# Patient Record
Sex: Male | Born: 1975 | Race: White | Hispanic: Yes | Marital: Single | Smoking: Light tobacco smoker
Health system: Southern US, Community
[De-identification: ages and names within clinical notes are randomized; demographics above are authoritative.]

## PROBLEM LIST (undated history)

## (undated) DIAGNOSIS — M999 Biomechanical lesion, unspecified: Secondary | ICD-10-CM

## (undated) DIAGNOSIS — F1910 Other psychoactive substance abuse, uncomplicated: Secondary | ICD-10-CM

## (undated) DIAGNOSIS — L820 Inflamed seborrheic keratosis: Secondary | ICD-10-CM

## (undated) DIAGNOSIS — K649 Unspecified hemorrhoids: Secondary | ICD-10-CM

## (undated) DIAGNOSIS — F329 Major depressive disorder, single episode, unspecified: Secondary | ICD-10-CM

## (undated) DIAGNOSIS — G2589 Other specified extrapyramidal and movement disorders: Secondary | ICD-10-CM

## (undated) DIAGNOSIS — J302 Other seasonal allergic rhinitis: Secondary | ICD-10-CM

## (undated) DIAGNOSIS — A691 Other Vincent's infections: Secondary | ICD-10-CM

## (undated) DIAGNOSIS — M25512 Pain in left shoulder: Secondary | ICD-10-CM

## (undated) DIAGNOSIS — F1911 Other psychoactive substance abuse, in remission: Secondary | ICD-10-CM

## (undated) DIAGNOSIS — Z720 Tobacco use: Secondary | ICD-10-CM

## (undated) DIAGNOSIS — Z202 Contact with and (suspected) exposure to infections with a predominantly sexual mode of transmission: Secondary | ICD-10-CM

## (undated) DIAGNOSIS — N529 Male erectile dysfunction, unspecified: Secondary | ICD-10-CM

## (undated) DIAGNOSIS — B359 Dermatophytosis, unspecified: Secondary | ICD-10-CM

## (undated) DIAGNOSIS — R6882 Decreased libido: Secondary | ICD-10-CM

## (undated) DIAGNOSIS — K140 Glossitis: Secondary | ICD-10-CM

## (undated) DIAGNOSIS — Z21 Asymptomatic human immunodeficiency virus [HIV] infection status: Secondary | ICD-10-CM

## (undated) DIAGNOSIS — B20 Human immunodeficiency virus [HIV] disease: Secondary | ICD-10-CM

## (undated) DIAGNOSIS — F411 Generalized anxiety disorder: Secondary | ICD-10-CM

## (undated) DIAGNOSIS — K6289 Other specified diseases of anus and rectum: Secondary | ICD-10-CM

## (undated) DIAGNOSIS — J069 Acute upper respiratory infection, unspecified: Secondary | ICD-10-CM

## (undated) DIAGNOSIS — H60399 Other infective otitis externa, unspecified ear: Secondary | ICD-10-CM

## (undated) DIAGNOSIS — IMO0002 Reserved for concepts with insufficient information to code with codable children: Secondary | ICD-10-CM

## (undated) DIAGNOSIS — L738 Other specified follicular disorders: Secondary | ICD-10-CM

## (undated) DIAGNOSIS — R000 Tachycardia, unspecified: Secondary | ICD-10-CM

## (undated) DIAGNOSIS — B370 Candidal stomatitis: Secondary | ICD-10-CM

## (undated) DIAGNOSIS — S4350XA Sprain of unspecified acromioclavicular joint, initial encounter: Secondary | ICD-10-CM

## (undated) DIAGNOSIS — L290 Pruritus ani: Secondary | ICD-10-CM

## (undated) HISTORY — DX: Candidal stomatitis: B37.0

## (undated) HISTORY — DX: Other psychoactive substance abuse, in remission: F19.11

## (undated) HISTORY — DX: Unspecified hemorrhoids: K64.9

## (undated) HISTORY — DX: Pruritus ani: L29.0

## (undated) HISTORY — DX: Acute upper respiratory infection, unspecified: J06.9

## (undated) HISTORY — DX: Pain in left shoulder: M25.512

## (undated) HISTORY — DX: Inflamed seborrheic keratosis: L82.0

## (undated) HISTORY — DX: Tachycardia, unspecified: R00.0

## (undated) HISTORY — DX: Dermatophytosis, unspecified: B35.9

## (undated) HISTORY — DX: Other infective otitis externa, unspecified ear: H60.399

## (undated) HISTORY — DX: Other seasonal allergic rhinitis: J30.2

## (undated) HISTORY — DX: Reserved for concepts with insufficient information to code with codable children: IMO0002

## (undated) HISTORY — DX: Other psychoactive substance abuse, uncomplicated: F19.10

## (undated) HISTORY — DX: Other specified diseases of anus and rectum: K62.89

## (undated) HISTORY — DX: Sprain of unspecified acromioclavicular joint, initial encounter: S43.50XA

## (undated) HISTORY — DX: Asymptomatic human immunodeficiency virus (hiv) infection status: Z21

## (undated) HISTORY — DX: Contact with and (suspected) exposure to infections with a predominantly sexual mode of transmission: Z20.2

## (undated) HISTORY — DX: Generalized anxiety disorder: F41.1

## (undated) HISTORY — DX: Other specified extrapyramidal and movement disorders: G25.89

## (undated) HISTORY — PX: TESTICLE TORSION REDUCTION: SHX795

## (undated) HISTORY — DX: Decreased libido: R68.82

## (undated) HISTORY — DX: Tobacco use: Z72.0

## (undated) HISTORY — DX: Glossitis: K14.0

## (undated) HISTORY — DX: Major depressive disorder, single episode, unspecified: F32.9

## (undated) HISTORY — DX: Male erectile dysfunction, unspecified: N52.9

## (undated) HISTORY — DX: Human immunodeficiency virus (HIV) disease: B20

## (undated) HISTORY — DX: Other Vincent's infections: A69.1

## (undated) HISTORY — DX: Biomechanical lesion, unspecified: M99.9

## (undated) HISTORY — DX: Other specified follicular disorders: L73.8

---

## 2009-02-28 ENCOUNTER — Encounter: Payer: Self-pay | Admitting: Internal Medicine

## 2009-02-28 LAB — CONVERTED CEMR LAB
HIV 1 RNA Quant: 29600 copies/mL
Hemoglobin: 15.1 g/dL
Hep B S Ab: REACTIVE
RPR Ser Ql: NONREACTIVE
WBC: 5.2 10*3/uL

## 2010-01-18 ENCOUNTER — Ambulatory Visit (HOSPITAL_COMMUNITY): Admission: EM | Admit: 2010-01-18 | Discharge: 2010-01-18 | Payer: Self-pay | Admitting: Emergency Medicine

## 2010-02-23 ENCOUNTER — Encounter: Payer: Self-pay | Admitting: Internal Medicine

## 2010-02-23 DIAGNOSIS — B20 Human immunodeficiency virus [HIV] disease: Secondary | ICD-10-CM | POA: Insufficient documentation

## 2010-02-24 ENCOUNTER — Ambulatory Visit: Payer: Self-pay | Admitting: Internal Medicine

## 2010-02-24 LAB — CONVERTED CEMR LAB
AST: 23 units/L (ref 0–37)
Alkaline Phosphatase: 68 units/L (ref 39–117)
CO2: 26 meq/L (ref 19–32)
Calcium: 9.1 mg/dL (ref 8.4–10.5)
Creatinine, Ser: 0.69 mg/dL (ref 0.40–1.50)
HCT: 46 % (ref 39.0–52.0)
HCV Ab: NEGATIVE
HDL: 47 mg/dL (ref >39)
HIV-1 antibody: POSITIVE — AB
HIV-2 Ab: NEGATIVE
Hemoglobin: 15.8 g/dL (ref 13.0–17.0)
Hepatitis B Surface Ag: NEGATIVE
Leukocytes, UA: NEGATIVE
MCV: 89.5 fL (ref 78.0–100.0)
Monocytes Relative: 10 % (ref 3–12)
Neutro Abs: 1.7 10*3/uL (ref 1.7–7.7)
Neutrophils Relative %: 37 % — ABNORMAL LOW (ref 43–77)
Potassium: 4 meq/L (ref 3.5–5.3)
Protein, ur: NEGATIVE mg/dL
RBC: 5.14 M/uL (ref 4.22–5.81)
RDW: 13.8 % (ref 11.5–15.5)
Total Bilirubin: 0.5 mg/dL (ref 0.3–1.2)
Total CHOL/HDL Ratio: 3.9
Urobilinogen, UA: 0.2 (ref 0.0–1.0)
pH: 8 (ref 5.0–8.0)

## 2010-02-28 ENCOUNTER — Encounter: Payer: Self-pay | Admitting: Internal Medicine

## 2010-03-09 ENCOUNTER — Ambulatory Visit: Payer: Self-pay | Admitting: Internal Medicine

## 2010-05-31 ENCOUNTER — Ambulatory Visit: Payer: Self-pay | Admitting: Internal Medicine

## 2010-05-31 LAB — CONVERTED CEMR LAB
ALT: 23 units/L (ref 0–53)
Albumin: 4.3 g/dL (ref 3.5–5.2)
Alkaline Phosphatase: 70 units/L (ref 39–117)
Basophils Relative: 0 % (ref 0–1)
Calcium: 9.4 mg/dL (ref 8.4–10.5)
Chloride: 98 meq/L (ref 96–112)
HCT: 42.7 % (ref 39.0–52.0)
Lymphs Abs: 2.9 10*3/uL (ref 0.7–4.0)
MCHC: 34.2 g/dL (ref 30.0–36.0)
MCV: 91.8 fL (ref 78.0–100.0)
Monocytes Absolute: 0.4 10*3/uL (ref 0.1–1.0)
Monocytes Relative: 9 % (ref 3–12)
Neutrophils Relative %: 29 % — ABNORMAL LOW (ref 43–77)
Platelets: 261 10*3/uL (ref 150–400)
RDW: 13.6 % (ref 11.5–15.5)
Total Protein: 7.9 g/dL (ref 6.0–8.3)
WBC: 4.9 10*3/uL (ref 4.0–10.5)

## 2010-06-14 ENCOUNTER — Ambulatory Visit: Payer: Self-pay | Admitting: Internal Medicine

## 2010-06-14 DIAGNOSIS — H60399 Other infective otitis externa, unspecified ear: Secondary | ICD-10-CM

## 2010-06-14 HISTORY — DX: Other infective otitis externa, unspecified ear: H60.399

## 2010-07-06 ENCOUNTER — Encounter (INDEPENDENT_AMBULATORY_CARE_PROVIDER_SITE_OTHER): Payer: Self-pay | Admitting: *Deleted

## 2010-10-02 ENCOUNTER — Telehealth: Payer: Self-pay | Admitting: Internal Medicine

## 2010-10-03 ENCOUNTER — Ambulatory Visit: Payer: Self-pay | Admitting: Adult Health

## 2010-10-03 ENCOUNTER — Encounter: Payer: Self-pay | Admitting: Internal Medicine

## 2010-10-03 ENCOUNTER — Encounter: Payer: Self-pay | Admitting: Adult Health

## 2010-10-03 DIAGNOSIS — K140 Glossitis: Secondary | ICD-10-CM

## 2010-10-03 DIAGNOSIS — A691 Other Vincent's infections: Secondary | ICD-10-CM

## 2010-10-03 DIAGNOSIS — K6289 Other specified diseases of anus and rectum: Secondary | ICD-10-CM | POA: Insufficient documentation

## 2010-10-03 HISTORY — DX: Other Vincent's infections: A69.1

## 2010-10-03 HISTORY — DX: Other specified diseases of anus and rectum: K62.89

## 2010-10-03 HISTORY — DX: Glossitis: K14.0

## 2010-10-03 LAB — CONVERTED CEMR LAB
ALT: 18 units/L (ref 0–53)
AST: 21 units/L (ref 0–37)
Albumin: 3.9 g/dL (ref 3.5–5.2)
Alkaline Phosphatase: 68 units/L (ref 39–117)
BUN: 13 mg/dL (ref 6–23)
Basophils Relative: 0 % (ref 0–1)
Chloride: 100 meq/L (ref 96–112)
Creatinine, Ser: 0.71 mg/dL (ref 0.40–1.50)
Glucose, Bld: 58 mg/dL — ABNORMAL LOW (ref 70–99)
HIV-1 RNA Quant, Log: 5.1 — ABNORMAL HIGH (ref <1.30)
Hemoglobin: 14.8 g/dL (ref 13.0–17.0)
Lymphs Abs: 1.4 10*3/uL (ref 0.7–4.0)
Monocytes Relative: 11 % (ref 3–12)
Neutro Abs: 1.7 10*3/uL (ref 1.7–7.7)
Neutrophils Relative %: 47 % (ref 43–77)
Platelets: 288 10*3/uL (ref 150–400)
RDW: 13.6 % (ref 11.5–15.5)
Total Protein: 8 g/dL (ref 6.0–8.3)

## 2010-10-11 ENCOUNTER — Ambulatory Visit
Admission: RE | Admit: 2010-10-11 | Discharge: 2010-10-11 | Payer: Self-pay | Source: Home / Self Care | Attending: Adult Health | Admitting: Adult Health

## 2010-10-11 DIAGNOSIS — L820 Inflamed seborrheic keratosis: Secondary | ICD-10-CM

## 2010-10-11 DIAGNOSIS — B370 Candidal stomatitis: Secondary | ICD-10-CM

## 2010-10-11 HISTORY — DX: Candidal stomatitis: B37.0

## 2010-10-11 HISTORY — DX: Inflamed seborrheic keratosis: L82.0

## 2010-10-19 ENCOUNTER — Encounter: Payer: Self-pay | Admitting: Internal Medicine

## 2010-10-24 ENCOUNTER — Ambulatory Visit
Admission: RE | Admit: 2010-10-24 | Discharge: 2010-10-24 | Payer: Self-pay | Source: Home / Self Care | Attending: Adult Health | Admitting: Adult Health

## 2010-10-24 DIAGNOSIS — L738 Other specified follicular disorders: Secondary | ICD-10-CM

## 2010-10-24 DIAGNOSIS — L290 Pruritus ani: Secondary | ICD-10-CM

## 2010-10-24 DIAGNOSIS — L678 Other hair color and hair shaft abnormalities: Secondary | ICD-10-CM

## 2010-10-24 DIAGNOSIS — B359 Dermatophytosis, unspecified: Secondary | ICD-10-CM

## 2010-10-24 HISTORY — DX: Other hair color and hair shaft abnormalities: L67.8

## 2010-10-24 HISTORY — DX: Dermatophytosis, unspecified: B35.9

## 2010-10-24 HISTORY — DX: Pruritus ani: L29.0

## 2010-10-24 HISTORY — DX: Other specified follicular disorders: L73.8

## 2010-10-30 ENCOUNTER — Telehealth (INDEPENDENT_AMBULATORY_CARE_PROVIDER_SITE_OTHER): Payer: Self-pay | Admitting: *Deleted

## 2010-10-31 ENCOUNTER — Ambulatory Visit
Admission: RE | Admit: 2010-10-31 | Discharge: 2010-10-31 | Payer: Self-pay | Source: Home / Self Care | Attending: Adult Health | Admitting: Adult Health

## 2010-10-31 DIAGNOSIS — F329 Major depressive disorder, single episode, unspecified: Secondary | ICD-10-CM

## 2010-10-31 DIAGNOSIS — J069 Acute upper respiratory infection, unspecified: Secondary | ICD-10-CM

## 2010-10-31 HISTORY — DX: Major depressive disorder, single episode, unspecified: F32.9

## 2010-10-31 HISTORY — DX: Acute upper respiratory infection, unspecified: J06.9

## 2010-11-06 ENCOUNTER — Telehealth: Payer: Self-pay | Admitting: Adult Health

## 2010-11-13 ENCOUNTER — Encounter: Payer: Self-pay | Admitting: Adult Health

## 2010-11-13 ENCOUNTER — Ambulatory Visit
Admission: RE | Admit: 2010-11-13 | Discharge: 2010-11-13 | Payer: Self-pay | Source: Home / Self Care | Attending: Adult Health | Admitting: Adult Health

## 2010-11-13 LAB — CONVERTED CEMR LAB
ALT: 24 units/L (ref 0–53)
AST: 28 units/L (ref 0–37)
Alkaline Phosphatase: 66 units/L (ref 39–117)
BUN: 11 mg/dL (ref 6–23)
Basophils Absolute: 0 10*3/uL (ref 0.0–0.1)
Basophils Relative: 0 % (ref 0–1)
CO2: 34 meq/L — ABNORMAL HIGH (ref 19–32)
Calcium: 9.7 mg/dL (ref 8.4–10.5)
Chloride: 96 meq/L (ref 96–112)
Creatinine, Ser: 0.76 mg/dL (ref 0.40–1.50)
Glucose, Bld: 107 mg/dL — ABNORMAL HIGH (ref 70–99)
HIV 1 RNA Quant: 56 copies/mL — ABNORMAL HIGH (ref <20)
Lymphocytes Relative: 45 % (ref 12–46)
Lymphs Abs: 2.2 10*3/uL (ref 0.7–4.0)
MCV: 90.2 fL (ref 78.0–100.0)
Monocytes Absolute: 0.3 10*3/uL (ref 0.1–1.0)
Neutro Abs: 1.9 10*3/uL (ref 1.7–7.7)
Sodium: 136 meq/L (ref 135–145)
Total Bilirubin: 0.4 mg/dL (ref 0.3–1.2)

## 2010-11-14 LAB — T-HELPER CELL (CD4) - (RCID CLINIC ONLY)
CD4 % Helper T Cell: 13 % — ABNORMAL LOW (ref 33–55)
CD4 T Cell Abs: 300 uL — ABNORMAL LOW (ref 400–2700)

## 2010-11-14 NOTE — Consult Note (Signed)
Summary: New Pt. Referral:  [facility] ID  New Pt. Referral:  [facility] ID   Imported By: Florinda Marker 03/15/2010 10:53:46  _____________________________________________________________________  External Attachment:    Type:   Image     Comment:   External Document

## 2010-11-14 NOTE — Miscellaneous (Signed)
Summary: HIPAA Restrictions  HIPAA Restrictions   Imported By: Florinda Marker 02/24/2010 15:30:31  _____________________________________________________________________  External Attachment:    Type:   Image     Comment:   External Document

## 2010-11-14 NOTE — Assessment & Plan Note (Signed)
Summary: f/u on labs/jc   CC:  follow-up visit and lab results.  History of Present Illness: Pt here to get the results of his blood work.  He c/o some drainage from his right ear.  No pain. He started a new regimen of vitamins for his HIV and does not want to start antiretrovirals at this time.  He would like to see if the vitamins are effective first.  Preventive Screening-Counseling & Management  Alcohol-Tobacco     Alcohol drinks/day: socially     Alcohol type: wine     Smoking Status: current     Packs/Day: 0.5     Year Started: 1999  Caffeine-Diet-Exercise     Caffeine use/day: coffee and tea      Does Patient Exercise: yes     Type of exercise: yoga, walking, jogging     Exercise (avg: min/session): >60     Times/week: 3  Safety-Violence-Falls     Seat Belt Use: yes      Sexual History:  n/a.        Drug Use:  former and heroin.        Blood Transfusions:  no.    Comments: pt. given condoms   Updated Prior Medication List: CORTISPORIN 3.5-10000-1 SOLN (NEOMYCIN-POLYMYXIN-HC) one drop 4 times a day  Current Allergies (reviewed today): No known allergies  Review of Systems  The patient denies fever, decreased hearing, and headaches.    Vital Signs:  Patient profile:   35 year old male Height:      67.5 inches (171.45 cm) Weight:      123.0 pounds (55.91 kg) BMI:     19.05 Temp:     97.5 degrees F (36.39 degrees C) oral Pulse rate:   73 / minute BP sitting:   123 / 78  (right arm)  Vitals Entered By: Wendall Mola CMA Duncan Dull) (June 14, 2010 10:07 AM) CC: follow-up visit, lab results Is Patient Diabetic? No Pain Assessment Patient in pain? no      Nutritional Status BMI of 19 -24 = normal Nutritional Status Detail appetite "good"  Does patient need assistance? Functional Status Self care Ambulation Normal Comments pt. started an herbal regimen of about 10 pills one month ago   Physical Exam  General:  alert, well-developed,  well-nourished, and well-hydrated.   Head:  normocephalic and atraumatic.   Ears:  R canal inflamed and R Canal drainage.   Lungs:  normal breath sounds.             Prevention For Positives: 06/14/2010   Safe sex practices discussed with patient. Condoms offered.                             Impression & Recommendations:  Problem # 1:  HIV INFECTION (ICD-042) Pt will return in 3 months for repeat labs.  He does not want to start antiretroviral therapy at this time.  Pneumovax refused. Diagnostics Reviewed:  HIV: REACTIVE (02/24/2010)   HIV-Western blot: Positive (02/24/2010)   CD4: 310 (06/01/2010)   WBC: 4.9 (05/31/2010)   Hgb: 14.6 (05/31/2010)   HCT: 42.7 (05/31/2010)   Platelets: 261 (05/31/2010) HIV genotype: See Comment (02/24/2010)   HIV-1 RNA: 175000 (05/31/2010)   HBSAg: NEG (02/24/2010)  Orders: Est. Patient Level III (99213)Future Orders: T-CD4SP ([facility]) (CD4SP) ... 09/12/2010 T-HIV Viral Load [id number]) ... 09/12/2010 T-Comprehensive Metabolic Panel [phone] [phone]) ... 09/12/2010 T-CBC w/Diff ([id number]) ... 09/12/2010  Problem # 2:  OTITIS EXTERNA (ICD-380.10)  His updated medication list for this problem includes:    Cortisporin 3.5-10000-1 Soln (Neomycin-polymyxin-hc) ..... One drop 4 times a day  Medications Added to Medication List This Visit: 1)  Cortisporin 3.5-10000-1 Soln (Neomycin-polymyxin-hc) .... One drop 4 times a day  Patient Instructions: 1)  Please schedule a follow-up appointment in 3 months, 2 weeks after labs.  Prescriptions: CORTISPORIN 3.5-10000-1 SOLN (NEOMYCIN-POLYMYXIN-HC) one drop 4 times a day  #44ml x 0   Entered and Authorized by:   Yisroel Ramming MD   Signed by:   Yisroel Ramming MD on 06/14/2010   Method used:   Print then Give to Patient   RxID:   [id number]
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
## 2010-11-14 NOTE — Miscellaneous (Signed)
Summary: Lab order/update  Clinical Lists Changes  Problems: Added new problem of HIV INFECTION (ICD-042) Orders: Added new Test order of T-Comprehensive Metabolic Panel [id number]) - Signed Added new Test order of T-CBC w/Diff [phone]) - Signed Added new Test order of T-CD4SP [facility]) (CD4SP) - Signed Added new Test order of T-HIV Viral Load [phone]) - Signed Added new Test order of T-Chlamydia  Probe, urine [id number]) - Signed Added new Test order of T-GC Probe, urine [phone]) - Signed Added new Test order of T-Hepatitis B Surface Antigen [phone]) - Signed Added new Test order of T-Hepatitis B Surface Antibody [id number]) - Signed Added new Test order of T-Hepatitis B Core Antibody ([id number]) - Signed Added new Test order of T-Hepatitis C Antibody ([id number]) - Signed Added new Test order of T-HIV Antibody  (Reflex) ([id number]) - Signed Added new Test order of T-HIV Ab Confirmatory Test/Western Blot ([id number]) - Signed Added new Test order of T-Lipid Profile ([id number]) - Signed Added new Test order of T-RPR (Syphilis) ([id number]) - Signed Added new Test order of T-Urinalysis ([id number]) - Signed Added new Test order of T-Hepatitis A Antibody ([id number]) - Signed Added new Test order of T-HIV1 Quant rflx Ultra or Genotype ([id number]) - Signed Observations: Added new observation of ANTI-HBS: reactive  (02/28/2009 14:13) Added new observation of GLUCOSE SER: 76 mg/dL (07/31/5101 58:52) Added new observation of ANTI-HBS: non reactive   (02/28/2009 14:10) Added new observation of HBSAG: non reactive   (02/28/2009 14:10) Added new observation of RPR: non reactive   (02/28/2009 14:10) Added new observation of WBC COUNT: 5.2 10*3/microliter (02/28/2009 14:10) Added new observation of HGB: 15.1 g/dL (77/82/4235 36:14) Added new observation of HIV1RNA QA: 29600 copies/mL (02/28/2009 14:10)         -  Date:  02/28/2009    Viral Load:  29600    Hemoglobin: 15.1    WBC: 5.2    RPR: non reactive     Hep B Surface Antigens: non reactive     Hep B Surface Antibodies: reactive     Hep B Surface Antibodies: non reactive     Glucose: 76

## 2010-11-14 NOTE — Miscellaneous (Signed)
Summary: RW update  Clinical Lists Changes  Observations: Added new observation of RWPARTICIP: Yes (07/06/2010 10:38)

## 2010-11-16 NOTE — Assessment & Plan Note (Signed)
Summary: Office Visit - Infectious Disease   History of Present Illness: Presents with c/o irritated "bump" in perianal region for past 2 weeks.  Denies drainage or pain at present.  Also c/o "itching ears" bilaterally, no drainage, bleeding or pain; denies tinnitus or decreased hearing acuity. Was diagnosed with ulcerative gingivitis and thinks he has thrush in hismouth.  Scheduled to see periodontist this week.  Has been off ARV therapy for "some time" having used "alternative therapies" to treat his HIV.    Current Allergies: No known allergies  Review of Systems General:  Denies chills, fatigue, fever, loss of appetite, malaise, sleep disorder, sweats, weakness, and weight loss. Eyes:  Denies blurring, discharge, double vision, eye irritation, eye pain, halos, itching, light sensitivity, red eye, vision loss-1 eye, and vision loss-both eyes. ENT:  Complains of ear discharge and earache; denies decreased hearing, difficulty swallowing, hoarseness, nasal congestion, nosebleeds, postnasal drainage, ringing in ears, sinus pressure, and sore throat; oral pain with "bumps" on tongue, some odynophagia.  c/o severe dental and gum pain. CV:  Denies bluish discoloration of lips or nails, chest pain or discomfort, difficulty breathing at night, difficulty breathing while lying down, fainting, fatigue, leg cramps with exertion, lightheadness, near fainting, palpitations, shortness of breath with exertion, swelling of feet, swelling of hands, and weight gain. Resp:  Denies chest discomfort, chest pain with inspiration, cough, coughing up blood, excessive snoring, hypersomnolence, morning headaches, pleuritic, shortness of breath, sputum productive, and wheezing. GI:  See HPI; Denies abdominal pain, bloody stools, change in bowel habits, constipation, dark tarry stools, diarrhea, excessive appetite, gas, hemorrhoids, indigestion, loss of appetite, nausea, vomiting, vomiting blood, and yellowish skin color. GU:   Denies decreased libido, discharge, dysuria, erectile dysfunction, genital sores, hematuria, incontinence, nocturia, urinary frequency, and urinary hesitancy. MS:  Denies joint pain, joint redness, joint swelling, loss of strength, low back pain, mid back pain, muscle aches, muscle , cramps, muscle weakness, stiffness, and thoracic pain. Derm:  Denies changes in color of skin, changes in nail beds, dryness, excessive perspiration, flushing, hair loss, insect bite(s), itching, lesion(s), poor wound healing, and rash. Neuro:  Denies brief paralysis, difficulty with concentration, disturbances in coordination, falling down, headaches, inability to speak, memory loss, numbness, poor balance, seizures, sensation of room spinning, tingling, tremors, visual disturbances, and weakness. Psych:  Denies alternate hallucination ( auditory/visual), anxiety, depression, easily angered, easily tearful, irritability, mental problems, panic attacks, sense of great danger, suicidal thoughts/plans, thoughts of violence, unusual visions or sounds, and thoughts /plans of harming others.  Physical Exam  General:  alert, well-developed, well-hydrated, appropriate dress, normal appearance, cooperative to examination, good hygiene, and underweight appearing.   Head:  Normocephalic and atraumatic without obvious abnormalities. No apparent alopecia or balding. Eyes:  No corneal or conjunctival inflammation noted. EOMI. Perrla. Funduscopic exam benign, without hemorrhages, exudates or papilledema. Vision grossly normal. Ears:  Canals both ears reddened but no exudates or drainage noted.  TM's clear Nose:  External nasal examination shows no deformity or inflammation. Nasal mucosa are pink and moist without lesions or exudates. Mouth:  Marked gingival ulceration and hypertrophy with dental roots showing.  halitosis, xerostomia, gingival inflammation, gingival recession, and glossitis.   Neck:  No deformities, masses, or  tenderness noted. Lungs:  Normal respiratory effort, chest expands symmetrically. Lungs are clear to auscultation, no crackles or wheezes. Heart:  Normal rate and regular rhythm. S1 and S2 normal without gallop, murmur, click, rub or other extra sounds. Abdomen:  Bowel sounds positive,abdomen soft and non-tender without  masses, organomegaly or hernias noted. Rectal:  Very small follicular lesion noted in poerianal area.  No hemorrhoids or verrucacious lesions noted. Genitalia:  Testes bilaterally descended without nodularity, tenderness or masses. No scrotal masses or lesions. No penis lesions or urethral discharge. Prostate:  Deferred Msk:  No deformity or scoliosis noted of thoracic or lumbar spine.   Pulses:  R and L carotid,radial,femoral,dorsalis pedis and posterior tibial pulses are full and equal bilaterally Extremities:  No clubbing, cyanosis, edema, or deformity noted with normal full range of motion of all joints.   Neurologic:  No cranial nerve deficits noted. Station and gait are normal. Plantar reflexes are down-going bilaterally. DTRs are symmetrical throughout. Sensory, motor and coordinative functions appear intact. Skin:  Some seborrheic hyperkeratosis to face noted. Psych:  Cognition and judgment appear intact. Alert and cooperative with normal attention span and concentration. No apparent delusions, illusions, hallucinations   Impression & Recommendations:  Problem # 1:  OTHER SPECIFIED DISORDER OF RECTUM AND ANUS (ICD-569.49) Isolated follicular lesion, discovered when nasked, he has been shaving rectal area with straight razor blade.  Instructed not to shave rectal area for now, cleanse area daily with antibacterial soap, and apply neosporin oint to area.  Will monitor on follow-up.  Problem # 2:  OTITIS EXTERNA (ICD-380.10) Will resume cortisporin otic, and add antipyrine-benzocaine to regimen and monitor response on f/u His updated medication list for this problem  includes:    Cortisporin 3.5-10000-1 Soln (Neomycin-polymyxin-hc) ..... One drop 4 times a day    Antipyrine-benzocaine 5.4-1.4 % Soln (Benzocaine-antipyrine) .Marland Kitchen... Fill ear canal with solution and place cotton in ears.  may repeat every 2 hours as needed.  Problem # 3:  GLOSSITIS (ICD-529.0) Could be both bacterial and fungal.  Will try Peridex mouthwash two times a day to see if this clears up condition.  To follow-up with periodontist this week.  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  At present, we will use Peridex as outlined in #3 above for this treatment and add doxycycline 100mg  by mouth q12h for treatment.  Problem # 5:  HIV INFECTION (ICD-042) Clearly his HIV is advancing in spite of his diligent use of alternative therapies.  He needs re-staging today with a follow-up in 2 weeks.  Will obtain CD4, VL, CMP, & CBC today. His updated medication list for this problem includes:    Doxycycline Hyclate 100 Mg Caps (Doxycycline hyclate) .Marland Kitchen... 1 cap by mouth every 12 hours
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
## 2010-11-16 NOTE — Assessment & Plan Note (Signed)
Summary: Anal problem /dde    Current Allergies: No known allergies   Other Orders: No Charge Patient Arrived (NCPA0) (NCPA0)

## 2010-11-16 NOTE — Miscellaneous (Signed)
Summary: Rw Update  Clinical Lists Changes  Observations: Added new observation of PAYOR: Private (10/19/2010 12:10) Added new observation of RACE: White (10/19/2010 12:10)

## 2010-11-16 NOTE — Progress Notes (Signed)
Summary: Anal problem  Phone Note Call from Patient Call back at Home Phone [phone]   Caller: Patient Call For: Yisroel Ramming MD Reason for Call: Acute Illness Action Taken: Phone Call Completed, Appt Scheduled Summary of Call: Problem with anus.  Requesting to be seen.  Appt. given for 10/03/10. Jennet Maduro RN  October 02, 2010 12:22 PM

## 2010-11-16 NOTE — Assessment & Plan Note (Signed)
Summary: 2wks [mkj]   History of Present Illness: Adherent with ARV's without missed doses.  Tolerating well.  CC of intense anal pruritis esp. @ HS, denies lesions or rash, also c/o irritation and redness to scrotum.  Denies lesions to scrotum.  c/o of stye in O.S. over the past 24 hrs.  No drainage or visual changes but irritating in nature.    Current Allergies: No known allergies  Vital Signs:  Patient profile:   35 year old male Height:      67.5 inches Weight:      123 pounds BMI:     19.05 BSA:     1.65 Temp:     98.3 degrees F oral Pulse rate:   93 / minute BP sitting:   123 / 78  (right arm) Is Patient Diabetic? No Pain Assessment Patient in pain? no      Nutritional Status BMI of < 19 = underweight Nutritional Status Detail normal  Have you ever been in a relationship where you felt threatened, hurt or afraid?No  Domestic Violence Intervention none  Does patient need assistance? Functional Status Self care Ambulation Normal   Physical Exam  General:  Well-developed,well-nourished,in no acute distress; alert,appropriate and cooperative throughout examination Head:  Normocephalic and atraumatic without obvious abnormalities. No apparent alopecia or balding. Eyes:  (+) stye noted at (L) inner canthus with local erythema noted but without drainage. Ears:  no drainage or erythema noted to TM's or external canals AU.  some sclerosis noted however to both canals Nose:  External nasal examination shows no deformity or inflammation. Nasal mucosa are pink and moist without lesions or exudates. Mouth:  `Glossitis markedly improved, on placques or exudates noted Neck:  No deformities, masses, or tenderness noted. Lungs:  Normal respiratory effort, chest expands symmetrically. Lungs are clear to auscultation, no crackles or wheezes.  Productive cough noted but no adventitious sounds auscultated. Heart:  Normal rate and regular rhythm. S1 and S2 normal without gallop,  murmur, click, rub or other extra sounds. Rectal:  no external abnormalities, no hemorrhoids, normal sphincter tone, no masses, no tenderness, no fissures, no fistulae, and no perianal rash.   Genitalia:  no hydrocele, no varicocele, no scrotal masses, no testicular masses or atrophy, no cutaneous lesions, and no urethral discharge.  Scrotal area slightly thickened and reddened.  No fissures or lesions noted Msk:  No deformity or scoliosis noted of thoracic or lumbar spine.   Skin:  (+) follicular rash to inner thighs. Psych:  Cognition and judgment appear intact. Alert and cooperative with normal attention span and concentration. No apparent delusions, illusions, hallucinations   Impression & Recommendations:  Problem # 1:  HIV INFECTION (ICD-042) Tolerating treatment well.  Will get staging labs in 2 weeks and f/u in 4 weeks with me. His updated medication list for this problem includes:    Doxycycline Hyclate 100 Mg Caps (Doxycycline hyclate) .Marland Kitchen... 1 cap by mouth every 12 hours    Fluconazole 100 Mg Tabs (Fluconazole) .Marland Kitchen... 1 tablet by mouth once daily for 10 days.    Smz-tmp Ds 800-160 Mg Tabs (Sulfamethoxazole-trimethoprim) .Marland Kitchen... 1 tablet by mouth once daily as directed  Orders: Est. Patient Level IV (99214)Future Orders: T-CBC w/Diff ([id number]) ... 11/07/2010 T-CD4SP ([facility]) (CD4SP) ... 11/07/2010 T-Comprehensive Metabolic Panel [id number]) ... 11/07/2010 T-HIV Viral Load [phone]) ... 11/07/2010  Problem # 2:  FOLLICULITIS (ICD-704.8) Mupirocin 2% ointment to affected areas two times a day for next week. Instructed to use sensitive ski n body wash  and avoid hot showers. Will re-eval on RTC. Orders: Est. Patient Level IV (95188)  Problem # 3:  CANDIDIASIS, ORAL (ICD-112.0) Assessment: Improved Will continue to monitor  Problem # 4:  PRURITUS ANI (ICD-698.0) Instructed to continue use of ketoconazole and hydrocortisone cream to area, especially at night time.  Will  monitor response. Orders: Est. Patient Level IV ([id number])  Problem # 5:  DERMATOPHYTOSIS OF UNSPECIFIED SITE (ICD-110.9) Scrotal area:  Most likely similar to #4, but has signs of inflammation.  We will treat accordingly with same regimen as planned in #4 and monitor response.  Meticulous hygeine with sensitive skin body wash and use of loos-fitting cotton clothing is recommended. Orders: Est. Patient Level IV (63016)  Problem # 6:  VINCENTS ANGINA (ICD-101) Assessment: Improved Continue f/u with periodontist.

## 2010-11-16 NOTE — Assessment & Plan Note (Signed)
Summary: issue?   Primary Provider:  Talmadge Chad NP  CC:  pt. c/o worsening depression, nasal and chest congestion, and dental clearance.  History of Present Illness: Presents with c/o increasing depressive mood not being helped with "normal" measures including yoga, meditation, and stress-reducing activities.  Describes no pleasure in normal activities for which he normally does.  Occassionally tearful, no suicidal thoughts or plans, does relate "low energy."  Remains adherent to ARV's.   Also recovering from sinus congestion and drainage, but states symptoms are improving.  Depression History:      The patient is having a depressed mood most of the day and has a diminished interest in his usual daily activities.        The patient denies that he feels like life is not worth living, denies that he wishes that he were dead, and denies that he has thought about ending his life.          Preventive Screening-Counseling & Management  Alcohol-Tobacco     Alcohol drinks/day: socially     Alcohol type: wine     Smoking Status: current     Packs/Day: 0.5     Year Started: 1999  Caffeine-Diet-Exercise     Caffeine use/day: coffee and tea      Does Patient Exercise: yes     Type of exercise: yoga, walking, jogging     Exercise (avg: min/session): >60     Times/week: 3  Safety-Violence-Falls     Seat Belt Use: yes      Sexual History:  n/a.        Drug Use:  former and heroin.        Blood Transfusions:  no.     Current Allergies (reviewed today): No known allergies  Review of Systems General:  Complains of malaise and sleep disorder; denies chills, fatigue, fever, and loss of appetite. Eyes:  Denies blurring, discharge, double vision, eye irritation, eye pain, halos, itching, light sensitivity, and red eye. ENT:  Complains of nasal congestion, postnasal drainage, and sinus pressure; denies decreased hearing, difficulty swallowing, ear discharge, earache, hoarseness,  nosebleeds, ringing in ears, and sore throat. CV:  Denies chest pain or discomfort, difficulty breathing at night, difficulty breathing while lying down, fainting, fatigue, leg cramps with exertion, and lightheadness. Resp:  Complains of cough and sputum productive; denies chest discomfort, chest pain with inspiration, coughing up blood, pleuritic, shortness of breath, and wheezing; Sputum described as clear and improving. GI:  Denies abdominal pain, nausea, and vomiting. Neuro:  Denies difficulty with concentration, disturbances in coordination, falling down, headaches, memory loss, numbness, and poor balance. Psych:  Complains of depression and easily tearful; denies alternate hallucination ( auditory/visual), anxiety, easily angered, irritability, mental problems, panic attacks, sense of great danger, suicidal thoughts/plans, thoughts of violence, unusual visions or sounds, and thoughts /plans of harming others.  Vital Signs:  Patient profile:   35 year old male Height:      67.5 inches (171.45 cm) Weight:      127.0 pounds (57.73 kg) BMI:     19.67 Temp:     97.6 degrees F (36.44 degrees C) oral Pulse rate:   69 / minute BP sitting:   125 / 83  (right arm)  Vitals Entered By: Wendall Mola CMA Duncan Dull) (October 31, 2010 9:08 AM) CC: pt. c/o worsening depression, nasal and chest congestion, dental clearance Is Patient Diabetic? No Pain Assessment Patient in pain? no      Nutritional Status BMI of  19 -24 = normal Nutritional Status Detail appetite "normal"  Have you ever been in a relationship where you felt threatened, hurt or afraid?No   Does patient need assistance? Functional Status Self care Ambulation Normal Comments no missed doses of HAART meds per pt.    Impression & Recommendations:  Problem # 1:  DEPRESSION, CHRONIC (ICD-311) Discussed in detail >20 minutes measures to address depression including mental health counselling.  After extensive discussion, it was  determined he is not a harm to himself. We will start citalopram 20mg  by mouth once daily to start and check response on his next scheduled visit.  If symptoms worsen, he has agreed to contact clinic.  Mental health counseling referral information provided for him. His updated medication list for this problem includes:    Citalopram Hydrobromide 20 Mg Tabs (Citalopram hydrobromide) .Marland Kitchen... 1 tab by mouth once daily  Orders: Est. Patient Level IV (57846)  Problem # 2:  URI (ICD-465.9) By self-report, his symptoms are improving.  We will monitor this and he is to contact the clinic if symptoms recure and/or worsen.  Ecnouraged to increase rest periods, ibuprofen for fever or pain, and increase fluid intake.  Medications Added to Medication List This Visit: 1)  Citalopram Hydrobromide 20 Mg Tabs (Citalopram hydrobromide) .Marland Kitchen.. 1 tab by mouth once daily Prescriptions: CITALOPRAM HYDROBROMIDE 20 MG TABS (CITALOPRAM HYDROBROMIDE) 1 tab by mouth once daily  #30 x 0   Entered and Authorized by:   Talmadge Chad NP   Signed by:   Talmadge Chad NP on 10/31/2010   Method used:   Print then Give to Patient   RxID:   [id number]

## 2010-11-16 NOTE — Progress Notes (Signed)
Summary: Wanting to discuss medication for depression  Phone Note Call from Patient   Caller: Patient Reason for Call: Acute Illness Summary of Call: In to see Financial Counselor today.  Expressed an interest in obtaining medication for depression.  To make appt. w/ B. [name] to discuss this issue.  To keep regularly scheduled appts for f/u.  Jennet Maduro RN  October 30, 2010 2:52 PM

## 2010-11-16 NOTE — Progress Notes (Signed)
Summary: pt. experienced "racing heart rate"  Phone Note Call from Patient Call back at Home Phone [phone]   Caller: Patient Call For: Talmadge Chad NP Reason for Call: Acute Illness Summary of Call: Message from pt.  Started taking citalopram.  Over the weekend he experience a "racing heart rate" which subsided on its own.  He wanted B. [name] to know that this occured because he read in the rx literature that this could be a side effect of the drug.  Wanted B. [name] to let him know what he should do.  Please advise pt. Jennet Maduro RN  November 06, 2010 4:25 PM   Follow-up for Phone Call        Retruned patient call.  As per nurse's phone, c/o new onset of palpitations after starting citalapram therapy.  Currently taking 20 mg by mouth once daily.  Instructed him to start taking med in a.m., using only 1/2 tablet (10 mg) to start and notify us if symptoms re-develop, otherwise we will monitor response on 10 mg once daily and increment accordingly.  Verbally acknowledged and agreed with plan.  Already scheduled to f/u next week with [place] in clinic.  Will evaluate status at that time. Follow-up by: Talmadge Chad NP,  January 23, 5:00 p.m.    New/Updated Medications: CITALOPRAM HYDROBROMIDE 20 MG TABS (CITALOPRAM HYDROBROMIDE) 1/2  tab by mouth once daily

## 2010-11-16 NOTE — Assessment & Plan Note (Signed)
Summary: discuss oral prob., seen by periodontist, and lab results    CC:  pt. c/o mouth sores and peridontis said it was fungul.  History of Present Illness: Returned to clinic on suggestion of periodontist who states he found more "thrush" in his mouth and requested differing management.  Also requesting results from labs drawn last week.  Perianal "sore" improved and "itching" in ears has improved.  Preventive Screening-Counseling & Management  Alcohol-Tobacco     Alcohol drinks/day: socially     Alcohol type: wine     Smoking Status: current     Packs/Day: 0.5     Year Started: 1999  Caffeine-Diet-Exercise     Caffeine use/day: coffee and tea      Does Patient Exercise: yes     Type of exercise: yoga, walking, jogging     Exercise (avg: min/session): >60     Times/week: 3  Safety-Violence-Falls     Seat Belt Use: yes      Sexual History:  n/a.        Drug Use:  former and heroin.        Blood Transfusions:  no.    Comments: pt. declined condoms   Current Allergies (reviewed today): No known allergies  Review of Systems  The patient denies anorexia, fever, weight loss, weight gain, vision loss, decreased hearing, hoarseness, chest pain, syncope, dyspnea on exertion, peripheral edema, prolonged cough, headaches, hemoptysis, abdominal pain, melena, hematochezia, severe indigestion/heartburn, hematuria, incontinence, genital sores, muscle weakness, suspicious skin lesions, transient blindness, difficulty walking, depression, unusual weight change, abnormal bleeding, enlarged lymph nodes, angioedema, breast masses, and testicular masses.    Vital Signs:  Patient profile:   35 year old male Height:      67.5 inches (171.45 cm) Weight:      128.8 pounds (58.55 kg) BMI:     19.95 Temp:     98.2 degrees F (36.78 degrees C) oral Pulse rate:   80 / minute BP sitting:   116 / 69  (right arm)  Vitals Entered By: Wendall Mola CMA Duncan Dull) (October 11, 2010 3:30  PM) CC: pt. c/o mouth sores, peridontis said it was fungul Is Patient Diabetic? No Pain Assessment Patient in pain? no      Nutritional Status BMI of 19 -24 = normal Nutritional Status Detail appetite "normal"  Have you ever been in a relationship where you felt threatened, hurt or afraid?No   Does patient need assistance? Functional Status Self care Ambulation Normal   Physical Exam  General:  alert, well-developed, well-nourished, and well-hydrated.   Head:  normocephalic and atraumatic.   Eyes:  No corneal or conjunctival inflammation noted. EOMI. Perrla.  Vision grossly normal. Ears:  Erythema clear in canals AU Mouth:  Glossitis on tongue improved, but still white placque coating noted, no additional placques on buccal or palate mucosa noted. Neck:  No deformities, masses, or tenderness noted. Lungs:  normal breath sounds.   Heart:  normal rate, regular rhythm, and no murmur.   Abdomen:  Bowel sounds positive,abdomen soft and non-tender without masses, organomegaly or hernias noted. Rectal:  Perianal follicular lesion cleared Pulses:  R and L carotid,radial,femoral,dorsalis pedis and posterior tibial pulses are full and equal bilaterally Neurologic:  No cranial nerve deficits noted. Station and gait are normal. Plantar reflexes are down-going bilaterally. DTRs are symmetrical throughout. Sensory, motor and coordinative functions appear intact. Skin:  seborrheic keratosis to face.   Cervical Nodes:  R anterior LN enlarged, R posterior LN enlarged, L  anterior LN enlarged, and L posterior LN enlarged.   Axillary Nodes:  Shoddy LAD bilaterally Psych:  Cognition and judgment appear intact. Alert and cooperative with normal attention span and concentration. No apparent delusions, illusions, hallucinations   Impression & Recommendations:  Problem # 1:  CANDIDIASIS, ORAL (ICD-112.0) Fluconazole 100 mg by mouth once daily x 10 days.  Follow-up response on RTC.  Problem # 2:   OTHER SPECIFIED DISORDER OF RECTUM AND ANUS (ICD-569.49) Assessment: Improved  Problem # 3:  OTITIS EXTERNA (ICD-380.10) Assessment: Improved  Problem # 4:  INFLAMED SEBORRHEIC KERATOSIS (ICD-702.11) Assessment: New Ketoconazole 2% cream to face two times a day plus Hydrocortisone 1% cream to face two times a day both to applied simoultaneously in sparing fashion.  Problem # 5:  HIV INFECTION (ICD-042) CD4 140 @ 11% with HIV RNA @ 126,000 copies/ml via PCR.  Spent > 20 minutes discussing possible reasons why his method did not work, and why currently HIV treatment is warranted.  Discussed possible ARV regimens, drug SE's, ADR's, and potential toxicities.  After a thourough review and discussion, we decided to start Isentress and Tuvada.  Also, discussed reasons for starting PCP prophylaxis.  Verbally acknowledged information, asked appropriate questions, and acknowledged understanding of answers.  He agreed with plan, Rx's written and co-pay assist cards were provided for him.  We plan to follow-up with him in 2 weeks to determine his treatment tolerance and response to treatments for other health issues. His updated medication list for this problem includes:    Doxycycline Hyclate 100 Mg Caps (Doxycycline hyclate) .Marland Kitchen... 1 cap by mouth every 12 hours    Fluconazole 100 Mg Tabs (Fluconazole) .Marland Kitchen... 1 tablet by mouth once daily for 10 days.    Smz-tmp Ds 800-160 Mg Tabs (Sulfamethoxazole-trimethoprim) .Marland Kitchen... 1 tablet by mouth once daily as directed  Orders: Est. Patient Level IV (25956)  Medications Added to Medication List This Visit: 1)  Isentress 400 Mg Tabs (Raltegravir potassium) .... Take one (1) tablet every twelve (12) hours 2)  Truvada 200-300 Mg Tabs (Emtricitabine-tenofovir) .... Take one (1) tablet once a day 3)  Fluconazole 100 Mg Tabs (Fluconazole) .Marland Kitchen.. 1 tablet by mouth once daily for 10 days. 4)  Ketoconazole 2 % Crea (Ketoconazole) .... Apply thin layer of cream to affected areas  two times a day as directed. 5)  Hydrocortisone 1 % Crea (Hydrocortisone) .... Apply thin layer to affected areas two times a day as directed. 6)  Smz-tmp Ds 800-160 Mg Tabs (Sulfamethoxazole-trimethoprim) .Marland Kitchen.. 1 tablet by mouth once daily as directed 7)  Mupirocin 2 % Oint (Mupirocin) .... Apply to affected area two times a day for 1 week  Patient Instructions: 1)  Stay out of direct sunlight while taking SMZ-TMP 2)  Apply both creams to affected areas sparingly and simoultaneously two times a day  3)  Take fluconazole until completely gone 4)  Take 1 Truvada tablet ONCE DAILY 5)  Take 1 Isentress 400mg  tablet EVERY TWELVE HOURS 6)  Please schedule a follow-up appointment in 2 weeks. 7)  Contact clinic if there are any questions or issues. Prescriptions: MUPIROCIN 2 % OINT (MUPIROCIN) Apply to affected area two times a day for 1 week  #30gm x 0   Entered and Authorized by:   Talmadge Chad NP   Signed by:   Talmadge Chad NP on 10/24/2010   Method used:   Print then Give to Patient   RxID:   [id number] SMZ-TMP DS 800-160 MG TABS (SULFAMETHOXAZOLE-TRIMETHOPRIM) 1 tablet  by mouth once daily as directed  #30 x 6   Entered and Authorized by:   Talmadge Chad NP   Signed by:   Talmadge Chad NP on 10/11/2010   Method used:   Print then Give to Patient   RxID:   [id number] HYDROCORTISONE 1 % CREA (HYDROCORTISONE) Apply thin layer to affected areas two times a day as directed.  #30 gm x 2   Entered and Authorized by:   Talmadge Chad NP   Signed by:   Talmadge Chad NP on 10/11/2010   Method used:   Print then Give to Patient   RxID:   [id number] KETOCONAZOLE 2 % CREA (KETOCONAZOLE) apply thin layer of cream to affected areas two times a day as directed.  #60 gm x 2   Entered and Authorized by:   Talmadge Chad NP   Signed by:   Talmadge Chad NP on 10/11/2010   Method used:   Print then Give to Patient   RxID:    [id number] FLUCONAZOLE 100 MG TABS (FLUCONAZOLE) 1 tablet by mouth once daily for 10 days.  #10 x 0   Entered and Authorized by:   Talmadge Chad NP   Signed by:   Talmadge Chad NP on 10/11/2010   Method used:   Print then Give to Patient   RxID:   [id number] TRUVADA 200-300 MG TABS (EMTRICITABINE-TENOFOVIR) Take one (1) tablet once a day  #30 x 5   Entered and Authorized by:   Talmadge Chad NP   Signed by:   Talmadge Chad NP on 10/11/2010   Method used:   Print then Give to Patient   RxID:   [id number] ISENTRESS 400 MG TABS (RALTEGRAVIR POTASSIUM) Take one (1) tablet every twelve (12) hours  #60 x 5   Entered and Authorized by:   Talmadge Chad NP   Signed by:   Talmadge Chad NP on 10/11/2010   Method used:   Print then Give to Patient   RxID:   [id number]

## 2010-11-20 ENCOUNTER — Ambulatory Visit (INDEPENDENT_AMBULATORY_CARE_PROVIDER_SITE_OTHER): Payer: BC Managed Care – PPO | Admitting: Adult Health

## 2010-11-20 DIAGNOSIS — F3289 Other specified depressive episodes: Secondary | ICD-10-CM

## 2010-11-20 DIAGNOSIS — Z23 Encounter for immunization: Secondary | ICD-10-CM

## 2010-11-20 DIAGNOSIS — F329 Major depressive disorder, single episode, unspecified: Secondary | ICD-10-CM

## 2010-11-20 DIAGNOSIS — B20 Human immunodeficiency virus [HIV] disease: Secondary | ICD-10-CM

## 2010-11-22 ENCOUNTER — Encounter (INDEPENDENT_AMBULATORY_CARE_PROVIDER_SITE_OTHER): Payer: Self-pay | Admitting: *Deleted

## 2010-11-23 ENCOUNTER — Encounter (INDEPENDENT_AMBULATORY_CARE_PROVIDER_SITE_OTHER): Payer: Self-pay | Admitting: *Deleted

## 2010-11-27 ENCOUNTER — Encounter (INDEPENDENT_AMBULATORY_CARE_PROVIDER_SITE_OTHER): Payer: Self-pay | Admitting: *Deleted

## 2010-11-29 ENCOUNTER — Encounter (INDEPENDENT_AMBULATORY_CARE_PROVIDER_SITE_OTHER): Payer: Self-pay | Admitting: *Deleted

## 2010-11-29 ENCOUNTER — Encounter: Payer: Self-pay | Admitting: Adult Health

## 2010-11-30 ENCOUNTER — Encounter (INDEPENDENT_AMBULATORY_CARE_PROVIDER_SITE_OTHER): Payer: Self-pay | Admitting: *Deleted

## 2010-11-30 NOTE — Miscellaneous (Signed)
  Clinical Lists Changes  Observations: Added new observation of LATINO/HISP: No (11/23/2010 15:22)

## 2010-11-30 NOTE — Miscellaneous (Signed)
  Clinical Lists Changes  Observations: Added new observation of INFECTDIS MD: Ninetta Lights (11/22/2010 10:15)

## 2010-11-30 NOTE — Miscellaneous (Signed)
  Clinical Lists Changes  Observations: Added new observation of INFECTDIS MD: Traci Sermon NP (11/22/2010 10:24)

## 2010-12-06 NOTE — Miscellaneous (Signed)
  Clinical Lists Changes  Observations: Added new observation of INCOMESOURCE: UNEMPLOYED (11/27/2010 16:04) Added new observation of HOUSEINCOME: 0  (11/27/2010 16:04) Added new observation of #CHILD<18 IN: No  (11/27/2010 16:04) Added new observation of FAMILYSIZE: 1  (11/27/2010 16:04) Added new observation of HOUSING: Stable/permanent  (11/27/2010 16:04) Added new observation of GENDER: Male  (11/27/2010 16:04) Added new observation of MARITAL STAT: Single  (11/27/2010 16:04) Added new observation of PAT[place]COUNTY: [place]  (11/27/2010 16:04)

## 2010-12-06 NOTE — Consult Note (Signed)
Summary: Dr. Lucky Cowboy 10/11/10  Dr. Lucky Cowboy 10/11/10   Imported By: Florinda Marker 11/29/2010 10:28:03  _____________________________________________________________________  External Attachment:    Type:   Image     Comment:   External Document

## 2010-12-06 NOTE — Miscellaneous (Signed)
  Clinical Lists Changes 

## 2010-12-12 NOTE — Miscellaneous (Signed)
  Clinical Lists Changes 

## 2010-12-21 ENCOUNTER — Encounter: Payer: Self-pay | Admitting: Licensed Clinical Social Worker

## 2010-12-25 ENCOUNTER — Encounter: Payer: Self-pay | Admitting: Adult Health

## 2010-12-25 LAB — T-HELPER CELL (CD4) - (RCID CLINIC ONLY): CD4 T Cell Abs: 140 uL — ABNORMAL LOW (ref 400–2700)

## 2010-12-26 ENCOUNTER — Encounter: Payer: Self-pay | Admitting: Adult Health

## 2010-12-29 ENCOUNTER — Telehealth: Payer: Self-pay | Admitting: *Deleted

## 2010-12-29 LAB — T-HELPER CELL (CD4) - (RCID CLINIC ONLY)
CD4 % Helper T Cell: 11 % — ABNORMAL LOW (ref 33–55)
CD4 T Cell Abs: 310 uL — ABNORMAL LOW (ref 400–2700)

## 2011-01-02 ENCOUNTER — Other Ambulatory Visit: Payer: Self-pay | Admitting: Adult Health

## 2011-01-02 ENCOUNTER — Other Ambulatory Visit: Payer: BC Managed Care – PPO

## 2011-01-02 LAB — CBC WITH DIFFERENTIAL/PLATELET
Eosinophils Absolute: 0.1 10*3/uL (ref 0.0–0.7)
Eosinophils Relative: 4 % (ref 0–5)
Hemoglobin: 14.2 g/dL (ref 13.0–17.0)
Lymphocytes Relative: 37 % (ref 12–46)
MCH: 32.1 pg (ref 26.0–34.0)
Monocytes Absolute: 0.4 10*3/uL (ref 0.1–1.0)
Neutro Abs: 2.1 10*3/uL (ref 1.7–7.7)
Neutrophils Relative %: 51 % (ref 43–77)

## 2011-01-02 LAB — T-HELPER CELL (CD4) - (RCID CLINIC ONLY)
CD4 % Helper T Cell: 12 % — ABNORMAL LOW (ref 33–55)
CD4 T Cell Abs: 300 uL — ABNORMAL LOW (ref 400–2700)

## 2011-01-02 NOTE — Progress Notes (Signed)
Summary: Razorburn-like rash on neck & face, improving  Phone Note Call from Patient Call back at Home Phone [phone]   Caller: Patient Reason for Call: Acute Illness, Talk to Nurse Summary of Call: Red "razor burn-like" rash appeared 12/28/10 on neck and face.  Around hairline appears chapped and flaky.  Pt. has taken Benadryl and applied cream that he was given earlier this year by the Center provider.  Rash appears better this AM.  Pt. reports using a different soap while showering 12/27/10.  Pt. has labwork appt. 01/02/11.  RN advised pt. 1) to continue with benadryl and cream since they appear to be working; 2) if rash worsens to stop HIV rxes; and  3) if rash worsens to call for provider appt. on Monday.  Pt. verbalized understanding. Jennet Maduro RN  December 29, 2010 10:04 AM

## 2011-01-03 LAB — URINALYSIS, ROUTINE W REFLEX MICROSCOPIC
Bilirubin Urine: NEGATIVE
Glucose, UA: NEGATIVE mg/dL
Ketones, ur: NEGATIVE mg/dL
Nitrite: NEGATIVE
Protein, ur: NEGATIVE mg/dL
Specific Gravity, Urine: 1.013 (ref 1.005–1.030)

## 2011-01-03 LAB — COMPLETE METABOLIC PANEL WITH GFR
ALT: 26 U/L (ref 0–53)
AST: 27 U/L (ref 0–37)
Alkaline Phosphatase: 62 U/L (ref 39–117)
BUN: 14 mg/dL (ref 6–23)
Calcium: 9.5 mg/dL (ref 8.4–10.5)
Chloride: 99 mEq/L (ref 96–112)
Creat: 0.82 mg/dL (ref 0.40–1.50)
Glucose, Bld: 136 mg/dL — ABNORMAL HIGH (ref 70–99)
Potassium: 4.2 mEq/L (ref 3.5–5.3)
Sodium: 135 mEq/L (ref 135–145)
Total Bilirubin: 0.3 mg/dL (ref 0.3–1.2)

## 2011-01-03 LAB — CBC
HCT: 42.2 % (ref 39.0–52.0)
Hemoglobin: 14.3 g/dL (ref 13.0–17.0)
MCHC: 34 g/dL (ref 30.0–36.0)
MCV: 93.3 fL (ref 78.0–100.0)
Platelets: 226 10*3/uL (ref 150–400)
RDW: 12.9 % (ref 11.5–15.5)

## 2011-01-03 LAB — T-HELPER CELL (CD4) - (RCID CLINIC ONLY): CD4 T Cell Abs: 370 uL — ABNORMAL LOW (ref 400–2700)

## 2011-01-03 LAB — BASIC METABOLIC PANEL
BUN: 10 mg/dL (ref 6–23)
GFR calc Af Amer: >60 mL/min (ref >60)
Potassium: 3.7 mEq/L (ref 3.5–5.1)
Sodium: 137 mEq/L (ref 135–145)

## 2011-01-05 ENCOUNTER — Telehealth: Payer: Self-pay | Admitting: *Deleted

## 2011-01-05 NOTE — Telephone Encounter (Signed)
States his allergies have been very bad, especially eye symptoms. States his dad, a doctor told him to get & use cromolyn. He wants [facility] to [facility] [address]. Has not been seen since January & has appt in about a wk. Told him I will send this request to NP. Wants to be called if sent or if unable to send w/o a visit. [phone].Frank Webster

## 2011-01-08 ENCOUNTER — Telehealth: Payer: Self-pay | Admitting: *Deleted

## 2011-01-08 NOTE — Telephone Encounter (Signed)
He has next appt on 4/3. Does not want to wait for rx that long. His eye allergy symptoms come & go. Bad today. Told him I will send message to provider. [facility] [address]

## 2011-01-09 NOTE — Telephone Encounter (Signed)
States he is still using the flonase which works well but does not help his eyes which itch & are red. Has not tried any otc drops.   Told him the provider mentions use of steroids as a possibility. Will call him back when provider responds . His new # is [phone]

## 2011-01-09 NOTE — Telephone Encounter (Signed)
Please contact Frank Webster and ask what meds he HAS tried for his allergies.  According to our records, he is taking fluticasone nasal spray.  If he is not using this, we should first try a steroid dose pak followed by resumption of fluticasone before proceeding to cromolyn.

## 2011-01-10 NOTE — Telephone Encounter (Signed)
He can have prednisone 5mg  qd x 5 days.

## 2011-01-11 ENCOUNTER — Telehealth: Payer: Self-pay | Admitting: *Deleted

## 2011-01-11 DIAGNOSIS — Z9109 Other allergy status, other than to drugs and biological substances: Secondary | ICD-10-CM

## 2011-01-11 MED ORDER — PREDNISONE 5 MG PO TABS: 5.0000 mg | ORAL_TABLET | Freq: Every day | ORAL | Status: AC

## 2011-01-11 NOTE — Telephone Encounter (Signed)
I called the med to [facility] on [address] for pt that he has a new rx to pick up. Call here for any questions

## 2011-01-11 NOTE — Telephone Encounter (Signed)
Order per NP reply in telephone note.  Pt. W[facility] to [facility] [address]. Jennet Maduro, RN

## 2011-01-15 ENCOUNTER — Telehealth: Payer: Self-pay | Admitting: Licensed Clinical Social Worker

## 2011-01-15 NOTE — Telephone Encounter (Signed)
Patient was expecting to get allergy eye drops and picked up prednisone from the pharmacy. He says his eyes are still itching and uncomfortable and would like allergy relief eye drops if possible.

## 2011-01-16 ENCOUNTER — Ambulatory Visit (INDEPENDENT_AMBULATORY_CARE_PROVIDER_SITE_OTHER): Payer: BC Managed Care – PPO | Admitting: Adult Health

## 2011-01-16 ENCOUNTER — Encounter: Payer: Self-pay | Admitting: Adult Health

## 2011-01-16 DIAGNOSIS — B359 Dermatophytosis, unspecified: Secondary | ICD-10-CM

## 2011-01-16 DIAGNOSIS — B20 Human immunodeficiency virus [HIV] disease: Secondary | ICD-10-CM

## 2011-01-27 ENCOUNTER — Emergency Department (HOSPITAL_COMMUNITY)
Admission: EM | Admit: 2011-01-27 | Discharge: 2011-01-27 | Disposition: A | Discharge status: Home / Self Care | Payer: BC Managed Care – PPO | Attending: Emergency Medicine | Admitting: Emergency Medicine

## 2011-01-27 ENCOUNTER — Emergency Department (HOSPITAL_COMMUNITY): Payer: BC Managed Care – PPO

## 2011-01-27 DIAGNOSIS — Y9355 Activity, bike riding: Secondary | ICD-10-CM | POA: Insufficient documentation

## 2011-01-27 DIAGNOSIS — M539 Dorsopathy, unspecified: Secondary | ICD-10-CM | POA: Insufficient documentation

## 2011-01-27 DIAGNOSIS — IMO0002 Reserved for concepts with insufficient information to code with codable children: Secondary | ICD-10-CM | POA: Insufficient documentation

## 2011-01-27 DIAGNOSIS — M542 Cervicalgia: Secondary | ICD-10-CM | POA: Insufficient documentation

## 2011-01-27 DIAGNOSIS — Z79899 Other long term (current) drug therapy: Secondary | ICD-10-CM | POA: Insufficient documentation

## 2011-01-27 DIAGNOSIS — Z21 Asymptomatic human immunodeficiency virus [HIV] infection status: Secondary | ICD-10-CM | POA: Insufficient documentation

## 2011-01-27 DIAGNOSIS — F17200 Nicotine dependence, unspecified, uncomplicated: Secondary | ICD-10-CM | POA: Insufficient documentation

## 2011-01-27 DIAGNOSIS — Y929 Unspecified place or not applicable: Secondary | ICD-10-CM | POA: Insufficient documentation

## 2011-01-27 DIAGNOSIS — M25559 Pain in unspecified hip: Secondary | ICD-10-CM | POA: Insufficient documentation

## 2011-01-27 LAB — CBC
MCH: 31.9 pg (ref 26.0–34.0)
Platelets: 151 10*3/uL (ref 150–400)
RBC: 4.26 MIL/uL (ref 4.22–5.81)

## 2011-01-27 LAB — COMPREHENSIVE METABOLIC PANEL
AST: 46 U/L — ABNORMAL HIGH (ref 0–37)
Albumin: 3.2 g/dL — ABNORMAL LOW (ref 3.5–5.2)
Calcium: 8.4 mg/dL (ref 8.4–10.5)
Chloride: 100 mEq/L (ref 96–112)
Creatinine, Ser: 0.69 mg/dL (ref 0.4–1.5)
GFR calc Af Amer: >60 mL/min (ref >60)
Total Bilirubin: 0.4 mg/dL (ref 0.3–1.2)

## 2011-01-27 LAB — DIFFERENTIAL
Basophils Absolute: 0.2 10*3/uL — ABNORMAL HIGH (ref 0.0–0.1)
Basophils Relative: 4 % — ABNORMAL HIGH (ref 0–1)
Lymphocytes Relative: 62 % — ABNORMAL HIGH (ref 12–46)
Monocytes Absolute: 0.5 10*3/uL (ref 0.1–1.0)
Neutro Abs: 1 10*3/uL — ABNORMAL LOW (ref 1.7–7.7)
Neutrophils Relative %: 23 % — ABNORMAL LOW (ref 43–77)

## 2011-01-27 LAB — URINALYSIS, ROUTINE W REFLEX MICROSCOPIC
Glucose, UA: NEGATIVE mg/dL
Hgb urine dipstick: NEGATIVE
Ketones, ur: 15 mg/dL — AB
Specific Gravity, Urine: 1.023 (ref 1.005–1.030)

## 2011-01-30 ENCOUNTER — Ambulatory Visit (INDEPENDENT_AMBULATORY_CARE_PROVIDER_SITE_OTHER): Payer: BC Managed Care – PPO | Admitting: Adult Health

## 2011-01-30 DIAGNOSIS — M2550 Pain in unspecified joint: Secondary | ICD-10-CM

## 2011-01-30 DIAGNOSIS — M791 Myalgia, unspecified site: Secondary | ICD-10-CM

## 2011-01-30 DIAGNOSIS — IMO0001 Reserved for inherently not codable concepts without codable children: Secondary | ICD-10-CM

## 2011-01-30 MED ORDER — HYDROCODONE-IBUPROFEN 5-200 MG PO TABS: 1.0000 | ORAL_TABLET | Freq: Four times a day (QID) | ORAL | Status: AC | PRN
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
## 2011-02-01 ENCOUNTER — Telehealth: Payer: Self-pay | Admitting: *Deleted

## 2011-02-01 DIAGNOSIS — B349 Viral infection, unspecified: Secondary | ICD-10-CM

## 2011-02-01 MED ORDER — OSELTAMIVIR PHOSPHATE 75 MG PO CAPS: 75.0000 mg | ORAL_CAPSULE | Freq: Two times a day (BID) | ORAL | Status: AC

## 2011-02-01 NOTE — Telephone Encounter (Signed)
[facility] [address] & notified the pt. He denied signs of dehydration. Drinking ok. Urine is yellow to pale yellow. Told him if vomiting or diarrhea or reduced intake, use ED. He verbalized understanding

## 2011-02-01 NOTE — Telephone Encounter (Signed)
States he still feels awful. Bed is soaks every am. Lots of pain. Wants to know what to do next. Told him I will check with the provider who saw him & will call him back with the answer

## 2011-03-23 ENCOUNTER — Telehealth: Payer: Self-pay | Admitting: *Deleted

## 2011-03-23 MED ORDER — EMTRICITABINE-TENOFOVIR DF 200-300 MG PO TABS: 1.0000 | ORAL_TABLET | Freq: Every day | ORAL | Status: DC

## 2011-03-23 NOTE — Telephone Encounter (Signed)
He asked for refills. Not sure how to get this. I checked his red file & called [facility] [phone]. I spoke with V[facility].  Pt was sent a card that simply presents to pharmacy. He was apptoved for 1 yr. The rx was only good for 6 months. Told him I will send it by E RX to [facility] [place]. He is to present the card & it will be paid. The ID on the card is [id number]. BIN is [id number] Group # is [id number]

## 2011-03-28 ENCOUNTER — Other Ambulatory Visit: Payer: Self-pay | Admitting: *Deleted

## 2011-03-28 DIAGNOSIS — B20 Human immunodeficiency virus [HIV] disease: Secondary | ICD-10-CM

## 2011-03-28 MED ORDER — RALTEGRAVIR POTASSIUM 400 MG PO TABS: 400.0000 mg | ORAL_TABLET | Freq: Two times a day (BID) | ORAL | Status: DC

## 2011-03-28 NOTE — Telephone Encounter (Signed)
rx mailed to [facility]  [address], [place] [address]. They had sent a fax asking for a new rx so they can keep sending him meds (isentress)

## 2011-04-11 ENCOUNTER — Other Ambulatory Visit: Payer: Self-pay | Admitting: *Deleted

## 2011-04-11 DIAGNOSIS — B20 Human immunodeficiency virus [HIV] disease: Secondary | ICD-10-CM

## 2011-04-11 MED ORDER — RALTEGRAVIR POTASSIUM 400 MG PO TABS: 400.0000 mg | ORAL_TABLET | Freq: Two times a day (BID) | ORAL | Status: DC

## 2011-04-17 ENCOUNTER — Telehealth: Payer: Self-pay | Admitting: *Deleted

## 2011-04-17 ENCOUNTER — Telehealth: Payer: Self-pay | Admitting: Adult Health

## 2011-04-17 NOTE — Telephone Encounter (Signed)
Script printed and signed.  Placed with application and given to K. Russell to process w/ PAP.  Jennet Maduro,. RN

## 2011-04-17 NOTE — Telephone Encounter (Signed)
Last week a refill Rx was faxed to the [facility] program, but they also needed the original mailed.  It has been mailed today.Frank Webster

## 2011-04-24 ENCOUNTER — Other Ambulatory Visit (INDEPENDENT_AMBULATORY_CARE_PROVIDER_SITE_OTHER): Payer: BC Managed Care – PPO

## 2011-04-24 DIAGNOSIS — B20 Human immunodeficiency virus [HIV] disease: Secondary | ICD-10-CM

## 2011-04-25 LAB — CBC WITH DIFFERENTIAL/PLATELET
Basophils Relative: 0 % (ref 0–1)
Eosinophils Absolute: 0.2 10*3/uL (ref 0.0–0.7)
Eosinophils Relative: 5 % (ref 0–5)
HCT: 44.7 % (ref 39.0–52.0)
Hemoglobin: 15.4 g/dL (ref 13.0–17.0)
MCH: 32 pg (ref 26.0–34.0)
MCHC: 34.5 g/dL (ref 30.0–36.0)
Monocytes Absolute: 0.3 10*3/uL (ref 0.1–1.0)
Monocytes Relative: 9 % (ref 3–12)

## 2011-04-25 LAB — COMPLETE METABOLIC PANEL WITH GFR
Albumin: 4.2 g/dL (ref 3.5–5.2)
Alkaline Phosphatase: 66 U/L (ref 39–117)
BUN: 11 mg/dL (ref 6–23)
GFR, Est Non African American: >60 mL/min (ref >60)
Glucose, Bld: 93 mg/dL (ref 70–99)
Total Bilirubin: 0.4 mg/dL (ref 0.3–1.2)

## 2011-04-25 LAB — T-HELPER CELL (CD4) - (RCID CLINIC ONLY)
CD4 % Helper T Cell: 29 % — ABNORMAL LOW (ref 33–55)
CD4 T Cell Abs: 440 uL (ref 400–2700)

## 2011-04-25 LAB — HIV-1 RNA QUANT-NO REFLEX-BLD: HIV 1 RNA Quant: <20 copies/mL (ref <20)

## 2011-05-14 ENCOUNTER — Ambulatory Visit (INDEPENDENT_AMBULATORY_CARE_PROVIDER_SITE_OTHER): Payer: BC Managed Care – PPO | Admitting: Adult Health

## 2011-05-14 ENCOUNTER — Encounter: Payer: Self-pay | Admitting: Adult Health

## 2011-05-14 VITALS — BP 134/84 | HR 71 | Temp 97.8°F | Ht 68.0 in | Wt 129.0 lb

## 2011-05-14 DIAGNOSIS — Z23 Encounter for immunization: Secondary | ICD-10-CM

## 2011-05-14 DIAGNOSIS — B20 Human immunodeficiency virus [HIV] disease: Secondary | ICD-10-CM

## 2011-05-14 DIAGNOSIS — Z0000 Encounter for general adult medical examination without abnormal findings: Secondary | ICD-10-CM

## 2011-05-14 NOTE — Patient Instructions (Signed)
CD4 is 440 at 29%. Viral load is less than 20 copies per mL (undetectable.)

## 2011-05-30 ENCOUNTER — Other Ambulatory Visit: Payer: Self-pay | Admitting: *Deleted

## 2011-05-30 DIAGNOSIS — Z889 Allergy status to unspecified drugs, medicaments and biological substances status: Secondary | ICD-10-CM

## 2011-05-30 MED ORDER — FLUTICASONE FUROATE 27.5 MCG/SPRAY NA SUSP: 2.0000 | Freq: Every day | NASAL | Status: DC

## 2011-06-01 ENCOUNTER — Other Ambulatory Visit: Payer: Self-pay | Admitting: Licensed Clinical Social Worker

## 2011-06-01 DIAGNOSIS — Z889 Allergy status to unspecified drugs, medicaments and biological substances status: Secondary | ICD-10-CM

## 2011-06-01 MED ORDER — FLUTICASONE FUROATE 27.5 MCG/SPRAY NA SUSP: 2.0000 | Freq: Every day | NASAL | Status: DC

## 2011-06-19 ENCOUNTER — Other Ambulatory Visit: Payer: Self-pay | Admitting: *Deleted

## 2011-06-19 DIAGNOSIS — F32A Depression, unspecified: Secondary | ICD-10-CM

## 2011-06-19 DIAGNOSIS — F329 Major depressive disorder, single episode, unspecified: Secondary | ICD-10-CM

## 2011-06-19 MED ORDER — CITALOPRAM HYDROBROMIDE 20 MG PO TABS: 10.0000 mg | ORAL_TABLET | Freq: Every day | ORAL | Status: DC

## 2011-08-23 ENCOUNTER — Other Ambulatory Visit (INDEPENDENT_AMBULATORY_CARE_PROVIDER_SITE_OTHER): Payer: BC Managed Care – PPO

## 2011-08-23 DIAGNOSIS — B20 Human immunodeficiency virus [HIV] disease: Secondary | ICD-10-CM

## 2011-08-23 LAB — CBC WITH DIFFERENTIAL/PLATELET
Basophils Relative: 0 % (ref 0–1)
Eosinophils Absolute: 0.2 10*3/uL (ref 0.0–0.7)
Eosinophils Relative: 5 % (ref 0–5)
Hemoglobin: 16 g/dL (ref 13.0–17.0)
MCH: 33.5 pg (ref 26.0–34.0)
MCHC: 35.6 g/dL (ref 30.0–36.0)
MCV: 94.1 fL (ref 78.0–100.0)
Monocytes Relative: 12 % (ref 3–12)
Neutrophils Relative %: 27 % — ABNORMAL LOW (ref 43–77)

## 2011-08-23 LAB — COMPLETE METABOLIC PANEL WITH GFR
Alkaline Phosphatase: 80 U/L (ref 39–117)
BUN: 9 mg/dL (ref 6–23)
Creat: 0.74 mg/dL (ref 0.50–1.35)
GFR, Est African American: >89 mL/min (ref >89)
GFR, Est Non African American: >89 mL/min (ref >89)
Glucose, Bld: 93 mg/dL (ref 70–99)
Sodium: 139 mEq/L (ref 135–145)
Total Bilirubin: 0.5 mg/dL (ref 0.3–1.2)
Total Protein: 8.4 g/dL — ABNORMAL HIGH (ref 6.0–8.3)

## 2011-08-24 LAB — T-HELPER CELL (CD4) - (RCID CLINIC ONLY)
CD4 % Helper T Cell: 27 % — ABNORMAL LOW (ref 33–55)
CD4 T Cell Abs: 570 uL (ref 400–2700)

## 2011-08-27 LAB — HIV-1 RNA QUANT-NO REFLEX-BLD: HIV 1 RNA Quant: NOT DETECTED copies/mL (ref <20)

## 2011-09-10 ENCOUNTER — Telehealth: Payer: Self-pay | Admitting: Internal Medicine

## 2011-09-10 NOTE — Telephone Encounter (Signed)
Patient called.  He is coming to to sign his applications.  Will be here 09-13-11.

## 2011-09-13 ENCOUNTER — Ambulatory Visit (INDEPENDENT_AMBULATORY_CARE_PROVIDER_SITE_OTHER): Payer: BC Managed Care – PPO | Admitting: Internal Medicine

## 2011-09-13 ENCOUNTER — Other Ambulatory Visit: Payer: Self-pay | Admitting: Licensed Clinical Social Worker

## 2011-09-13 ENCOUNTER — Encounter: Payer: Self-pay | Admitting: Internal Medicine

## 2011-09-13 DIAGNOSIS — L309 Dermatitis, unspecified: Secondary | ICD-10-CM

## 2011-09-13 DIAGNOSIS — L820 Inflamed seborrheic keratosis: Secondary | ICD-10-CM

## 2011-09-13 DIAGNOSIS — B20 Human immunodeficiency virus [HIV] disease: Secondary | ICD-10-CM

## 2011-09-13 DIAGNOSIS — Z79899 Other long term (current) drug therapy: Secondary | ICD-10-CM

## 2011-09-13 DIAGNOSIS — Z113 Encounter for screening for infections with a predominantly sexual mode of transmission: Secondary | ICD-10-CM

## 2011-09-13 DIAGNOSIS — L259 Unspecified contact dermatitis, unspecified cause: Secondary | ICD-10-CM

## 2011-09-13 DIAGNOSIS — Z0000 Encounter for general adult medical examination without abnormal findings: Secondary | ICD-10-CM

## 2011-09-13 DIAGNOSIS — Z23 Encounter for immunization: Secondary | ICD-10-CM

## 2011-09-13 DIAGNOSIS — H60399 Other infective otitis externa, unspecified ear: Secondary | ICD-10-CM

## 2011-09-13 MED ORDER — EMTRICITABINE-TENOFOVIR DF 200-300 MG PO TABS: 1.0000 | ORAL_TABLET | Freq: Every day | ORAL | Status: DC

## 2011-09-13 MED ORDER — RALTEGRAVIR POTASSIUM 400 MG PO TABS: 400.0000 mg | ORAL_TABLET | Freq: Two times a day (BID) | ORAL | Status: DC

## 2011-09-13 NOTE — Patient Instructions (Signed)
Fast for at least 8 hours prior to your next lab tests

## 2011-09-13 NOTE — Progress Notes (Signed)
  Subjective:    Patient ID: Frank Webster, male    DOB: [birth year], 35 y.o.   MRN: [id number]  HPI this patient comes in for routine follow up of his HIV.he was recently diagnosed and started on a regimen of Truvada and Isentress and has hadgood suppression of his virus. He does endorse occasional missed doses of the second dose of Isentress but otherwise has good adherence. He's had no recent problems. The only issue he said is problems with dermatitis on his face and redness over his nose and cheeks she has been a chronic issue. He otherwise denies any pruritus associated with it and has had some success in eliminating allergic foods and lotions but continues to have the problem. He states this has been going on for several months. He is no family history of any rheumatic diseases or lupus.    Review of Systems  Constitutional: Negative for fever, chills, appetite change, fatigue and unexpected weight change.  HENT: Negative for sore throat and trouble swallowing.   Respiratory: Negative for cough, shortness of breath and wheezing.   Gastrointestinal: Negative for nausea, abdominal pain, diarrhea and constipation.  Genitourinary: Negative for dysuria, frequency, hematuria, discharge and genital sores.  Musculoskeletal: Negative for myalgias and arthralgias.  Skin: Positive for rash.  Neurological: Negative for headaches.  Hematological: Negative for adenopathy.  Psychiatric/Behavioral: Negative for dysphoric mood.       Objective:   Physical Exam  Constitutional: He is oriented to person, place, and time. He appears well-developed and well-nourished. No distress.  HENT:  Mouth/Throat: Oropharynx is clear and moist. No oropharyngeal exudate.  Cardiovascular: Normal rate, regular rhythm and normal heart sounds.  Exam reveals no gallop and no friction rub.   No murmur heard. Pulmonary/Chest: Effort normal and breath sounds normal. No respiratory distress. He has no wheezes.    Lymphadenopathy:    He has no cervical adenopathy.  Neurological: He is alert and oriented to person, place, and time.  Skin: Skin is warm and dry. Rash noted. No erythema.  Psychiatric: He has a normal mood and affect. His behavior is normal.          Assessment & Plan:

## 2011-09-13 NOTE — Assessment & Plan Note (Signed)
He did have feeling of drainage in his right ear however his exam was benign. I did suggest Debrox for minimal ear wax that he had in his ear

## 2011-09-13 NOTE — Assessment & Plan Note (Signed)
He does have good effect with his medication for HIV. I have encouraged continued compliance. He will have screening lipid panel and other screening labs next visit. I did remind him of condom use was also showing activity. He will return in 4 months time for routine followup and was told to follow up sooner if he develops any problems.

## 2011-09-13 NOTE — Assessment & Plan Note (Signed)
He does have continued papillary erythematous rash on his face that comes and goes. Does not appear consistent with a drug rash and is proceed starting his medications. He will be referred to dermatology.

## 2011-09-17 ENCOUNTER — Telehealth: Payer: Self-pay | Admitting: Internal Medicine

## 2011-09-17 NOTE — Telephone Encounter (Signed)
Faxed application to [facility] for Isentress and faxed and mailed application for Truvada to [facility] today.

## 2011-09-24 ENCOUNTER — Telehealth: Payer: Self-pay | Admitting: Internal Medicine

## 2011-09-24 NOTE — Telephone Encounter (Signed)
Called [employer] to check status of the application.  He has been approved until  09-17-12.  Called patient and left him a message telling him he has been approved.

## 2011-10-01 ENCOUNTER — Telehealth: Payer: Self-pay | Admitting: *Deleted

## 2011-10-01 NOTE — Telephone Encounter (Signed)
He left a message that he needs proof of MMR & Td for school. None in our records. Told him I will try the state registry. Unable to access. I left a message for family practice to plz call me about getting a new password for [facility] & to please look him up in [place] & fax any records here.

## 2011-10-02 NOTE — Telephone Encounter (Signed)
I LM that [facility] did not have any records of his immunizations. Asked that he call me back.

## 2011-10-29 ENCOUNTER — Telehealth: Payer: Self-pay | Admitting: Internal Medicine

## 2011-10-29 NOTE — Telephone Encounter (Signed)
Received fax concerning renewal application for  [facility].  Renewal was mailed today 10-29-11

## 2011-11-06 ENCOUNTER — Other Ambulatory Visit: Payer: Self-pay | Admitting: *Deleted

## 2011-11-06 DIAGNOSIS — B20 Human immunodeficiency virus [HIV] disease: Secondary | ICD-10-CM

## 2011-11-06 MED ORDER — EMTRICITABINE-TENOFOVIR DF 200-300 MG PO TABS: 1.0000 | ORAL_TABLET | Freq: Every day | ORAL | Status: DC

## 2011-11-06 NOTE — Telephone Encounter (Signed)
He stated the med was not there. [facility]. I called him & told him that. He will go get it

## 2011-11-08 NOTE — Progress Notes (Signed)
Patient ID: Frank Webster, male   DOB: 02/19/1976, 36 y.o.   MRN: [id number] Subjective:    Patient ID: Frank Webster is a 36 y.o. male.  Chief Complaint: HIV Follow-up Visit [name] is here for follow-up of HIV infection. He is feeling unchanged since his last visit.  He claims continued adherence to therapy with good tolerance and no complications. There are additional complaints. States rash in peri-anal and inguinal area not improved, but he did admit that he did not apply topicals simultaneously as was instructed.  Data Review: Diagnostic studies reviewed.  Review of Systems - General ROS: negative for - fatigue, fever, night sweats or sleep disturbance Psychological ROS: negative for - anxiety, behavioral disorder, concentration difficulties, depression or mood swings Respiratory ROS: no cough, shortness of breath, or wheezing Cardiovascular ROS: no chest pain or dyspnea on exertion Gastrointestinal ROS: no abdominal pain, change in bowel habits, or black or bloody stools Neurological ROS: no TIA or stroke symptoms Dermatological ROS: positive for lumps, pruritus and rash negative for skin lesion changes  Objective:  Head: Normocephalic, without obvious abnormality, atraumatic Neck: no adenopathy, no carotid bruit, no JVD, supple, symmetrical, trachea midline and thyroid not enlarged, symmetric, no tenderness/mass/nodules Resp: clear to auscultation bilaterally Cardio: regular rate and rhythm, S1, S2 normal, no murmur, click, rub or gallop GI: soft, non-tender; bowel sounds normal; no masses,  no organomegaly Genitalia: penis exam: rash mild erythmatous inguinal rash Neurologic: Grossly normal Skin:  Small, papular rash noted Psych:   Laboratory: [id number] CD4 was 370 @ 24% with VL <20 copies/ml        Assessment:/Plan:    Medication adherence discussed with patient. Follow up visit in 3 months with labs 2 weeks prior to appointment.   Frank Webster A.  Frank Aland, MS, [facility] [phone]  11/08/2011, 11:53 AM

## 2011-11-13 ENCOUNTER — Telehealth: Payer: Self-pay | Admitting: Internal Medicine

## 2011-11-13 NOTE — Telephone Encounter (Signed)
Called the [facility] concerning his Isentress.  Said eveything looks good.  Patient should be notified by the end of the week.

## 2011-11-14 ENCOUNTER — Telehealth: Payer: Self-pay | Admitting: Internal Medicine

## 2011-11-14 NOTE — Telephone Encounter (Signed)
Called patient and told him I received a letter from the [facility].  They have denied his application stating that his insurance covers Isentress.  I told him to return my call.

## 2011-11-14 NOTE — Telephone Encounter (Signed)
Frank Webster left me a VM stating someone from the [facility] had also called him.  Told him he qualifies for extended help.  He will receive his Isentress.

## 2011-11-15 NOTE — Assessment & Plan Note (Signed)
Rash to inguinal and peri-anal areas appear unchanged, but it is uncertain whether he was taking both creams as ordered. Encouraged to continue present management, avoid shaving in area for now as this increases skin irritation for him, and contact us if problem worsens.

## 2011-11-15 NOTE — Assessment & Plan Note (Signed)
Clinically stable on current regimen. Continue present management.

## 2011-11-21 ENCOUNTER — Telehealth: Payer: Self-pay | Admitting: Internal Medicine

## 2011-11-21 NOTE — Telephone Encounter (Signed)
Received fax from [facility] with an Attestation form that needs to be filled out and returned.  Will call patient and tell him.  Left VM

## 2011-11-27 NOTE — Progress Notes (Signed)
Subjective:    Patient ID: Frank Webster is a 36 y.o. male.  Chief Complaint: HIV Follow-up Visit Frank Webster is here for follow-up of HIV infection. He is feeling better since his last visit.  He claims continued adherence to therapy with good tolerance and no complications. There are not additional complaints.   Data Review: Diagnostic studies reviewed.  Review of Systems - General ROS: negative Psychological ROS: negative Ophthalmic ROS: negative ENT ROS: negative Respiratory ROS: no cough, shortness of breath, or wheezing Cardiovascular ROS: no chest pain or dyspnea on exertion Gastrointestinal ROS: no abdominal pain, change in bowel habits, or black or bloody stools Genito-Urinary ROS: no dysuria, trouble voiding, or hematuria Neurological ROS: no TIA or stroke symptoms Dermatological ROS: negative  Objective:   General appearance: alert, cooperative and no distress Head: Normocephalic, without obvious abnormality, atraumatic Eyes: conjunctivae/corneas clear. PERRL, EOM's intact. Fundi benign. Throat: lips, mucosa, and tongue normal; teeth and gums normal Resp: clear to auscultation bilaterally Cardio: regular rate and rhythm, S1, S2 normal, no murmur, click, rub or gallop GI: soft, non-tender; bowel sounds normal; no masses,  no organomegaly Skin: Skin color, texture, turgor normal. No rashes or lesions Neurologic: Alert and oriented X 3, normal strength and tone. Normal symmetric reflexes. Normal coordination and gait Psych:  No vegetative signs or delusional behaviors noted.    Laboratory: From 04/24/2011 ,  CD4 count was 440 c/cmm @ 29 %. Viral load <20 copies/ml.     Assessment/Plan:   HIV INFECTION Clinically stable on current regimen. Continue present management. Discontinue Bactrim PCP prophylaxis.  Counseling provided on prevention of transmission of HIV. Condoms offered:  Refused  , states he has his own Medication adherence discussed with  patient. Referrals: None Follow up visit in 4 months with labs 2 weeks prior to appointment. Patient verbally acknowledged information provided to them and agreed with plan of care.      Seung Nidiffer A. [name], MS, [facility] for Infectious Disease [phone]  11/27/2011, 1:45 PM

## 2011-11-27 NOTE — Assessment & Plan Note (Signed)
Clinically stable on current regimen. Continue present management. Discontinue Bactrim PCP prophylaxis.  Counseling provided on prevention of transmission of HIV. Condoms offered:  Refused  , states he has his own Medication adherence discussed with patient. Referrals: None Follow up visit in 4 months with labs 2 weeks prior to appointment. Patient verbally acknowledged information provided to them and agreed with plan of care.

## 2011-12-06 ENCOUNTER — Telehealth: Payer: Self-pay | Admitting: Internal Medicine

## 2011-12-06 NOTE — Telephone Encounter (Signed)
Received fax from the [facility].  Frank Webster qualifies for the program for his Isentress.  He being notified by the [facility] of his acceptance.

## 2011-12-10 ENCOUNTER — Telehealth: Payer: Self-pay | Admitting: *Deleted

## 2011-12-10 NOTE — Telephone Encounter (Signed)
He had gone thru the pap process to get his isentress but it has not yet been delivered. We do not have any sample bottles. He is unable to buy a small supply of this & will wait for it to co68me. Advised to not take the other hiv med until he has both drugs. He agreed with plan

## 2012-03-03 ENCOUNTER — Other Ambulatory Visit: Payer: Self-pay | Admitting: *Deleted

## 2012-03-03 DIAGNOSIS — B20 Human immunodeficiency virus [HIV] disease: Secondary | ICD-10-CM

## 2012-03-03 MED ORDER — EMTRICITABINE-TENOFOVIR DF 200-300 MG PO TABS: 1.0000 | ORAL_TABLET | Freq: Every day | ORAL | Status: DC

## 2012-03-03 MED ORDER — RALTEGRAVIR POTASSIUM 400 MG PO TABS: 400.0000 mg | ORAL_TABLET | Freq: Two times a day (BID) | ORAL | Status: DC

## 2012-03-06 ENCOUNTER — Other Ambulatory Visit: Payer: BC Managed Care – PPO

## 2012-03-06 DIAGNOSIS — Z79899 Other long term (current) drug therapy: Secondary | ICD-10-CM

## 2012-03-06 DIAGNOSIS — B20 Human immunodeficiency virus [HIV] disease: Secondary | ICD-10-CM

## 2012-03-06 DIAGNOSIS — Z113 Encounter for screening for infections with a predominantly sexual mode of transmission: Secondary | ICD-10-CM

## 2012-03-07 LAB — CBC WITH DIFFERENTIAL/PLATELET
Basophils Absolute: 0 10*3/uL (ref 0.0–0.1)
Basophils Relative: 0 % (ref 0–1)
HCT: 44.2 % (ref 39.0–52.0)
Hemoglobin: 15.1 g/dL (ref 13.0–17.0)
Lymphocytes Relative: 54 % — ABNORMAL HIGH (ref 12–46)
MCHC: 34.2 g/dL (ref 30.0–36.0)
Monocytes Absolute: 0.3 10*3/uL (ref 0.1–1.0)
Monocytes Relative: 9 % (ref 3–12)
Neutro Abs: 1.4 10*3/uL — ABNORMAL LOW (ref 1.7–7.7)
Neutrophils Relative %: 35 % — ABNORMAL LOW (ref 43–77)
WBC: 3.9 10*3/uL — ABNORMAL LOW (ref 4.0–10.5)

## 2012-03-07 LAB — COMPLETE METABOLIC PANEL WITH GFR
Alkaline Phosphatase: 69 U/L (ref 39–117)
BUN: 19 mg/dL (ref 6–23)
CO2: 29 mEq/L (ref 19–32)
Creat: 0.77 mg/dL (ref 0.50–1.35)
GFR, Est African American: >89 mL/min
GFR, Est Non African American: >89 mL/min
Glucose, Bld: 95 mg/dL (ref 70–99)
Total Bilirubin: 0.5 mg/dL (ref 0.3–1.2)

## 2012-03-07 LAB — RPR

## 2012-03-07 LAB — LIPID PANEL
Cholesterol: 173 mg/dL (ref 0–200)
Triglycerides: 161 mg/dL — ABNORMAL HIGH (ref <150)
VLDL: 32 mg/dL (ref 0–40)

## 2012-03-18 ENCOUNTER — Telehealth: Payer: Self-pay | Admitting: Internal Medicine

## 2012-04-14 ENCOUNTER — Ambulatory Visit (INDEPENDENT_AMBULATORY_CARE_PROVIDER_SITE_OTHER): Payer: BC Managed Care – PPO | Admitting: Internal Medicine

## 2012-04-14 ENCOUNTER — Encounter: Payer: Self-pay | Admitting: Internal Medicine

## 2012-04-14 VITALS — BP 127/83 | HR 66 | Temp 97.6°F | Ht 68.0 in | Wt 135.0 lb

## 2012-04-14 DIAGNOSIS — F329 Major depressive disorder, single episode, unspecified: Secondary | ICD-10-CM

## 2012-04-14 DIAGNOSIS — F32A Depression, unspecified: Secondary | ICD-10-CM

## 2012-04-14 DIAGNOSIS — B20 Human immunodeficiency virus [HIV] disease: Secondary | ICD-10-CM

## 2012-04-14 DIAGNOSIS — F3289 Other specified depressive episodes: Secondary | ICD-10-CM

## 2012-04-14 MED ORDER — CITALOPRAM HYDROBROMIDE 20 MG PO TABS: 20.0000 mg | ORAL_TABLET | Freq: Every day | ORAL | Status: DC

## 2012-04-14 NOTE — Patient Instructions (Signed)
Take ibuprofen, 400 mg twice a day for one week for your shoulder

## 2012-04-14 NOTE — Assessment & Plan Note (Signed)
He has noted some increased depression though not nearly as bad as he had depression in early 2012. I will increase his citalopram and he is going to seek counseling to determine the type of depression and if medication is appropriate. I will continue to prescribe the citalopram if needed since he likely will be seen a psychologist.

## 2012-04-14 NOTE — Progress Notes (Signed)
  Subjective:    Patient ID: Frank Webster, male    DOB: [birth year], 36 y.o.   MRN: [id number]  HPI He comes in for followup of his HIV. He has been on by Isentress and Truvada and has been taking it well.  He does though tell me that he missed 3 days because he had trouble getting it mailed to him and was out of town finally got to him. This was after his last lab visit. She otherwise has no problems taking the Isentres twice daily and Truvada daily.  He also endorses some increase in his depression and has a counseling appointment this week. He has continued to take citalopram which he has been on since January of 2012 and does feel that it helped significantly. He though has had more depression and less interest in activities. Otherwise he does see a dermatologist for his seborrheic dermatitis.   Review of Systems  Constitutional: Negative for fever, chills, fatigue and unexpected weight change.  HENT: Negative for sore throat and trouble swallowing.   Respiratory: Negative for cough and shortness of breath.   Cardiovascular: Negative for chest pain, palpitations and leg swelling.  Gastrointestinal: Negative for nausea, abdominal pain and diarrhea.  Musculoskeletal: Negative for myalgias, joint swelling and arthralgias.  Skin:       Mild facial erythema with his seborrheic dermatitis  Neurological: Negative for dizziness, weakness and headaches.  Hematological: Negative for adenopathy.  Psychiatric/Behavioral: Positive for dysphoric mood. The patient is not nervous/anxious.        Objective:   Physical Exam  Constitutional: He appears well-developed and well-nourished. No distress.  HENT:  Mouth/Throat: Oropharynx is clear and moist. No oropharyngeal exudate.  Cardiovascular: Normal rate, regular rhythm and normal heart sounds.  Exam reveals no gallop and no friction rub.   No murmur heard. Pulmonary/Chest: Effort normal and breath sounds normal. No respiratory distress. He has no  wheezes. He has no rales.  Abdominal: Soft. Bowel sounds are normal. He exhibits no distension. There is no tenderness. There is no rebound.  Lymphadenopathy:    He has no cervical adenopathy.  Skin:       Mild facial seborrheic  Psychiatric: He has a normal mood and affect. His behavior is normal.          Assessment & Plan:

## 2012-04-14 NOTE — Assessment & Plan Note (Addendum)
He is doing well with his current treatment. Though with his recent missed doses, I will have him recheck his labs in 2 months. I also will check that time in regards to his depression. He was offered and given condoms. I have provided him phone numbers to establish with a primary care physician as well.

## 2012-05-05 ENCOUNTER — Other Ambulatory Visit: Payer: Self-pay | Admitting: Internal Medicine

## 2012-06-04 ENCOUNTER — Other Ambulatory Visit: Payer: Self-pay | Admitting: Licensed Clinical Social Worker

## 2012-06-04 DIAGNOSIS — B20 Human immunodeficiency virus [HIV] disease: Secondary | ICD-10-CM

## 2012-06-04 MED ORDER — RALTEGRAVIR POTASSIUM 400 MG PO TABS: 400.0000 mg | ORAL_TABLET | Freq: Two times a day (BID) | ORAL | Status: DC

## 2012-06-10 ENCOUNTER — Telehealth: Payer: Self-pay | Admitting: Internal Medicine

## 2012-06-10 NOTE — Telephone Encounter (Signed)
Faxed and mailed new prescription to the [facility] for Frank Webster' Isentress.

## 2012-06-17 ENCOUNTER — Other Ambulatory Visit: Payer: BC Managed Care – PPO

## 2012-06-18 ENCOUNTER — Encounter (HOSPITAL_COMMUNITY): Payer: Self-pay | Admitting: *Deleted

## 2012-06-18 ENCOUNTER — Emergency Department (HOSPITAL_COMMUNITY)
Admission: EM | Admit: 2012-06-18 | Discharge: 2012-06-18 | Disposition: A | Discharge status: Home / Self Care | Payer: BC Managed Care – PPO | Source: Home / Self Care | Attending: Emergency Medicine | Admitting: Emergency Medicine

## 2012-06-18 DIAGNOSIS — H01009 Unspecified blepharitis unspecified eye, unspecified eyelid: Secondary | ICD-10-CM

## 2012-06-18 DIAGNOSIS — H01005 Unspecified blepharitis left lower eyelid: Secondary | ICD-10-CM

## 2012-06-18 DIAGNOSIS — L309 Dermatitis, unspecified: Secondary | ICD-10-CM

## 2012-06-18 DIAGNOSIS — L259 Unspecified contact dermatitis, unspecified cause: Secondary | ICD-10-CM

## 2012-06-18 MED ORDER — TOBRAMYCIN 0.3 % OP SOLN: 1.0000 [drp] | Freq: Four times a day (QID) | OPHTHALMIC | Status: AC

## 2012-06-18 MED ORDER — TOBRAMYCIN 0.3 % OP SOLN: 1.0000 [drp] | Freq: Four times a day (QID) | OPHTHALMIC | Status: DC

## 2012-06-18 NOTE — ED Notes (Signed)
Pt  Reports  Yesterday    He  Noticed  Some  Drainage   From  Eyes   With  Some  puffyness  Under l  Eye    -  He also has  A  Red  irriated  cruty area   Around   Nose        -  He  Is  Sitting upright on  Exam table  In no  Severe  Distress

## 2012-06-18 NOTE — ED Provider Notes (Signed)
History     CSN: [id number]  Arrival date & time 06/18/12  1534   First MD Initiated Contact with Patient 06/18/12 1607      Chief Complaint  Patient presents with  . Rash    (Consider location/radiation/quality/duration/timing/severity/associated sxs/prior treatment) HPI Comments: Woke up this morning with swelling and puffiness under my Left lower eyelid, with crusted material and also a rash to both sides of his nose. "I always have a congested and runny nose"  Patient is a 36 y.o. male presenting with rash. The history is provided by the patient.  Rash  This is a new problem. The current episode started yesterday. The problem has not changed since onset.There has been no fever. The fever has been present for less than 1 day. The pain is at a severity of 0/10. The patient is experiencing no pain. The pain has been constant since onset. Pertinent negatives include no blisters and no pain. He has tried nothing for the symptoms.    Past Medical History  Diagnosis Date  . HIV infection   . Depression     History reviewed. No pertinent past surgical history.  Family History  Problem Relation Age of Onset  . Arthritis Neg Hx   . Lupus Neg Hx   . Rosacea Neg Hx     History  Substance Use Topics  . Smoking status: Current Everyday Smoker -- 0.5 packs/day    Types: Cigarettes  . Smokeless tobacco: Never Used  . Alcohol Use: 1.5 oz/week    3 drink(s) per week      Review of Systems  Constitutional: Negative for chills, activity change and appetite change.  HENT: Positive for congestion and rhinorrhea. Negative for neck stiffness.   Skin: Positive for rash.    Allergies  Review of patient's allergies indicates not on file.  Home Medications   Current Outpatient Rx  Name Route Sig Dispense Refill  . CITALOPRAM HYDROBROMIDE 20 MG PO TABS Oral Take 1 tablet (20 mg total) by mouth daily. 30 tablet 6  . FLUTICASONE FUROATE 27.5 MCG/SPRAY NA SUSP Nasal Place 2 sprays  into the nose daily. Each nostril 10 g 5  . RALTEGRAVIR POTASSIUM 400 MG PO TABS Oral Take 1 tablet (400 mg total) by mouth 2 (two) times daily. 60 tablet 6  . TOBRAMYCIN SULFATE 0.3 % OP SOLN Left Eye Place 1 drop into the left eye every 6 (six) hours. 5 mL 0  . TRUVADA 200-300 MG PO TABS  TAKE 1 TABLET BY MOUTH DAILY. 30 tablet 1    BP 114/71  Pulse 73  Temp 98 F (36.7 C)  SpO2 99%  Physical Exam  Nursing note and vitals reviewed. Constitutional: He appears well-developed and well-nourished.  HENT:  Head:    Eyes: Conjunctivae and EOM are normal. Pupils are equal, round, and reactive to light. No foreign bodies found. Right eye exhibits no discharge. Left eye exhibits no discharge. No scleral icterus.    Neurological: He is alert.  Skin: Rash noted. There is erythema.    ED Course  Procedures (including critical care time)  Labs Reviewed - No data to display No results found.   1. Blepharitis of left lower eyelid   2. Dermatitis       MDM  Blepharitis of LLE, most likely allergenic- with co-existent seborrheic dermatitis peri-nasally- Rx tobramycin opthalmic- to take antihistamine over the counter as in zyrtec- or allegra        Jimmie Molly, MD 06/18/12 1651

## 2012-06-19 ENCOUNTER — Other Ambulatory Visit: Payer: Self-pay | Admitting: *Deleted

## 2012-06-19 DIAGNOSIS — B20 Human immunodeficiency virus [HIV] disease: Secondary | ICD-10-CM

## 2012-06-19 MED ORDER — RALTEGRAVIR POTASSIUM 400 MG PO TABS: 400.0000 mg | ORAL_TABLET | Freq: Two times a day (BID) | ORAL | Status: DC

## 2012-06-24 ENCOUNTER — Other Ambulatory Visit: Payer: BC Managed Care – PPO

## 2012-06-24 DIAGNOSIS — B20 Human immunodeficiency virus [HIV] disease: Secondary | ICD-10-CM

## 2012-06-25 LAB — COMPLETE METABOLIC PANEL WITH GFR
AST: 32 U/L (ref 0–37)
Albumin: 4.2 g/dL (ref 3.5–5.2)
BUN: 13 mg/dL (ref 6–23)
Calcium: 9.4 mg/dL (ref 8.4–10.5)
Chloride: 101 mEq/L (ref 96–112)
Potassium: 4.5 mEq/L (ref 3.5–5.3)

## 2012-06-25 LAB — HIV-1 RNA QUANT-NO REFLEX-BLD: HIV-1 RNA Quant, Log: 3.51 {Log} — ABNORMAL HIGH (ref <1.30)

## 2012-06-25 LAB — CBC WITH DIFFERENTIAL/PLATELET
Basophils Absolute: 0 10*3/uL (ref 0.0–0.1)
HCT: 44.4 % (ref 39.0–52.0)
Hemoglobin: 15.1 g/dL (ref 13.0–17.0)
Lymphocytes Relative: 56 % — ABNORMAL HIGH (ref 12–46)
Lymphs Abs: 2.1 10*3/uL (ref 0.7–4.0)
Monocytes Absolute: 0.6 10*3/uL (ref 0.1–1.0)
Monocytes Relative: 16 % — ABNORMAL HIGH (ref 3–12)
Neutro Abs: 1 10*3/uL — ABNORMAL LOW (ref 1.7–7.7)
RBC: 4.7 MIL/uL (ref 4.22–5.81)
RDW: 14.4 % (ref 11.5–15.5)
WBC: 3.7 10*3/uL — ABNORMAL LOW (ref 4.0–10.5)

## 2012-06-25 LAB — T-HELPER CELL (CD4) - (RCID CLINIC ONLY): CD4 T Cell Abs: 400 uL (ref 400–2700)

## 2012-06-26 ENCOUNTER — Telehealth: Payer: Self-pay | Admitting: *Deleted

## 2012-06-26 NOTE — Telephone Encounter (Signed)
Called patient and left voice mail to call the clinic. Wendall Mola CMA

## 2012-06-26 NOTE — Telephone Encounter (Signed)
Message copied by Macy Mis on Thu Jun 26, 2012  1:52 PM ------      Message from: Gardiner Barefoot      Created: Thu Jun 26, 2012 12:08 PM       Can you ask him if he is taking his medicine and if he is, add on genotype and have him stop.  thanks

## 2012-06-30 ENCOUNTER — Telehealth: Payer: Self-pay | Admitting: Internal Medicine

## 2012-06-30 NOTE — Telephone Encounter (Signed)
Called [employer] to check status on Frank Webster' Isentress.   The prescription was faxed on 8-27 and the original was received on 9-5.  There should not be a lag in the time it the medication is received now.

## 2012-07-01 ENCOUNTER — Ambulatory Visit: Payer: BC Managed Care – PPO | Admitting: Internal Medicine

## 2012-07-03 ENCOUNTER — Ambulatory Visit: Payer: BC Managed Care – PPO | Admitting: Internal Medicine

## 2012-07-17 ENCOUNTER — Other Ambulatory Visit: Payer: Self-pay | Admitting: Internal Medicine

## 2012-07-31 ENCOUNTER — Other Ambulatory Visit: Payer: BC Managed Care – PPO

## 2012-08-14 ENCOUNTER — Ambulatory Visit: Payer: BC Managed Care – PPO | Admitting: Internal Medicine

## 2012-08-26 ENCOUNTER — Other Ambulatory Visit: Payer: Self-pay | Admitting: Licensed Clinical Social Worker

## 2012-08-26 DIAGNOSIS — B20 Human immunodeficiency virus [HIV] disease: Secondary | ICD-10-CM

## 2012-08-26 MED ORDER — EMTRICITABINE-TENOFOVIR DF 200-300 MG PO TABS: 1.0000 | ORAL_TABLET | Freq: Every day | ORAL | Status: DC

## 2012-08-26 MED ORDER — RALTEGRAVIR POTASSIUM 400 MG PO TABS: 400.0000 mg | ORAL_TABLET | Freq: Two times a day (BID) | ORAL | Status: DC

## 2012-09-01 ENCOUNTER — Telehealth: Payer: Self-pay | Admitting: Internal Medicine

## 2012-09-01 NOTE — Telephone Encounter (Signed)
Faxed copy of notarized letter of no income to [employer] today

## 2012-09-04 ENCOUNTER — Telehealth: Payer: Self-pay | Admitting: Internal Medicine

## 2012-09-04 NOTE — Telephone Encounter (Signed)
Received call from [employer].  Mr. Arnott has been approved for his Truvada

## 2012-09-15 ENCOUNTER — Other Ambulatory Visit (INDEPENDENT_AMBULATORY_CARE_PROVIDER_SITE_OTHER): Payer: BC Managed Care – PPO

## 2012-09-15 DIAGNOSIS — B20 Human immunodeficiency virus [HIV] disease: Secondary | ICD-10-CM

## 2012-09-15 LAB — CBC WITH DIFFERENTIAL/PLATELET
Basophils Relative: 0 % (ref 0–1)
Eosinophils Absolute: 0.2 10*3/uL (ref 0.0–0.7)
Eosinophils Relative: 4 % (ref 0–5)
HCT: 48.6 % (ref 39.0–52.0)
Hemoglobin: 16.7 g/dL (ref 13.0–17.0)
MCH: 32.1 pg (ref 26.0–34.0)
MCHC: 34.4 g/dL (ref 30.0–36.0)
Monocytes Absolute: 0.4 10*3/uL (ref 0.1–1.0)
Monocytes Relative: 9 % (ref 3–12)
Neutrophils Relative %: 34 % — ABNORMAL LOW (ref 43–77)

## 2012-09-15 LAB — COMPREHENSIVE METABOLIC PANEL
Alkaline Phosphatase: 67 U/L (ref 39–117)
BUN: 14 mg/dL (ref 6–23)
Creat: 0.8 mg/dL (ref 0.50–1.35)
Glucose, Bld: 127 mg/dL — ABNORMAL HIGH (ref 70–99)
Total Bilirubin: 0.7 mg/dL (ref 0.3–1.2)

## 2012-09-16 LAB — T-HELPER CELL (CD4) - (RCID CLINIC ONLY): CD4 T Cell Abs: 780 uL (ref 400–2700)

## 2012-09-16 LAB — HIV-1 RNA QUANT-NO REFLEX-BLD: HIV-1 RNA Quant, Log: 1.93 {Log} — ABNORMAL HIGH (ref <1.30)

## 2012-09-19 ENCOUNTER — Other Ambulatory Visit: Payer: Self-pay | Admitting: *Deleted

## 2012-09-19 DIAGNOSIS — B20 Human immunodeficiency virus [HIV] disease: Secondary | ICD-10-CM

## 2012-09-19 MED ORDER — EMTRICITABINE-TENOFOVIR DF 200-300 MG PO TABS: 1.0000 | ORAL_TABLET | Freq: Every day | ORAL | Status: DC

## 2012-09-29 ENCOUNTER — Encounter: Payer: Self-pay | Admitting: Internal Medicine

## 2012-09-29 ENCOUNTER — Other Ambulatory Visit: Payer: Self-pay | Admitting: *Deleted

## 2012-09-29 ENCOUNTER — Ambulatory Visit (INDEPENDENT_AMBULATORY_CARE_PROVIDER_SITE_OTHER): Payer: BC Managed Care – PPO | Admitting: Internal Medicine

## 2012-09-29 VITALS — BP 133/86 | HR 87 | Temp 98.7°F | Ht 68.0 in | Wt 135.0 lb

## 2012-09-29 DIAGNOSIS — K649 Unspecified hemorrhoids: Secondary | ICD-10-CM | POA: Insufficient documentation

## 2012-09-29 DIAGNOSIS — B20 Human immunodeficiency virus [HIV] disease: Secondary | ICD-10-CM

## 2012-09-29 DIAGNOSIS — Z23 Encounter for immunization: Secondary | ICD-10-CM

## 2012-09-29 HISTORY — DX: Unspecified hemorrhoids: K64.9

## 2012-09-29 MED ORDER — RALTEGRAVIR POTASSIUM 400 MG PO TABS: 400.0000 mg | ORAL_TABLET | Freq: Two times a day (BID) | ORAL | Status: DC

## 2012-09-29 MED ORDER — HYDROCORTISONE 2.5 % RE CREA: TOPICAL_CREAM | Freq: Two times a day (BID) | RECTAL | Status: DC

## 2012-09-29 MED ORDER — EMTRICITABINE-TENOFOVIR DF 200-300 MG PO TABS: 1.0000 | ORAL_TABLET | Freq: Every day | ORAL | Status: DC

## 2012-09-29 NOTE — Assessment & Plan Note (Signed)
i will give him symptomatic treatment

## 2012-09-29 NOTE — Assessment & Plan Note (Signed)
He will continue with his current medications and he is doing well on this. I will have him followup though in 3 months since he has only recently been back on the medications after missing some doses.

## 2012-09-29 NOTE — Progress Notes (Signed)
  Subjective:    Patient ID: Frank Webster, male    DOB: 03-Feb-1976, 36 y.o.   MRN: [id number]  HPI He comes in for followup of his HIV. He has been on Truvada and Isentress and reports excellent compliance. The main problem though has been receiving his medication and timely fashion through the mail order which required by the drug assistance program. He did have a period where he missed a week and then another period where he admits to about 2 weeks. His last viral load prior to this month was in fact over 9000. He though now returns with a viral load of just 86 with a good CD4 count. He has no complaints and does have good compliance.  His good tolerance the medications and is pleased with the regimen even though this twice a day. Since he does get drug assistance from the company, he is happy to continue with this regimen.  He also has a complaint of a bump on his perirectal area on the right side. He's noticed that for several months. He also has noted some itching and sometimes some pain in the area though no significant pain. Also sounds notice some bright red blood on the toilet tissue but not a significant amount, more drops.   Review of Systems  Constitutional: Negative for fever, chills and fatigue.  HENT: Negative for sore throat and trouble swallowing.   Respiratory: Negative for cough and shortness of breath.   Cardiovascular: Negative for chest pain, palpitations and leg swelling.  Gastrointestinal: Negative for nausea, abdominal pain and diarrhea.  Musculoskeletal: Negative for myalgias, joint swelling and arthralgias.  Skin: Negative for rash.  Neurological: Negative for dizziness and headaches.       Objective:   Physical Exam  Constitutional: He appears well-developed and well-nourished. No distress.  HENT:  Mouth/Throat: Oropharynx is clear and moist. No oropharyngeal exudate.  Cardiovascular: Normal rate, regular rhythm and normal heart sounds.  Exam reveals no  gallop and no friction rub.   No murmur heard. Pulmonary/Chest: Effort normal and breath sounds normal. No respiratory distress. He has no wheezes. He has no rales.  Genitourinary:       + 2mm skin lesion or right perianal area, skin color, smooth  Skin: No rash noted.          Assessment & Plan:

## 2012-09-30 ENCOUNTER — Telehealth: Payer: Self-pay | Admitting: Internal Medicine

## 2012-09-30 NOTE — Telephone Encounter (Signed)
Mailed new prescription to [facility] today

## 2012-12-01 ENCOUNTER — Telehealth: Payer: Self-pay | Admitting: Internal Medicine

## 2012-12-01 NOTE — Telephone Encounter (Signed)
Called Frank Webster and left a message telling him to return my call.  We need to set up an appointment to re-apply to the Support Program.  They will not accept the application we sent in early.

## 2012-12-11 ENCOUNTER — Telehealth: Payer: Self-pay | Admitting: Internal Medicine

## 2012-12-11 NOTE — Telephone Encounter (Signed)
Called Frank Webster and left a v/m.  He will be receiving a letter from [employer] [facility] that needs to be signed and dated.  He needs to return as soon as possible to prevent any interruption in his Isentress.

## 2012-12-25 ENCOUNTER — Other Ambulatory Visit (INDEPENDENT_AMBULATORY_CARE_PROVIDER_SITE_OTHER): Payer: BC Managed Care – PPO

## 2012-12-25 ENCOUNTER — Other Ambulatory Visit: Payer: Self-pay | Admitting: Infectious Disease

## 2012-12-25 ENCOUNTER — Other Ambulatory Visit: Payer: Self-pay | Admitting: Internal Medicine

## 2012-12-25 DIAGNOSIS — B20 Human immunodeficiency virus [HIV] disease: Secondary | ICD-10-CM

## 2012-12-28 LAB — HIV-1 RNA QUANT-NO REFLEX-BLD
HIV 1 RNA Quant: <20 copies/mL (ref <20)
HIV-1 RNA Quant, Log: <1.3 {Log} (ref <1.30)

## 2013-01-08 ENCOUNTER — Ambulatory Visit (INDEPENDENT_AMBULATORY_CARE_PROVIDER_SITE_OTHER): Payer: BC Managed Care – PPO | Admitting: Internal Medicine

## 2013-01-08 ENCOUNTER — Encounter: Payer: Self-pay | Admitting: Internal Medicine

## 2013-01-08 VITALS — BP 129/77 | HR 77 | Temp 97.4°F | Ht 68.0 in | Wt 134.0 lb

## 2013-01-08 DIAGNOSIS — B20 Human immunodeficiency virus [HIV] disease: Secondary | ICD-10-CM

## 2013-01-08 DIAGNOSIS — Z79899 Other long term (current) drug therapy: Secondary | ICD-10-CM

## 2013-01-08 DIAGNOSIS — Z113 Encounter for screening for infections with a predominantly sexual mode of transmission: Secondary | ICD-10-CM

## 2013-01-08 DIAGNOSIS — K649 Unspecified hemorrhoids: Secondary | ICD-10-CM

## 2013-01-08 MED ORDER — HYDROCORTISONE ACETATE 25 MG RE SUPP: 25.0000 mg | Freq: Two times a day (BID) | RECTAL | Status: DC

## 2013-01-08 NOTE — Assessment & Plan Note (Signed)
He is going to try symptomatic therapy though he is not having any significant discomfort from it. He though will attempt to reduce it with Anusol and if it persists I will consider sending him to surgery

## 2013-01-08 NOTE — Progress Notes (Signed)
  Subjective:    Patient ID: Frank Webster, male    DOB: [birth year], 37 y.o.   MRN: [id number]  HPI He comes in for follow up of his HIV. He continues on Isentress and Truvada and denies any missed doses. He feels well. He does continue to complain of a hemorrhoid. He did not fill the Anusol prescription last visit.   Review of Systems  Constitutional: Negative for fatigue.  Gastrointestinal: Negative for abdominal pain, diarrhea and constipation.  Musculoskeletal: Negative for myalgias, joint swelling and arthralgias.  Skin: Negative for rash.  Neurological: Negative for dizziness and headaches.       Objective:   Physical Exam  Constitutional: He appears well-developed and well-nourished. No distress.  HENT:  Mouth/Throat: Oropharynx is clear and moist. No oropharyngeal exudate.  Cardiovascular: Normal rate, regular rhythm and normal heart sounds.  Exam reveals no gallop and no friction rub.   No murmur heard. Pulmonary/Chest: Effort normal and breath sounds normal. No respiratory distress. He has no wheezes. He has no rales.          Assessment & Plan:

## 2013-01-08 NOTE — Assessment & Plan Note (Signed)
He is doing well and now undetectable. We'll continue to follow him in about 4 months

## 2013-05-27 ENCOUNTER — Telehealth: Payer: Self-pay | Admitting: *Deleted

## 2013-05-27 NOTE — Telephone Encounter (Signed)
Called and left a v/m for Domingo to return my call.  It is time to re-apply for his Truvada.

## 2013-05-28 ENCOUNTER — Other Ambulatory Visit: Payer: BC Managed Care – PPO

## 2013-06-11 ENCOUNTER — Ambulatory Visit: Payer: BC Managed Care – PPO | Admitting: Internal Medicine

## 2013-08-17 ENCOUNTER — Other Ambulatory Visit: Payer: Self-pay | Admitting: Licensed Clinical Social Worker

## 2013-08-17 DIAGNOSIS — B20 Human immunodeficiency virus [HIV] disease: Secondary | ICD-10-CM

## 2013-08-17 MED ORDER — RALTEGRAVIR POTASSIUM 400 MG PO TABS: 400.0000 mg | ORAL_TABLET | Freq: Two times a day (BID) | ORAL | Status: DC

## 2013-08-20 ENCOUNTER — Other Ambulatory Visit: Payer: Self-pay

## 2013-12-16 ENCOUNTER — Other Ambulatory Visit: Payer: BC Managed Care – PPO

## 2013-12-16 DIAGNOSIS — Z79899 Other long term (current) drug therapy: Secondary | ICD-10-CM

## 2013-12-16 DIAGNOSIS — Z113 Encounter for screening for infections with a predominantly sexual mode of transmission: Secondary | ICD-10-CM

## 2013-12-16 DIAGNOSIS — B20 Human immunodeficiency virus [HIV] disease: Secondary | ICD-10-CM

## 2013-12-16 LAB — COMPLETE METABOLIC PANEL WITH GFR
ALT: 57 U/L — AB (ref 0–53)
AST: 27 U/L (ref 0–37)
Albumin: 4.3 g/dL (ref 3.5–5.2)
Alkaline Phosphatase: 77 U/L (ref 39–117)
BILIRUBIN TOTAL: 0.3 mg/dL (ref 0.2–1.2)
BUN: 10 mg/dL (ref 6–23)
CHLORIDE: 101 meq/L (ref 96–112)
CO2: 27 mEq/L (ref 19–32)
CREATININE: 0.68 mg/dL (ref 0.50–1.35)
Calcium: 9.2 mg/dL (ref 8.4–10.5)
GFR, Est African American: >89 mL/min
GFR, Est Non African American: >89 mL/min
Glucose, Bld: 89 mg/dL (ref 70–99)
Potassium: 4 mEq/L (ref 3.5–5.3)
SODIUM: 135 meq/L (ref 135–145)
TOTAL PROTEIN: 7.8 g/dL (ref 6.0–8.3)

## 2013-12-16 LAB — CBC WITH DIFFERENTIAL/PLATELET
BASOS ABS: 0 10*3/uL (ref 0.0–0.1)
Basophils Relative: 0 % (ref 0–1)
EOS ABS: 0.1 10*3/uL (ref 0.0–0.7)
EOS PCT: 2 % (ref 0–5)
HCT: 43 % (ref 39.0–52.0)
Hemoglobin: 14.7 g/dL (ref 13.0–17.0)
LYMPHS PCT: 58 % — AB (ref 12–46)
Lymphs Abs: 2.8 10*3/uL (ref 0.7–4.0)
MCH: 30.2 pg (ref 26.0–34.0)
MCHC: 34.2 g/dL (ref 30.0–36.0)
MCV: 88.5 fL (ref 78.0–100.0)
Monocytes Absolute: 0.4 10*3/uL (ref 0.1–1.0)
Monocytes Relative: 9 % (ref 3–12)
NEUTROS PCT: 31 % — AB (ref 43–77)
Neutro Abs: 1.5 10*3/uL — ABNORMAL LOW (ref 1.7–7.7)
PLATELETS: 264 10*3/uL (ref 150–400)
RBC: 4.86 MIL/uL (ref 4.22–5.81)
RDW: 14 % (ref 11.5–15.5)
WBC: 4.8 10*3/uL (ref 4.0–10.5)

## 2013-12-16 LAB — LIPID PANEL
Cholesterol: 199 mg/dL (ref 0–200)
HDL: 28 mg/dL — AB (ref >39)
LDL CALC: 152 mg/dL — AB (ref 0–99)
TRIGLYCERIDES: 93 mg/dL (ref <150)
Total CHOL/HDL Ratio: 7.1 Ratio
VLDL: 19 mg/dL (ref 0–40)

## 2013-12-17 LAB — T-HELPER CELL (CD4) - (RCID CLINIC ONLY)
CD4 % Helper T Cell: 14 % — ABNORMAL LOW (ref 33–55)
CD4 T Cell Abs: 400 /uL (ref 400–2700)

## 2013-12-17 LAB — RPR

## 2013-12-18 LAB — HIV-1 RNA QUANT-NO REFLEX-BLD
HIV 1 RNA QUANT: 39232 {copies}/mL — AB (ref <20)
HIV-1 RNA Quant, Log: 4.59 {Log} — ABNORMAL HIGH (ref <1.30)

## 2014-01-19 ENCOUNTER — Telehealth: Payer: Self-pay | Admitting: *Deleted

## 2014-01-19 NOTE — Telephone Encounter (Signed)
Called Mr. Cooper Renderremols and left a v/m.  I asked him to return my call so I will know if he still need patient assistance.

## 2014-01-27 ENCOUNTER — Ambulatory Visit (INDEPENDENT_AMBULATORY_CARE_PROVIDER_SITE_OTHER): Payer: BC Managed Care – PPO | Admitting: Internal Medicine

## 2014-01-27 ENCOUNTER — Encounter: Payer: Self-pay | Admitting: Internal Medicine

## 2014-01-27 VITALS — BP 129/77 | HR 70 | Temp 97.9°F | Ht 68.0 in | Wt 129.0 lb

## 2014-01-27 DIAGNOSIS — J302 Other seasonal allergic rhinitis: Secondary | ICD-10-CM

## 2014-01-27 DIAGNOSIS — J309 Allergic rhinitis, unspecified: Secondary | ICD-10-CM

## 2014-01-27 DIAGNOSIS — F1910 Other psychoactive substance abuse, uncomplicated: Secondary | ICD-10-CM

## 2014-01-27 DIAGNOSIS — B20 Human immunodeficiency virus [HIV] disease: Secondary | ICD-10-CM

## 2014-01-27 HISTORY — DX: Other psychoactive substance abuse, uncomplicated: F19.10

## 2014-01-27 HISTORY — DX: Other seasonal allergic rhinitis: J30.2

## 2014-01-27 MED ORDER — ELVITEG-COBIC-EMTRICIT-TENOFDF 150-150-200-300 MG PO TABS: 1.0000 | ORAL_TABLET | Freq: Every day | ORAL | Status: DC

## 2014-01-27 MED ORDER — BECLOMETHASONE DIPROPIONATE 80 MCG/ACT NA AERS: 1.0000 | INHALATION_SPRAY | Freq: Two times a day (BID) | NASAL | Status: DC

## 2014-01-27 NOTE — Progress Notes (Signed)
Patient ID: Frank Webster, male   DOB: 1976/06/17, 38 y.o.   MRN: [id number] HPI: Frank Webster is a 38 y.o. male with HIV who is here for his visit now.   Allergies: No Known Allergies  Vitals: Temp: 97.9 F (36.6 C) (04/15 1515) Temp src: Oral (04/15 1515) BP: 129/77 mmHg (04/15 1515) Pulse Rate: 70 (04/15 1515)  Past Medical History: Past Medical History  Diagnosis Date  . HIV infection   . Depression     Social History: History   Social History  . Marital Status: Single    Spouse Name: N/A    Number of Children: N/A  . Years of Education: N/A   Social History Main Topics  . Smoking status: Current Every Day Smoker -- 0.75 packs/day    Types: Cigarettes  . Smokeless tobacco: Never Used  . Alcohol Use: No  . Drug Use: Yes    Special: Marijuana  . Sexual Activity: No     Comment: pt. declined condoms   Other Topics Concern  . None   Social History Narrative  . None    Previous Regimen:   Current Regimen: RAL + TRV  Labs: HIV 1 RNA Quant (copies/mL)  Date Value  12/16/2013 39232*  12/25/2012 <20   09/15/2012 86*     CD4 T Cell Abs (/uL)  Date Value  12/16/2013 400   12/25/2012 750   09/15/2012 780      Hep B S Ab (no units)  Date Value  02/24/2010 INDETER*     Hepatitis B Surface Ag (no units)  Date Value  02/24/2010 NEG      HCV Ab (no units)  Date Value  02/24/2010 NEG     CrCl: Estimated Creatinine Clearance: 102.6 ml/min (by C-G formula based on Cr of 0.68).  Lipids:    Component Value Date/Time   CHOL 199 12/16/2013 1414   TRIG 93 12/16/2013 1414   HDL 28* 12/16/2013 1414   CHOLHDL 7.1 12/16/2013 1414   VLDL 19 12/16/2013 1414   LDLCALC 152* 12/16/2013 1414    Assessment: 38 yo who has not been here for a while. He was previously on TRV and RAL but he has completely off of all therapy. He said that when he stopped, he stopped it completely. I noticed that he had flonase on his profile so I asked him about it and yes he is still  taking it right now. I told him to stop it when his stribild start. We'll likely change to flunisolide since the interaction is much lower. Dr. Luciana Axeomer has simplified the regimen to Stribild. We'll not start until his genotype is back and it's being drawn today.   Recommendations: F/u with genotype If no resistance then Stribild 1 tab PO qday Stop fluticasone Start flunisolide 2 sprays BID for allergy   Clide CliffMinh Quang Pham, PharmD Clinical Infectious Disease Pharmacist [facility] 01/27/2014, 3:58 PM

## 2014-01-27 NOTE — Progress Notes (Signed)
   Subjective:    Patient ID: Frank Webster, male    DOB: May 10, 1976, 38 y.o.   MRN: [id number]  HPI He comes in for follow up after a year long abscence.  He previously was on Truvada and Isentress and well-controlled but since last visit relapsed with drug use.  He basically does not remember much over the last year but has not been on his meds for many months.  He has been clean for 2 months and is interested in again getting back into care and stay off drugs.  Also needs help with his deductable and copay.     Review of Systems  Constitutional: Negative for fatigue and unexpected weight change.       Lost weeight since last year but has recently gained some back  Respiratory: Negative for shortness of breath.   Skin: Negative for rash.  Neurological: Negative for dizziness and light-headedness.  Hematological: Negative for adenopathy.       Objective:   Physical Exam  Constitutional: He appears well-developed and well-nourished. No distress.  HENT:  Mouth/Throat: No oropharyngeal exudate.  Eyes: Right eye exhibits no discharge. Left eye exhibits no discharge. No scleral icterus.  Cardiovascular: Normal rate, regular rhythm and normal heart sounds.   No murmur heard. Pulmonary/Chest: Effort normal and breath sounds normal. No respiratory distress.  Lymphadenopathy:    He has no cervical adenopathy.  Skin: No rash noted.  Psychiatric: He has a normal mood and affect. His behavior is normal.          Assessment & Plan:

## 2014-01-27 NOTE — Assessment & Plan Note (Signed)
Will stop flonase and substitute if he is able to take Stribild.

## 2014-01-27 NOTE — Assessment & Plan Note (Signed)
He will be scheduled with our substance abuse counselor.

## 2014-01-27 NOTE — Assessment & Plan Note (Signed)
Will check genotype and consider Stribild if no resistance.  He will need to meet with financial counselor first.

## 2014-01-29 LAB — HIV-1 RNA ULTRAQUANT REFLEX TO GENTYP+
HIV 1 RNA Quant: 58482 copies/mL — ABNORMAL HIGH (ref <20)
HIV-1 RNA Quant, Log: 4.77 {Log} — ABNORMAL HIGH (ref <1.30)

## 2014-02-02 ENCOUNTER — Telehealth: Payer: Self-pay | Admitting: *Deleted

## 2014-02-02 ENCOUNTER — Ambulatory Visit: Payer: BC Managed Care – PPO | Admitting: *Deleted

## 2014-02-02 DIAGNOSIS — F1020 Alcohol dependence, uncomplicated: Secondary | ICD-10-CM

## 2014-02-02 DIAGNOSIS — F1120 Opioid dependence, uncomplicated: Secondary | ICD-10-CM

## 2014-02-02 DIAGNOSIS — F1910 Other psychoactive substance abuse, uncomplicated: Secondary | ICD-10-CM

## 2014-02-02 DIAGNOSIS — F1410 Cocaine abuse, uncomplicated: Secondary | ICD-10-CM
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
## 2014-02-02 NOTE — Telephone Encounter (Signed)
Left message that labs are not yet resulted, should be back next week.  Patient should continue to stay off medications until he hears from Dr. Luciana Axeomer. Andree CossMichelle M Howell, RN

## 2014-02-06 LAB — HIV-1 INTEGRASE GENOTYPE

## 2014-02-09 ENCOUNTER — Ambulatory Visit: Payer: BC Managed Care – PPO | Admitting: *Deleted

## 2014-02-09 NOTE — Progress Notes (Signed)
Patient ID: Valeria BatmanGuillermo Ashby, male   DOB: Apr 12, 1976, 38 y.o.   MRN: [id number] [name] arrived on time, presented oriented x 4. Counselor and pt processed his ongoing recovery and variety of experiences tied into pt sobriety. [name] had a number of questions about what he has been feeling. We examined various situations he had encountered around being attracted to members of AA and him trying to understand some of the emotional content that is surfacing as a result of no longer numbing feelings with chronic drug use. Counselor offered overview of how emergence of feelings is normal in the recovery process and how [name] can benefit from recognizing these feelings and coping with them through dialogue about them with sponsor, counselor, and support network. Pt had sought out the advice of an AA sponsor in the gay support group meeting and was able to gain some additional perspective that helped him deal with personal questions/anxieties. We also focused today on relapse as a process that shares characteristics with sexual addiction. [name] shared having nearly completed an encounter with an unknown male but then changed his mind following a period of introspection in which he began to question the appropriateness of what he was doing. Pt ability to halt this activity was legitimately likened to halting the process of relapse as it is unfolding. Pt understood this connection. Counselor also provided education on how pt can modify his environment at home to lessen its connection to prior drug use and to increase his comfort with home as a positive refuge. Pt is positive about his recovery while also being candid about challenges he is facing in coping with fears and apprehensions. Pt has requested additional sessions to work on his new sobriety. We agree to meet weekly for some period of time. Interventions today successful as shown by pt eagerness to process thoughts and feelings associated with various  recovery issues. His follow through on meetings is very good and he is willing to incorporate feedback for self-improvement.   Viha Kriegel, CS[facility]

## 2014-02-11 ENCOUNTER — Telehealth: Payer: Self-pay | Admitting: *Deleted

## 2014-02-11 ENCOUNTER — Other Ambulatory Visit: Payer: Self-pay | Admitting: Internal Medicine

## 2014-02-11 LAB — HIV-1 GENOTYPR PLUS

## 2014-02-11 NOTE — Telephone Encounter (Signed)
Left patient a voice mail to call the clinic for his lab results. Also needs to schedule lab appt in 3 weeks and MD appt in 4-5 weeks. Frank Webster

## 2014-02-11 NOTE — Telephone Encounter (Signed)
Message copied by Macy MisOCKERHAM, JACQUELINE A on Thu Feb 11, 2014  5:09 PM ------      Message from: Gardiner BarefootOMER, ROBERT W      Created: Thu Feb 11, 2014  4:50 PM       Genotype finaly came back and is ok.  Let him know that he can now start Stribild.  He should get cmp, cbc, CD4 and viral load in about 3 weeks and follow up with me in 4-5 weeks.  thanks ------

## 2014-02-12 ENCOUNTER — Other Ambulatory Visit: Payer: Self-pay | Admitting: Internal Medicine

## 2014-02-12 ENCOUNTER — Other Ambulatory Visit: Payer: Self-pay

## 2014-02-12 DIAGNOSIS — B20 Human immunodeficiency virus [HIV] disease: Secondary | ICD-10-CM

## 2014-02-12 DIAGNOSIS — J310 Chronic rhinitis: Secondary | ICD-10-CM

## 2014-02-12 MED ORDER — BECLOMETHASONE DIPROPIONATE 80 MCG/ACT NA AERS: 1.0000 | INHALATION_SPRAY | Freq: Two times a day (BID) | NASAL | Status: DC

## 2014-02-12 MED ORDER — ELVITEG-COBIC-EMTRICIT-TENOFDF 150-150-200-300 MG PO TABS: 1.0000 | ORAL_TABLET | Freq: Every day | ORAL | Status: DC

## 2014-02-12 NOTE — Telephone Encounter (Signed)
Called patient and left voice mail that his results are back and to restart his medication and to call the clinic to schedule his lab and MD appt. Labs ordered Auto-Owners InsuranceJacqueline Cockerham

## 2014-02-13 ENCOUNTER — Encounter: Payer: Self-pay | Admitting: Internal Medicine

## 2014-02-16 ENCOUNTER — Other Ambulatory Visit: Payer: Self-pay | Admitting: *Deleted

## 2014-02-16 DIAGNOSIS — B20 Human immunodeficiency virus [HIV] disease: Secondary | ICD-10-CM

## 2014-02-16 MED ORDER — BECLOMETHASONE DIPROPIONATE 80 MCG/ACT NA AERS: 1.0000 | INHALATION_SPRAY | Freq: Two times a day (BID) | NASAL | Status: DC

## 2014-02-16 MED ORDER — ELVITEG-COBIC-EMTRICIT-TENOFDF 150-150-200-300 MG PO TABS: 1.0000 | ORAL_TABLET | Freq: Every day | ORAL | Status: DC

## 2014-02-18 ENCOUNTER — Ambulatory Visit: Payer: BC Managed Care – PPO | Admitting: *Deleted

## 2014-02-18 NOTE — Progress Notes (Signed)
Patient ID: Frank BatmanGuillermo Webster, male   DOB: May 25, 1976, 38 y.o.   MRN: [id number] Frank Webster presented oriented x 4, was clear, and well engaged in session. We processed events from his ongoing recovery efforts of past week and firing of AA sponsor. Ending this sponsor relationship was anxiety arousing for patient, but her persisted through it successfully as well as having made new efforts to secure another 12 step sponsor. Frank Webster had various questions about thoughts and feelings that emerged in the past week and wanted to talk about they affected his choices. Counselor offered empathic listening as well as options production & review with certain decisions Frank Webster was struggling with. We also processed pt feelings about his family of origin and a desire he has to one day reconnect with them and to build deeper bonds. Pt was receptive to counselor interventions. He is working on strengthening his recovery through support building, challenging self-defeating thoughts, and actively avoiding relapse through managing anxiety. Pt is showing sustained interest in behavior change, but at moderate risk for relapse due to early phase of recovery. Will return for additional sessions and will self-schedule through [facility] front desk.   Dossie Ocanas, CS[facility]

## 2014-03-02 ENCOUNTER — Ambulatory Visit: Payer: BC Managed Care – PPO | Admitting: *Deleted

## 2014-03-02 DIAGNOSIS — F1410 Cocaine abuse, uncomplicated: Secondary | ICD-10-CM

## 2014-03-02 DIAGNOSIS — F1120 Opioid dependence, uncomplicated: Secondary | ICD-10-CM

## 2014-03-02 DIAGNOSIS — F1020 Alcohol dependence, uncomplicated: Secondary | ICD-10-CM
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
## 2014-03-09 ENCOUNTER — Ambulatory Visit: Payer: BC Managed Care – PPO | Admitting: *Deleted

## 2014-03-09 DIAGNOSIS — F1120 Opioid dependence, uncomplicated: Secondary | ICD-10-CM

## 2014-03-09 DIAGNOSIS — F1410 Cocaine abuse, uncomplicated: Secondary | ICD-10-CM

## 2014-03-09 DIAGNOSIS — F1020 Alcohol dependence, uncomplicated: Secondary | ICD-10-CM

## 2014-03-09 NOTE — Progress Notes (Signed)
Patient ID: Frank Webster, male   DOB: 02/24/1976, 38 y.o.   MRN: [id number] Frank Webster presented oriented x 4, well focused in session. He acknowledged having had a difficult week although remaining drug free. He recounted meeting with his sister to celebrate his 67th birthday. However, the visit did not go as planned and pt felt somewhat frustrated with his sister being distracted and not supportive enough of his addiction/recovery struggles. He also had a bad day yesterday when he elected to attend a pool in which he wasn't sure he was welcomed. We processed these events and Frank Webster's feelings. Counselor educated pt on the sudden emotional swings which occur in early recovery. Counselor taught several coping tools for patient to utilize in managing his stress level and anxiety. He continues to challenge self despite feeling overwhelmed at times. Still attending 12 step meetings and in contact with sponsor. Will return to see this Clinical research associate and to meet new [facility] counselor. Today's interventions were successful.  Fe Okubo, CS[facility]

## 2014-03-16 ENCOUNTER — Ambulatory Visit: Payer: BC Managed Care – PPO | Admitting: *Deleted

## 2014-03-16 DIAGNOSIS — F1020 Alcohol dependence, uncomplicated: Secondary | ICD-10-CM

## 2014-03-16 DIAGNOSIS — F1410 Cocaine abuse, uncomplicated: Secondary | ICD-10-CM

## 2014-03-16 DIAGNOSIS — F1120 Opioid dependence, uncomplicated: Secondary | ICD-10-CM

## 2014-03-16 NOTE — Progress Notes (Signed)
Patient ID: Quashon Hubbard, male   DOB: 01/12/1976, 38 y.o.   MRN: [id number] Pt presented on time and oriented x 4. Pt reported having another interesting and challenging week learning to deal with his emotions and limited coping skills. Nicandro continues to states that he used drugs since teen years to mask feelings and avoid facing them. Counselor highlighted with patient various events from his recent past, the effect they had on his mood state, and how Joven can cope more effectively with the associated emotions without resorting to drug use. Pt expressed today satisfaction with his accumulated recovery gains while stating that he feels he has a long way to go to learn about his addiction and ways to avoid relapse. He reports sustained abstinence with several "close calls" in which he thought about using drugs but was able to short-circuit this build up process by immediately calling for 12 step recovery support and engaging in dialogue with sponsor. Counselor reinforces pt insights and use of proactive phone support while clarifying other techniques for coping that Foch might utilize. Pt remains receptive and committed to continued growth. Will schedule session next week.   Adler Alton, CSAC [facility] ([facility])

## 2014-03-23 ENCOUNTER — Other Ambulatory Visit: Payer: BC Managed Care – PPO

## 2014-03-23 DIAGNOSIS — B20 Human immunodeficiency virus [HIV] disease: Secondary | ICD-10-CM

## 2014-03-23 LAB — CBC WITH DIFFERENTIAL/PLATELET
Basophils Absolute: 0 10*3/uL (ref 0.0–0.1)
Basophils Relative: 1 % (ref 0–1)
Eosinophils Absolute: 0.1 10*3/uL (ref 0.0–0.7)
Eosinophils Relative: 2 % (ref 0–5)
HCT: 40 % (ref 39.0–52.0)
Hemoglobin: 13.7 g/dL (ref 13.0–17.0)
LYMPHS ABS: 2.2 10*3/uL (ref 0.7–4.0)
Lymphocytes Relative: 50 % — ABNORMAL HIGH (ref 12–46)
MCH: 29.7 pg (ref 26.0–34.0)
MCHC: 34.3 g/dL (ref 30.0–36.0)
MCV: 86.8 fL (ref 78.0–100.0)
MONOS PCT: 10 % (ref 3–12)
Monocytes Absolute: 0.4 10*3/uL (ref 0.1–1.0)
NEUTROS ABS: 1.6 10*3/uL — AB (ref 1.7–7.7)
NEUTROS PCT: 37 % — AB (ref 43–77)
PLATELETS: 267 10*3/uL (ref 150–400)
RBC: 4.61 MIL/uL (ref 4.22–5.81)
RDW: 15 % (ref 11.5–15.5)
WBC: 4.4 10*3/uL (ref 4.0–10.5)

## 2014-03-23 LAB — COMPREHENSIVE METABOLIC PANEL
ALBUMIN: 4.1 g/dL (ref 3.5–5.2)
ALT: 38 U/L (ref 0–53)
AST: 64 U/L — ABNORMAL HIGH (ref 0–37)
Alkaline Phosphatase: 55 U/L (ref 39–117)
BUN: 11 mg/dL (ref 6–23)
CALCIUM: 9.1 mg/dL (ref 8.4–10.5)
CHLORIDE: 104 meq/L (ref 96–112)
CO2: 26 meq/L (ref 19–32)
Creat: 0.72 mg/dL (ref 0.50–1.35)
GLUCOSE: 126 mg/dL — AB (ref 70–99)
POTASSIUM: 4.2 meq/L (ref 3.5–5.3)
Sodium: 139 mEq/L (ref 135–145)
Total Bilirubin: 0.4 mg/dL (ref 0.2–1.2)
Total Protein: 7.4 g/dL (ref 6.0–8.3)

## 2014-03-24 LAB — HIV-1 RNA QUANT-NO REFLEX-BLD
HIV 1 RNA QUANT: 111 {copies}/mL — AB (ref <20)
HIV-1 RNA QUANT, LOG: 2.05 {Log} — AB (ref <1.30)

## 2014-03-24 LAB — T-HELPER CELL (CD4) - (RCID CLINIC ONLY)
CD4 % Helper T Cell: 18 % — ABNORMAL LOW (ref 33–55)
CD4 T Cell Abs: 410 /uL (ref 400–2700)

## 2014-03-30 ENCOUNTER — Ambulatory Visit: Payer: BC Managed Care – PPO | Admitting: *Deleted

## 2014-03-30 DIAGNOSIS — F1020 Alcohol dependence, uncomplicated: Secondary | ICD-10-CM

## 2014-03-30 DIAGNOSIS — F1410 Cocaine abuse, uncomplicated: Secondary | ICD-10-CM

## 2014-03-30 DIAGNOSIS — F1120 Opioid dependence, uncomplicated: Secondary | ICD-10-CM

## 2014-03-30 NOTE — Progress Notes (Signed)
Patient ID: Frank Webster, male   DOB: November 11, 1975, 38 y.o.   MRN: [id number] Pt presented on time, oriented x 4. Frank Webster reports another week of continued total sobriety although he was in the unexpected presence of cocaine and was offered alcohol as well on a separate occasion. Pt effectively managed both situations and processed with counselor how his accumulated learning about recovery and relapse prevention have increased his skills in coping successfully with risk factors. We addressed today [name]'s recovery phase and that he has begun to self-identify an ability to avoid prolonged using thoughts. He recounted previously feeling very vulnerable to lapse and intensely needing 12 step support group meetings whenever he was stressed or anxious. However, pt shared that this past week he was better able to talk self through stressful incidents and feel sufficiently grounded a short time afterward without reliance upon meetings. Frank Webster regarded this as personal progress. Counselor further validated this perception. Counselor contrasted with pt his current recovery mindset against his previous negative, self-defeating thoughts which characterized past decision-making. Frank Webster was able to see that he has begun to adopt improved cognitions, mindfulness, and daily recovery practices. These have allowed him to experience less anxiety/worry and greater personal satisfaction, self-direction and assertiveness.  Max Menius, CS[facility])

## 2014-04-01 ENCOUNTER — Ambulatory Visit (INDEPENDENT_AMBULATORY_CARE_PROVIDER_SITE_OTHER): Payer: BC Managed Care – PPO | Admitting: Internal Medicine

## 2014-04-01 ENCOUNTER — Encounter: Payer: Self-pay | Admitting: Internal Medicine

## 2014-04-01 VITALS — BP 120/78 | HR 80 | Temp 98.4°F | Ht 68.0 in | Wt 125.0 lb

## 2014-04-01 DIAGNOSIS — Z23 Encounter for immunization: Secondary | ICD-10-CM

## 2014-04-01 DIAGNOSIS — F1910 Other psychoactive substance abuse, uncomplicated: Secondary | ICD-10-CM

## 2014-04-01 DIAGNOSIS — B20 Human immunodeficiency virus [HIV] disease: Secondary | ICD-10-CM

## 2014-04-01 NOTE — Addendum Note (Signed)
Addended by: Andree CossHOWELL, MICHELLE M on: 04/01/2014 11:34 AM   Modules accepted: Orders

## 2014-04-01 NOTE — Assessment & Plan Note (Signed)
Will check HCV ab with next labs.

## 2014-04-01 NOTE — Progress Notes (Signed)
   Subjective:    Patient ID: Frank Webster, male    DOB: 12-02-1975, 38 y.o.   MRN: [id number]  HPI  He comes in for follow up after a year long abscence.  He previously was on Truvada and Isentress and well-controlled but relapsed with drug use.  He basically does not remember much over the last year but has not been on his meds for many months.  He has been clean for several months and is followed closely with out substance abuse counselor.  He restarted his ARVs with Stribild and taking well with no missed doses.  His CD4 is stable at 410 and viral load back down to 111 from 58,482.  No missed doses.     Review of Systems  Constitutional: Negative for fatigue and unexpected weight change.  Respiratory: Negative for shortness of breath.   Skin: Negative for rash.  Neurological: Negative for dizziness and light-headedness.  Hematological: Negative for adenopathy.       Objective:   Physical Exam  Constitutional: He appears well-developed and well-nourished. No distress.  HENT:  Mouth/Throat: No oropharyngeal exudate.  Eyes: Right eye exhibits no discharge. Left eye exhibits no discharge. No scleral icterus.  Cardiovascular: Normal rate, regular rhythm and normal heart sounds.   No murmur heard. Pulmonary/Chest: Effort normal and breath sounds normal. No respiratory distress.  Lymphadenopathy:    He has no cervical adenopathy.  Skin: No rash noted.  Psychiatric: He has a normal mood and affect. His behavior is normal.          Assessment & Plan:

## 2014-04-01 NOTE — Addendum Note (Signed)
Addended by: Gardiner BarefootOMER, ROBERT W on: 04/01/2014 11:19 AM   Modules accepted: Orders

## 2014-04-01 NOTE — Assessment & Plan Note (Addendum)
Doing well and no missed doses.  Good suppression.  No side effects, creat good.  RTC 3 months.  Will update hep A and B vaccine.

## 2014-04-06 ENCOUNTER — Ambulatory Visit: Payer: BC Managed Care – PPO | Admitting: *Deleted

## 2014-04-06 DIAGNOSIS — F1120 Opioid dependence, uncomplicated: Secondary | ICD-10-CM

## 2014-04-06 DIAGNOSIS — F1410 Cocaine abuse, uncomplicated: Secondary | ICD-10-CM

## 2014-04-06 DIAGNOSIS — F1910 Other psychoactive substance abuse, uncomplicated: Secondary | ICD-10-CM

## 2014-04-06 DIAGNOSIS — F1020 Alcohol dependence, uncomplicated: Secondary | ICD-10-CM

## 2014-04-06 NOTE — Progress Notes (Signed)
Patient ID: Ebrima Ranta, male   DOB: 05-18-1976, 38 y.o.   MRN: [id number] Tyren was present today for his appointment.  Client was in good spirits as evidenced by laughing and smiling.  Client shared about the events of the weekend in which he met with several family members who were in town including his parents.  Client visited with his family on Saturday as well as Sunday.  Client communicated that he had not spoken to his father in three months and was nervous prior to going.  Client indicated that he was also feeling a little anxious about whether or not he would be preoccupied with the family drinking in front of him that he would not be able to concentrate and have a good time.  Client admitted that he focused on their drinking when he first arrived but quickly was able to turn his attention on to visiting with his family and enjoying seeing them since they live so far away.  Client stated that he feels ashamed for what he has put his parents through over the years due to his drug use.  Counselor educated client as to the difference between guilt and shame.  Counselor encouraged client to develop and understanding to focus on the behavior he does not focusing on himself.  Counselor processed with client how guilt focuses on behavior and shame focuses on the self negatively.  Client was invested in the session and provided much insight to his feelings and perceptions about his family and about himself in general.  Counselor provided support and understanding accordingly.  Rolena Infante LPCA, MA [facility]

## 2014-04-13 ENCOUNTER — Ambulatory Visit: Payer: BC Managed Care – PPO | Admitting: *Deleted

## 2014-04-13 DIAGNOSIS — F1020 Alcohol dependence, uncomplicated: Secondary | ICD-10-CM

## 2014-04-13 DIAGNOSIS — F1910 Other psychoactive substance abuse, uncomplicated: Secondary | ICD-10-CM

## 2014-04-13 DIAGNOSIS — F1410 Cocaine abuse, uncomplicated: Secondary | ICD-10-CM

## 2014-04-13 DIAGNOSIS — F1120 Opioid dependence, uncomplicated: Secondary | ICD-10-CM

## 2014-04-13 NOTE — Progress Notes (Signed)
Patient ID: Hill Mackie, male   DOB: 1976-07-30, 38 y.o.   MRN: [id number]  COUNSELOR MET WITH Winn TODAY.  CLIENT WAS IN GOOD SPIRITS AS EVIDENCED BY SMILING AND JUST GENERAL DEMEANOR.  CLIENT SHARED THAT HE HAD HAD A GOOD WEEK BUT WAS DEALING WITH KNOWING WHAT TO DO WHEN FACED WITH OTHER PEOPLE REQUESTING HIS ASSISTANCE WITH THEIR OW RECOVERY.  COUNSELOR SUGGESTED THAT CLIENT KEEP FOCUS MAINLY ON HIS OWN SOBRIETY. COUNSELOR ENCOURAGED CLIENT TO "CARRY THE MESSAGE NOT THE ADDICT". CLIENT WAS TRANSPARENT TODAY WITH HIS FEELINGS AND COMMUNICATED OPENLY AND HONESTLY.  CLIENT DISCUSSED HIS RECENT UNCOMFORTABLE FEELINGS ABOUT HIS SPONSOR WHOM HE LIKES AND ADMIRES GREATLY BEGINNING TO DATE A MUTUAL FRIEND.  CLIENT SHARED THAT HE BECAME JEALOUS OF THEIR RELATIONSHIP.  CLIENT INDICATED THAT HE REALIZED THAT HIS SPONSOR DID NOT JUST BELONG TO HIM BUT HAD A LIFE OUTSIDE OF HIM.  COUNSELOR ASSIGNED CLIENT FOR HOMEWORK TO LOOK UP TRANSFERENCE AND BEGIN REFLECTING ON THAT WORD IN ORDER TO UNDERSTAND WHAT HE MIGHT BE EXPERIENCING WITH HIS SPONSOR SO THAT IT DOES NOT BECOME MORE OF A PROBLEM AND HINDER HIS RECOVERY PROGRESS.  JODI HERRING, LPCA, MA [facility].

## 2014-04-19 ENCOUNTER — Telehealth: Payer: Self-pay | Admitting: *Deleted

## 2014-04-19 NOTE — Telephone Encounter (Signed)
RN initiated PA for patient's beclomethasone nasal spray.  This is the safest nasal spray when patient is on stribild.  The pharmacist is reviewing it, should have an answer in 24-48 hours faxed to [phone]. Andree CossHowell, Sebasthian Stailey M, RN

## 2014-04-20 ENCOUNTER — Ambulatory Visit: Payer: BC Managed Care – PPO | Admitting: *Deleted

## 2014-04-20 DIAGNOSIS — F1910 Other psychoactive substance abuse, uncomplicated: Secondary | ICD-10-CM

## 2014-04-20 NOTE — Progress Notes (Signed)
Patient ID: Frank Webster, male   DOB: 1976-08-17, 38 y.o.   MRN: [id number]  COUNSELOR MET WITH PATIENT TODAY.  PATIENT WAS IN GOOD SPIRITS AS EVIDENCED BY LAUGHING AND SMILING THROUGHOUT THE SESSION.  PATIENT COMMUNICATED THAT HE WAS HAVING A DIFFICULT TIME AT WORK AL LATELY AS A RESULT OF HIS FELLOW CO WORKERS NOT DOING THEIR JOB ACCORDINGLY.  COUNSELOR RECOMMENDED TO PATIENT TO TRY AND STAY FOCUSED ON HIS RECOVERY AND HIS OWN POSITION THERE AND NOT TO WORRY HIMSELF SO MUCH WITH OTHERS.  PATIENT AGREED THAT HE NEEDS TO LEARN TO PICK HIS BATTLES BETTER IN ORDER TO GET THROUGH THE DAYS WHERE HE HAS A VERY LONG SHIFT TO WORK.  COUNSELOR SHARED AFFECTIVE REFUSAL SKILLS WHEN IT COMES TO BEING TEMPTED TO USE.  PATIENT STATED THAT HE LEARNED THAT JUST BY SAYING THE WORD "NO" ASSERTIVELY BUT RESPECTFULLY CAN WORK MIRACLES. PATIENT SHARED THAT HE SHOULD PRACTICE WHAT TO SAY WHEN THE NEED ARISES AND BE PREPARED WITH A STANDARD ANSWER. COUNSELOR EDUCATED PATIENT ON STRATEGIES FOR AVOIDING POTENTIAL PRESSURE FROM OTHERS THAT ARE USING SUCH AS: DON'T HESITATE WITH YOUR ANSWER OF NO, KEEP A NON-ALCOHOLIC DRINK IN HANDS AT ALL TIMES AND SIP ON IT, SIMPLY COMMUNICATE TO OTHERS THAT YOU ARE WORKING HARD ON YOUR SOBRIETY AND THAT YOU NEED THEM TO REFRAIN FROM PRESSURING YOU AND OR NOT USING AT ALL IN FRONT OF YOU. PATIENT AGREED THAT THESE WERE GOOD IDEAS AS THESE INSTANCES CONTINUOUSLY HAPPEN.  COUNSELOR ENCOURAGED PATIENT TO KEEP RESPONSES SHORT BUT CLEAR.  COUNSELOR PROVIDED MUCH NEEDED SUPPORT AND ENCOURAGEMENT.   JODI HERRING, LPCA, MA [facility]

## 2014-04-28 ENCOUNTER — Telehealth: Payer: Self-pay | Admitting: *Deleted

## 2014-04-28 NOTE — Telephone Encounter (Signed)
[employer] needing Prior Authorization.  Completed application on the phone.  It will take 24-48 hours for pharmacist review before knowing whether rx will be authorized.

## 2014-04-30 ENCOUNTER — Other Ambulatory Visit: Payer: Self-pay | Admitting: Internal Medicine

## 2014-04-30 MED ORDER — FLUNISOLIDE 25 MCG/ACT (0.025%) NA SOLN: 1.0000 | Freq: Two times a day (BID) | NASAL | Status: DC

## 2014-04-30 NOTE — Progress Notes (Signed)
Per Dr. Luciana Axeomer discontinue Beclomethasone nasal spray.  Start Flunisolide nasal spray with same directions.

## 2014-05-04 ENCOUNTER — Ambulatory Visit: Payer: BC Managed Care – PPO | Admitting: *Deleted

## 2014-05-04 ENCOUNTER — Ambulatory Visit (INDEPENDENT_AMBULATORY_CARE_PROVIDER_SITE_OTHER): Payer: BC Managed Care – PPO | Admitting: Licensed Clinical Social Worker

## 2014-05-04 ENCOUNTER — Telehealth: Payer: Self-pay | Admitting: *Deleted

## 2014-05-04 DIAGNOSIS — B20 Human immunodeficiency virus [HIV] disease: Secondary | ICD-10-CM

## 2014-05-04 DIAGNOSIS — Z23 Encounter for immunization: Secondary | ICD-10-CM

## 2014-05-04 NOTE — Telephone Encounter (Signed)
Called [facility].  Unable to get Flunisolide.  We do not have a supporting dx for this medication.  Called Frank Webster and told him.  He will try a saline spray.

## 2014-05-04 NOTE — Telephone Encounter (Signed)
Per TorringtonMinh, patient will have to try/fail the flunisolide before the qvar PA can be pursued further.  Patient has a $40 copay for the flunisolide. He is to meet with Pam for assistance options.  Additionally, patient is noticing he has itchy, watery eyes.  He would like to know if he could have a prescription for allergy eye drops.  Please advise. Andree CossHowell, Wilma Wuthrich M, RN

## 2014-05-17 ENCOUNTER — Other Ambulatory Visit: Payer: Self-pay | Admitting: Internal Medicine

## 2014-05-17 MED ORDER — KETOTIFEN FUMARATE 0.025 % OP SOLN: 1.0000 [drp] | Freq: Two times a day (BID) | OPHTHALMIC | Status: DC

## 2014-08-05 ENCOUNTER — Telehealth: Payer: Self-pay | Admitting: *Deleted

## 2014-08-05 NOTE — Telephone Encounter (Signed)
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 He last had an outbreak ~20 years ago that "exploded over night."  He has not been seen here for that in the past, has no active medication on his list to treat it.  Earliest available appointment is next week. Patient does have insurance, will try urgent care for treatment/diagnosis.  He is interested in setting up PCP care.  He will contact his insurance company for a list of approved providers and go from there. Andree CossHowell, Knut Rondinelli M, RN

## 2014-08-06 ENCOUNTER — Emergency Department (INDEPENDENT_AMBULATORY_CARE_PROVIDER_SITE_OTHER)
Admission: EM | Admit: 2014-08-06 | Discharge: 2014-08-06 | Discharge status: Absent without leave | Payer: BC Managed Care – PPO

## 2014-08-06 ENCOUNTER — Emergency Department (HOSPITAL_COMMUNITY)
Admission: EM | Admit: 2014-08-06 | Discharge: 2014-08-06 | Disposition: A | Discharge status: Home / Self Care | Payer: BC Managed Care – PPO | Source: Home / Self Care

## 2014-08-06 DIAGNOSIS — Z21 Asymptomatic human immunodeficiency virus [HIV] infection status: Secondary | ICD-10-CM

## 2014-08-06 DIAGNOSIS — B009 Herpesviral infection, unspecified: Secondary | ICD-10-CM

## 2014-08-06 DIAGNOSIS — B20 Human immunodeficiency virus [HIV] disease: Secondary | ICD-10-CM

## 2014-08-06 DIAGNOSIS — L0100 Impetigo, unspecified: Secondary | ICD-10-CM

## 2014-08-06 NOTE — ED Provider Notes (Signed)
CSN: [id number]     Arrival date & time 08/06/14  0913 History   None    No chief complaint on file.  (Consider location/radiation/quality/duration/timing/severity/associated sxs/prior Treatment) HPI  Past Medical History  Diagnosis Date  . HIV infection   . Depression    No past surgical history on file. Family History  Problem Relation Age of Onset  . Arthritis Neg Hx   . Lupus Neg Hx   . Rosacea Neg Hx    History  Substance Use Topics  . Smoking status: Current Every Day Smoker -- 0.75 packs/day    Types: Cigarettes  . Smokeless tobacco: Never Used  . Alcohol Use: No     Comment: in recovery    Review of Systems  Allergies  Review of patient's allergies indicates no known allergies.  Home Medications   Prior to Admission medications   Medication Sig Start Date End Date Taking? Authorizing Provider  elvitegravir-cobicistat-emtricitabine-tenofovir (STRIBILD) 150-150-200-300 MG TABS tablet Take 1 tablet by mouth daily. 02/16/14   Gardiner Barefootobert W Comer, MD  flunisolide (NASALIDE) 25 MCG/ACT (0.025%) SOLN Place 1 spray into the nose 2 (two) times daily. 04/30/14   Gardiner Barefootobert W Comer, MD  ketotifen (ZADITOR) 0.025 % ophthalmic solution Place 1 drop into both eyes 2 (two) times daily. 05/17/14   Gardiner Barefootobert W Comer, MD   There were no vitals taken for this visit. Physical Exam  ED Course  Procedures (including critical care time) Labs Review Labs Reviewed - No data to display  Imaging Review No results found.   MDM   1. Herpes infection   2. Impetigo   3. HIV (human immunodeficiency virus infection)    PT SEEN DURING SYSTEM OUTAGE SEE SCANNED IMAGES FOR NOTES  CHARGES IN   Shelly Flattenavid Alyxandria Wentz, MD Family Medicine 08/06/2014, 9:17 AM    Ozella Rocksavid J Eeva Schlosser, MD 08/06/14 [phone]

## 2014-09-14 ENCOUNTER — Other Ambulatory Visit: Payer: BC Managed Care – PPO

## 2014-09-14 DIAGNOSIS — B20 Human immunodeficiency virus [HIV] disease: Secondary | ICD-10-CM

## 2014-09-14 LAB — COMPLETE METABOLIC PANEL WITH GFR
ALBUMIN: 4.3 g/dL (ref 3.5–5.2)
ALT: 28 U/L (ref 0–53)
AST: 22 U/L (ref 0–37)
Alkaline Phosphatase: 61 U/L (ref 39–117)
BUN: 15 mg/dL (ref 6–23)
CALCIUM: 9.4 mg/dL (ref 8.4–10.5)
CHLORIDE: 99 meq/L (ref 96–112)
CO2: 31 meq/L (ref 19–32)
CREATININE: 0.87 mg/dL (ref 0.50–1.35)
GLUCOSE: 143 mg/dL — AB (ref 70–99)
POTASSIUM: 4.4 meq/L (ref 3.5–5.3)
Sodium: 136 mEq/L (ref 135–145)
Total Bilirubin: 0.4 mg/dL (ref 0.2–1.2)
Total Protein: 7.2 g/dL (ref 6.0–8.3)

## 2014-09-14 LAB — CBC WITH DIFFERENTIAL/PLATELET
BASOS ABS: 0 10*3/uL (ref 0.0–0.1)
Basophils Relative: 0 % (ref 0–1)
Eosinophils Absolute: 0.1 10*3/uL (ref 0.0–0.7)
Eosinophils Relative: 3 % (ref 0–5)
HCT: 43.6 % (ref 39.0–52.0)
Hemoglobin: 14.9 g/dL (ref 13.0–17.0)
LYMPHS PCT: 53 % — AB (ref 12–46)
Lymphs Abs: 2.2 10*3/uL (ref 0.7–4.0)
MCH: 30.8 pg (ref 26.0–34.0)
MCHC: 34.2 g/dL (ref 30.0–36.0)
MCV: 90.3 fL (ref 78.0–100.0)
MPV: 9.6 fL (ref 9.4–12.4)
Monocytes Absolute: 0.4 10*3/uL (ref 0.1–1.0)
Monocytes Relative: 10 % (ref 3–12)
NEUTROS ABS: 1.4 10*3/uL — AB (ref 1.7–7.7)
NEUTROS PCT: 34 % — AB (ref 43–77)
PLATELETS: 288 10*3/uL (ref 150–400)
RBC: 4.83 MIL/uL (ref 4.22–5.81)
RDW: 13.8 % (ref 11.5–15.5)
WBC: 4.1 10*3/uL (ref 4.0–10.5)

## 2014-09-14 LAB — HEPATITIS C ANTIBODY: HCV Ab: NEGATIVE

## 2014-09-15 LAB — T-HELPER CELL (CD4) - (RCID CLINIC ONLY)
CD4 % Helper T Cell: 27 % — ABNORMAL LOW (ref 33–55)
CD4 T Cell Abs: 580 /uL (ref 400–2700)

## 2014-09-15 LAB — HIV-1 RNA QUANT-NO REFLEX-BLD
HIV 1 RNA Quant: 20 copies/mL (ref <20)
HIV-1 RNA QUANT, LOG: 1.3 {Log} (ref <1.30)

## 2014-09-30 ENCOUNTER — Encounter: Payer: Self-pay | Admitting: Internal Medicine

## 2014-09-30 ENCOUNTER — Ambulatory Visit (INDEPENDENT_AMBULATORY_CARE_PROVIDER_SITE_OTHER): Payer: BC Managed Care – PPO | Admitting: Internal Medicine

## 2014-09-30 VITALS — BP 116/73 | HR 67 | Temp 97.7°F | Ht 68.0 in | Wt 129.0 lb

## 2014-09-30 DIAGNOSIS — F1910 Other psychoactive substance abuse, uncomplicated: Secondary | ICD-10-CM | POA: Diagnosis not present

## 2014-09-30 DIAGNOSIS — H60399 Other infective otitis externa, unspecified ear: Secondary | ICD-10-CM | POA: Insufficient documentation

## 2014-09-30 DIAGNOSIS — B20 Human immunodeficiency virus [HIV] disease: Secondary | ICD-10-CM

## 2014-09-30 DIAGNOSIS — Z79899 Other long term (current) drug therapy: Secondary | ICD-10-CM

## 2014-09-30 DIAGNOSIS — Z113 Encounter for screening for infections with a predominantly sexual mode of transmission: Secondary | ICD-10-CM | POA: Diagnosis not present

## 2014-09-30 DIAGNOSIS — H60391 Other infective otitis externa, right ear: Secondary | ICD-10-CM | POA: Diagnosis not present

## 2014-09-30 MED ORDER — CIPROFLOXACIN-DEXAMETHASONE 0.3-0.1 % OT SUSP: 4.0000 [drp] | Freq: Two times a day (BID) | OTIC | Status: AC

## 2014-09-30 MED ORDER — ELVITEG-COBIC-EMTRICIT-TENOFAF 150-150-200-10 MG PO TABS: 1.0000 | ORAL_TABLET | Freq: Every day | ORAL | Status: DC

## 2014-10-04 NOTE — Assessment & Plan Note (Signed)
Congratulated him on continued drug free state

## 2014-10-04 NOTE — Progress Notes (Signed)
   Subjective:    Patient ID: Frank Webster, male    DOB: [birth year], 38 y.o.   MRN: [id number]  HPI He comes in for follow up of HIV.  He previously was on Truvada and Isentress and well-controlled but relapsed with drug use.  He underwent counseling and rehab and continues to remain clean. He has been clean for several months and is followed closely with out substance abuse counselor.  He restarted his ARVs with Stribild and taking well with no missed doses.  His CD4 is stable at 580 and viral load 20 from 58,482.  No missed doses.     Review of Systems  Constitutional: Negative for fatigue and unexpected weight change.  Respiratory: Negative for shortness of breath.   Skin: Negative for rash.  Neurological: Negative for dizziness and light-headedness.  Hematological: Negative for adenopathy.       Objective:   Physical Exam  Constitutional: He appears well-developed and well-nourished. No distress.  HENT:  Mouth/Throat: No oropharyngeal exudate.  Eyes: No scleral icterus.  Cardiovascular: Normal rate, regular rhythm and normal heart sounds.   No murmur heard. Pulmonary/Chest: Effort normal and breath sounds normal. No respiratory distress.  Lymphadenopathy:    He has no cervical adenopathy.  Skin: No rash noted.          Assessment & Plan:

## 2014-10-04 NOTE — Assessment & Plan Note (Signed)
Doing well and remains drug free.  RTC 4 months

## 2014-10-04 NOTE — Assessment & Plan Note (Signed)
Will try topical therapy.   

## 2014-10-05 ENCOUNTER — Ambulatory Visit (INDEPENDENT_AMBULATORY_CARE_PROVIDER_SITE_OTHER): Payer: BC Managed Care – PPO | Admitting: *Deleted

## 2014-10-05 DIAGNOSIS — B20 Human immunodeficiency virus [HIV] disease: Secondary | ICD-10-CM | POA: Diagnosis not present

## 2014-10-05 DIAGNOSIS — Z23 Encounter for immunization: Secondary | ICD-10-CM

## 2014-10-18 ENCOUNTER — Other Ambulatory Visit: Payer: Self-pay | Admitting: *Deleted

## 2014-10-18 ENCOUNTER — Telehealth: Payer: Self-pay | Admitting: *Deleted

## 2014-10-18 MED ORDER — ELVITEG-COBIC-EMTRICIT-TENOFDF 150-150-200-300 MG PO TABS: 1.0000 | ORAL_TABLET | Freq: Every day | ORAL | Status: DC

## 2014-10-18 NOTE — Telephone Encounter (Signed)
Pt switched insurance to [facility] - doesn't cover Genvoya,  needing switched back to previous regimen - Stribild.  MD please advise.

## 2014-10-19 ENCOUNTER — Telehealth: Payer: Self-pay | Admitting: *Deleted

## 2014-10-19 DIAGNOSIS — B20 Human immunodeficiency virus [HIV] disease: Secondary | ICD-10-CM

## 2014-10-19 MED ORDER — ELVITEG-COBIC-EMTRICIT-TENOFAF 150-150-200-10 MG PO TABS
1.0000 | ORAL_TABLET | Freq: Every day | ORAL | Status: DC
Start: 2014-10-19 — End: 2015-02-15

## 2014-10-19 MED ORDER — ELVITEG-COBIC-EMTRICIT-TENOFDF 150-150-200-300 MG PO TABS: 1.0000 | ORAL_TABLET | Freq: Every day | ORAL | Status: DC

## 2014-10-19 NOTE — Telephone Encounter (Signed)
[facility] / [employer] is pt's new insurance.  Pt needs to have rx sent to [facility] to begin mail order.  Stribild rx sent to [facility] today.  Unable to fill until 10/20/14.  Pt will need to call [facility] 10/20/14 to fill the rx [phone].  [employer] does not allow "override" for pt to obtain a local 1st fill of medication.   Pt verbalized understanding to call [facility] tomorrow to order refill for Stribild.

## 2014-10-20 NOTE — Telephone Encounter (Signed)
Patient approved effective today - 10/20/2016 for Stribild.  ZO#[id number]. Left patient a voicemail notifying him of approval, asked him to call [facility] at [phone] to set up delivery ASAP.  Pt instructed to call if he has any more difficulty. Andree CossHowell, Elchanan Bob M, RN

## 2015-01-13 ENCOUNTER — Other Ambulatory Visit: Payer: BC Managed Care – PPO

## 2015-01-25 ENCOUNTER — Ambulatory Visit: Payer: BC Managed Care – PPO | Admitting: Internal Medicine

## 2015-02-15 ENCOUNTER — Other Ambulatory Visit (INDEPENDENT_AMBULATORY_CARE_PROVIDER_SITE_OTHER): Payer: 59

## 2015-02-15 ENCOUNTER — Encounter: Payer: Self-pay | Admitting: Family

## 2015-02-15 ENCOUNTER — Ambulatory Visit (INDEPENDENT_AMBULATORY_CARE_PROVIDER_SITE_OTHER): Payer: 59 | Admitting: Family

## 2015-02-15 VITALS — BP 112/72 | HR 66 | Temp 98.2°F | Resp 18 | Ht 68.0 in | Wt 136.0 lb

## 2015-02-15 DIAGNOSIS — Z0000 Encounter for general adult medical examination without abnormal findings: Secondary | ICD-10-CM

## 2015-02-15 DIAGNOSIS — Z23 Encounter for immunization: Secondary | ICD-10-CM

## 2015-02-15 DIAGNOSIS — B20 Human immunodeficiency virus [HIV] disease: Secondary | ICD-10-CM

## 2015-02-15 LAB — LIPID PANEL
Cholesterol: 209 mg/dL — ABNORMAL HIGH (ref 0–200)
HDL: 26.8 mg/dL — AB (ref >39.00)
LDL Cholesterol: 170 mg/dL — ABNORMAL HIGH (ref 0–99)
NONHDL: 182.2
Total CHOL/HDL Ratio: 8
Triglycerides: 62 mg/dL (ref 0.0–149.0)
VLDL: 12.4 mg/dL (ref 0.0–40.0)

## 2015-02-15 LAB — CBC
HEMATOCRIT: 43.5 % (ref 39.0–52.0)
Hemoglobin: 15 g/dL (ref 13.0–17.0)
MCHC: 34.4 g/dL (ref 30.0–36.0)
MCV: 90.7 fl (ref 78.0–100.0)
PLATELETS: 266 10*3/uL (ref 150.0–400.0)
RBC: 4.79 Mil/uL (ref 4.22–5.81)
RDW: 13.6 % (ref 11.5–15.5)
WBC: 4.2 10*3/uL (ref 4.0–10.5)

## 2015-02-15 LAB — BASIC METABOLIC PANEL
BUN: 14 mg/dL (ref 6–23)
CALCIUM: 9.7 mg/dL (ref 8.4–10.5)
CO2: 31 mEq/L (ref 19–32)
Chloride: 100 mEq/L (ref 96–112)
Creatinine, Ser: 0.78 mg/dL (ref 0.40–1.50)
GFR: 116.61 mL/min (ref >60.00)
Glucose, Bld: 89 mg/dL (ref 70–99)
Potassium: 4.3 mEq/L (ref 3.5–5.1)
SODIUM: 136 meq/L (ref 135–145)

## 2015-02-15 LAB — TSH: TSH: 1.26 u[IU]/mL (ref 0.35–4.50)

## 2015-02-15 LAB — PSA: PSA: 0.47 ng/mL (ref 0.10–4.00)

## 2015-02-15 MED ORDER — PRAVASTATIN SODIUM 40 MG PO TABS: 40.0000 mg | ORAL_TABLET | Freq: Every day | ORAL | Status: DC

## 2015-02-15 NOTE — Assessment & Plan Note (Signed)
Referral to infectious disease placed per patient request

## 2015-02-15 NOTE — Assessment & Plan Note (Addendum)
1) Anticipatory Guidance: Discussed importance of wearing a seatbelt while driving and not texting while driving; changing batteries in smoke detector at least once annually; wearing suntan lotion when outside; eating a balanced and moderate diet; getting physical activity at least 30 minutes per day.  2) Immunizations / Screenings / Labs:  Tetanus shot updated today. All other immunizations are up-to-date per recommendations. Patient is due for a vision screen which she will schedule independently. PSA will be checked today. All other screenings are up-to-date per recommendations. Obtain CBC, BMET, Lipid profile and TSH.   Overall well exam. Patient has minimal cardiovascular risk factors at this time. Continue current healthy lifestyle behaviors. His HIV appears to be fairly well controlled with current regimen. Referrals placed to infectious disease per patient request. Follow up prevention exam in one year. Office follow-up pending lab work.

## 2015-02-15 NOTE — Patient Instructions (Signed)

## 2015-02-15 NOTE — Progress Notes (Signed)
Pre visit review using our clinic review tool, if applicable. No additional management support is needed unless otherwise documented below in the visit note. 

## 2015-02-15 NOTE — Addendum Note (Signed)
Addended by: Mercer PodWRENN, Piero Mustard E on: 02/15/2015 02:03 PM   Modules accepted: Orders

## 2015-02-15 NOTE — Progress Notes (Signed)
Subjective:    Patient ID: Frank Webster, male    DOB: 08-01-1976, 39 y.o.   MRN: [id number]  Chief Complaint  Patient presents with  . Establish Care    referral to infectious diseases, CPE    HPI:  Frank Webster is a 39 y.o. male who presents today for an annual wellness visit.   1) Health Maintenance -   Diet - Averages 2-3 meals per day; vegetables and proteins mainly; Caffeine intake about 3-4 cups per day.   Exercise - Mixture of cardio and resistance training about 3 x per week.   2) Preventative Exams / Immunizations:  Dental -- Up to date  Vision -- Due for exam   Health Maintenance  Topic Date Due  . TETANUS/TDAP  03/04/1993  . INFLUENZA VACCINE  05/16/2015  . HIV Screening  Completed  Tetanus shot  Immunization History  Administered Date(s) Administered  . Hepatitis A, Adult 04/01/2014, 10/05/2014  . Hepatitis B, adult/adol-2 dose 04/01/2014, 05/04/2014, 10/05/2014  . Influenza Split 09/13/2011, 09/29/2012  . Pneumococcal Polysaccharide-23 05/14/2011   Review of Systems  Constitutional: Denies fever, chills, fatigue, or significant weight gain/loss. HENT: Head: Denies headache or neck pain Ears: Denies changes in hearing, ringing in ears, earache, drainage Nose: Denies discharge, stuffiness, itching, nosebleed, sinus pain Throat: Denies sore throat, hoarseness, dry mouth, sores, thrush Eyes: Denies loss/changes in vision, pain, redness, blurry/double vision, flashing lights Cardiovascular: Denies chest pain/discomfort, tightness, palpitations, shortness of breath with activity, difficulty lying down, swelling, sudden awakening with shortness of breath Respiratory: Denies shortness of breath, cough, sputum production, wheezing Gastrointestinal: Denies dysphasia, heartburn, change in appetite, nausea, change in bowel habits, rectal bleeding, constipation, diarrhea, yellow skin or eyes Genitourinary: Denies frequency, urgency, burning/pain, blood  in urine, incontinence, change in urinary strength. Musculoskeletal: Denies muscle/joint pain, stiffness, back pain, redness or swelling of joints, trauma Skin: Denies rashes, lumps, itching, dryness, color changes, or hair/nail changes Neurological: Denies dizziness, fainting, seizures, weakness, numbness, tingling, tremor Psychiatric - Denies nervousness, stress, depression or memory loss Endocrine: Denies heat or cold intolerance, sweating, frequent urination, excessive thirst, changes in appetite Hematologic: Denies ease of bruising or bleeding     Objective:     BP 112/72 mmHg  Pulse 66  Temp(Src) 98.2 F (36.8 C) (Oral)  Resp 18  Ht  (1.727 m)  Wt 136 lb (61.689 kg)  BMI 20.68 kg/m2  SpO2 99% Nursing note and vital signs reviewed.  Physical Exam  Constitutional: He is oriented to person, place, and time. He appears well-developed and well-nourished.  HENT:  Head: Normocephalic.  Right Ear: Hearing, tympanic membrane, external ear and ear canal normal.  Left Ear: Hearing, tympanic membrane, external ear and ear canal normal.  Nose: Nose normal.  Mouth/Throat: Uvula is midline, oropharynx is clear and moist and mucous membranes are normal.  Eyes: Conjunctivae and EOM are normal. Pupils are equal, round, and reactive to light.  Neck: Neck supple. No JVD present. No tracheal deviation present. No thyromegaly present.  Cardiovascular: Normal rate, regular rhythm, normal heart sounds and intact distal pulses.   Pulmonary/Chest: Effort normal and breath sounds normal.  Abdominal: Soft. Bowel sounds are normal. He exhibits no distension and no mass. There is no tenderness. There is no rebound and no guarding.  Musculoskeletal: Normal range of motion. He exhibits no edema or tenderness.  Lymphadenopathy:    He has no cervical adenopathy.  Neurological: He is alert and oriented to person, place, and time. He has normal reflexes. No  cranial nerve deficit. He exhibits normal  muscle tone. Coordination normal.  Skin: Skin is warm and dry.  Psychiatric: He has a normal mood and affect. His behavior is normal. Judgment and thought content normal.       Assessment & Plan:

## 2015-02-18 MED ORDER — PRAVASTATIN SODIUM 40 MG PO TABS: 40.0000 mg | ORAL_TABLET | Freq: Every day | ORAL | Status: DC

## 2015-03-08 ENCOUNTER — Other Ambulatory Visit: Payer: 59

## 2015-03-08 DIAGNOSIS — Z113 Encounter for screening for infections with a predominantly sexual mode of transmission: Secondary | ICD-10-CM

## 2015-03-08 DIAGNOSIS — B20 Human immunodeficiency virus [HIV] disease: Secondary | ICD-10-CM

## 2015-03-08 LAB — CBC WITH DIFFERENTIAL/PLATELET
BASOS ABS: 0 10*3/uL (ref 0.0–0.1)
BASOS PCT: 0 % (ref 0–1)
EOS PCT: 2 % (ref 0–5)
Eosinophils Absolute: 0.1 10*3/uL (ref 0.0–0.7)
HCT: 44.6 % (ref 39.0–52.0)
Hemoglobin: 15.5 g/dL (ref 13.0–17.0)
Lymphocytes Relative: 49 % — ABNORMAL HIGH (ref 12–46)
Lymphs Abs: 2.2 10*3/uL (ref 0.7–4.0)
MCH: 31.4 pg (ref 26.0–34.0)
MCHC: 34.8 g/dL (ref 30.0–36.0)
MCV: 90.3 fL (ref 78.0–100.0)
MONO ABS: 0.5 10*3/uL (ref 0.1–1.0)
MONOS PCT: 11 % (ref 3–12)
MPV: 9.3 fL (ref 8.6–12.4)
Neutro Abs: 1.7 10*3/uL (ref 1.7–7.7)
Neutrophils Relative %: 38 % — ABNORMAL LOW (ref 43–77)
Platelets: 253 10*3/uL (ref 150–400)
RBC: 4.94 MIL/uL (ref 4.22–5.81)
RDW: 14.1 % (ref 11.5–15.5)
WBC: 4.4 10*3/uL (ref 4.0–10.5)

## 2015-03-08 LAB — COMPLETE METABOLIC PANEL WITH GFR
ALK PHOS: 62 U/L (ref 39–117)
ALT: 34 U/L (ref 0–53)
AST: 22 U/L (ref 0–37)
Albumin: 4.6 g/dL (ref 3.5–5.2)
BUN: 17 mg/dL (ref 6–23)
CO2: 26 mEq/L (ref 19–32)
Calcium: 9.4 mg/dL (ref 8.4–10.5)
Chloride: 100 mEq/L (ref 96–112)
Creat: 0.89 mg/dL (ref 0.50–1.35)
GFR, Est African American: >89 mL/min
GFR, Est Non African American: >89 mL/min
GLUCOSE: 99 mg/dL (ref 70–99)
Potassium: 4.3 mEq/L (ref 3.5–5.3)
Sodium: 134 mEq/L — ABNORMAL LOW (ref 135–145)
Total Bilirubin: 0.4 mg/dL (ref 0.2–1.2)
Total Protein: 7.3 g/dL (ref 6.0–8.3)

## 2015-03-08 LAB — RPR

## 2015-03-09 LAB — T-HELPER CELL (CD4) - (RCID CLINIC ONLY)
CD4 % Helper T Cell: 30 % — ABNORMAL LOW (ref 33–55)
CD4 T Cell Abs: 680 /uL (ref 400–2700)

## 2015-03-10 LAB — HIV-1 RNA QUANT-NO REFLEX-BLD
HIV 1 RNA Quant: <20 copies/mL (ref <20)
HIV-1 RNA Quant, Log: <1.3 {Log} (ref <1.30)

## 2015-03-17 ENCOUNTER — Ambulatory Visit (INDEPENDENT_AMBULATORY_CARE_PROVIDER_SITE_OTHER)
Admission: RE | Admit: 2015-03-17 | Discharge: 2015-03-17 | Disposition: A | Discharge status: Home / Self Care | Payer: 59 | Source: Ambulatory Visit | Attending: Family | Admitting: Family

## 2015-03-17 ENCOUNTER — Ambulatory Visit (INDEPENDENT_AMBULATORY_CARE_PROVIDER_SITE_OTHER): Payer: 59 | Admitting: Family

## 2015-03-17 ENCOUNTER — Encounter: Payer: Self-pay | Admitting: Family

## 2015-03-17 VITALS — BP 118/68 | HR 72 | Temp 98.1°F | Resp 18 | Ht 68.0 in | Wt 128.8 lb

## 2015-03-17 DIAGNOSIS — S4351XA Sprain of right acromioclavicular joint, initial encounter: Secondary | ICD-10-CM

## 2015-03-17 DIAGNOSIS — M25512 Pain in left shoulder: Secondary | ICD-10-CM | POA: Diagnosis not present

## 2015-03-17 DIAGNOSIS — S4350XA Sprain of unspecified acromioclavicular joint, initial encounter: Secondary | ICD-10-CM | POA: Insufficient documentation

## 2015-03-17 HISTORY — DX: Sprain of unspecified acromioclavicular joint, initial encounter: S43.50XA

## 2015-03-17 HISTORY — DX: Pain in left shoulder: M25.512

## 2015-03-17 NOTE — Assessment & Plan Note (Signed)
Left shoulder pain consistent with potential bursitis or rotator cuff pathology. Treat conservatively at this time strengthening exercises, ice/heat, and over-the-counter anti-inflammatories as needed for discomfort. Follow up if symptoms worsen or fail to improve. Possible referral to sports medicine or orthopedic surgery.

## 2015-03-17 NOTE — Progress Notes (Signed)
Subjective:    Patient ID: Frank Webster, male    DOB: 1976/07/09, 39 y.o.   MRN: [id number]  Chief Complaint  Patient presents with  . Shoulder Pain    has a lump on his right shoulder that he just noticed and he has been having trouble with his shoulder for a while, thinks its dislocated    HPI:  Frank BatmanGuillermo Pacer is a 39 y.o. male with a PMH of Vincent's angina, severe keratosis, HIV, depression, substance abuse, and otitis externa who presents today for an acute office visit.   Shoulder problem - Associated symptoms of shoulder pain located in bilateral shoulder pain has been going on for about 1 year. Indicates that he sleeps with his arms above his head and notes that his shoulders have been sore. Modifying factors include strengthening exercises and yoga which has helped some. Newly associated bump located on his right shoulder has been going on for about 4 days. Pain is described as a dull ache that is more of a discomfort which is worsened by certain movements including overhead activities. Severity is enough to wake him from sleep. Will occasionally feels a cracking or popping. He does work as a Production assistant, radioserver and is still able to complete his work.   No Known Allergies  Current Outpatient Prescriptions on File Prior to Visit  Medication Sig Dispense Refill  . elvitegravir-cobicistat-emtricitabine-tenofovir (STRIBILD) 150-150-200-300 MG TABS tablet Take 1 tablet by mouth daily with breakfast. 30 tablet 11  . pravastatin (PRAVACHOL) 40 MG tablet Take 1 tablet (40 mg total) by mouth daily. 30 tablet 2   No current facility-administered medications on file prior to visit.    Review of Systems  Constitutional: Negative for fever and chills.  Musculoskeletal: Positive for arthralgias (Bilateral shoulder pain) and neck pain. Negative for neck stiffness.  Neurological: Negative for numbness.      Objective:    BP 118/68 mmHg  Pulse 72  Temp(Src) 98.1 F (36.7 C) (Oral)  Resp 18   Ht 5\' 8"  (1.727 m)  Wt 128 lb 12.8 oz (58.423 kg)  BMI 19.59 kg/m2  SpO2 97% Nursing note and vital signs reviewed.  Physical Exam  Constitutional: He is oriented to person, place, and time. He appears well-developed and well-nourished. No distress.  Cardiovascular: Normal rate, regular rhythm, normal heart sounds and intact distal pulses.   Pulmonary/Chest: Effort normal and breath sounds normal.  Musculoskeletal:  Right shoulder: Obvious deformity around the acromioclavicular joint. No obvious edema or discoloration. Palpable tenderness along the acromioclavicular joint and supraspinatus insertion. Range of motion is full with discomfort over her head. Negative apprehension positive piano key sign.   Left shoulder: No obvious deformity, discoloration, or edema noted. Palpable tenderness elicited over supraspinatus tendon and body of the scapula. Negative apprehension, negative impingement and negative empty can.  Neurological: He is alert and oriented to person, place, and time.  Skin: Skin is warm and dry.  Psychiatric: He has a normal mood and affect. His behavior is normal. Judgment and thought content normal.       Assessment & Plan:   Problem List Items Addressed This Visit      Musculoskeletal and Integument   Sprain of acromioclavicular joint - Primary    Symptoms and exam consistent with sprain of acromioclavicular joint. Obtain x-ray to determine the amount of separation. Refer to sports medicine or orthopedic surgery pending x-ray results. Treat conservatively at this time with ice/heat, strengthening exercises, and over-the-counter anti-inflammatories as needed for discomfort. Follow up  if symptoms worsen or fail to improve.      Relevant Orders   DG Shoulder Right (Completed)   Ambulatory referral to Sports Medicine     Other   Left shoulder pain    Left shoulder pain consistent with potential bursitis or rotator cuff pathology. Treat conservatively at this time  strengthening exercises, ice/heat, and over-the-counter anti-inflammatories as needed for discomfort. Follow up if symptoms worsen or fail to improve. Possible referral to sports medicine or orthopedic surgery.      Relevant Orders   Ambulatory referral to Sports Medicine

## 2015-03-17 NOTE — Assessment & Plan Note (Signed)
Symptoms and exam consistent with sprain of acromioclavicular joint. Obtain x-ray to determine the amount of separation. Refer to sports medicine or orthopedic surgery pending x-ray results. Treat conservatively at this time with ice/heat, strengthening exercises, and over-the-counter anti-inflammatories as needed for discomfort. Follow up if symptoms worsen or fail to improve.

## 2015-03-17 NOTE — Progress Notes (Signed)
Pre visit review using our clinic review tool, if applicable. No additional management support is needed unless otherwise documented below in the visit note. 

## 2015-03-17 NOTE — Patient Instructions (Signed)
Acromioclavicular Injuries The AC (acromioclavicular) joint is the joint in the shoulder where the collarbone (clavicle) meets the shoulder blade (scapula). The part of the shoulder blade connected to the collarbone is called the acromion. Common problems with and treatments for the [facility] joint are detailed below. ARTHRITIS Arthritis occurs when the joint has been injured and the smooth padding between the joints (cartilage) is lost. This is the wear and tear seen in most joints of the body if they have been overused. This causes the joint to produce pain and swelling which is worse with activity.  AC JOINT SEPARATION AC joint separation means that the ligaments connecting the acromion of the shoulder blade and collarbone have been damaged, and the two bones no longer line up. AC separations can be anywhere from mild to severe, and are "graded" depending upon which ligaments are torn and how badly they are torn.  Grade I Injury: the least damage is done, and the [facility] joint still lines up.  Grade II Injury: damage to the ligaments which reinforce the [facility] joint. In a Grade II injury, these ligaments are stretched but not entirely torn. When stressed, the [facility] joint becomes painful and unstable.  Grade III Injury: AC and secondary ligaments are completely torn, and the collarbone is no longer attached to the shoulder blade. This results in deformity; a prominence of the end of the clavicle. AC JOINT FRACTURE AC joint fracture means that there has been a break in the bones of the [facility] joint, usually the end of the clavicle. TREATMENT TREATMENT OF AC ARTHRITIS  There is currently no way to replace the cartilage damaged by arthritis. The best way to improve the condition is to decrease the activities which aggravate the problem. Application of ice to the joint helps decrease pain and soreness (inflammation). The use of non-steroidal anti-inflammatory medication is helpful.  If less conservative measures do not  work, then cortisone shots (injections) may be used. These are anti-inflammatories; they decrease the soreness in the joint and swelling.  If non-surgical measures fail, surgery may be recommended. The procedure is generally removal of a portion of the end of the clavicle. This is the part of the collarbone closest to your acromion which is stabilized with ligaments to the acromion of the shoulder blade. This surgery may be performed using a tube-like instrument with a light (arthroscope) for looking into a joint. It may also be performed as an open surgery through a small incision by the surgeon. Most patients will have good range of motion within 6 weeks and may return to all activity including sports by 8-12 weeks, barring complications. TREATMENT OF AN AC SEPARATION  The initial treatment is to decrease pain. This is best accomplished by immobilizing the arm in a sling and placing an ice pack to the shoulder for 20 to 30 minutes every 2 hours as needed. As the pain starts to subside, it is important to begin moving the fingers, wrist, elbow and eventually the shoulder in order to prevent a stiff or "frozen" shoulder. Instruction on when and how much to move the shoulder will be provided by your caregiver. The length of time needed to regain full motion and function depends on the amount or grade of the injury. Recovery from a Grade I AC separation usually takes 10 to 14 days, whereas a Grade III may take 6 to 8 weeks.  Grade I and II separations usually do not require surgery. Even Grade III injuries usually allow return to full  activity with few restrictions. Treatment is also based on the activity demands of the injured shoulder. For example, a high level quarterback with an injured throwing arm will receive more aggressive treatment than someone with a desk job who rarely uses his/her arm for strenuous activities. In some cases, a painful lump may persist which could require a later surgery. Surgery  can be very successful, but the benefits must be weighed against the potential risks. TREATMENT OF AN AC JOINT FRACTURE Fracture treatment depends on the type of fracture. Sometimes a splint or sling may be all that is required. Other times surgery may be required for repair. This is more frequently the case when the ligaments supporting the clavicle are completely torn. Your caregiver will help you with these decisions and together you can decide what will be the best treatment. HOME CARE INSTRUCTIONS   Apply ice to the injury for 15-20 minutes each hour while awake for 2 days. Put the ice in a plastic bag and place a towel between the bag of ice and skin.  If a sling has been applied, wear it constantly for as long as directed by your caregiver, even at night. The sling or splint can be removed for bathing or showering or as directed. Be sure to keep the shoulder in the same place as when the sling is on. Do not lift the arm.  If a figure-of-eight splint has been applied it should be tightened gently by another person every day. Tighten it enough to keep the shoulders held back. Allow enough room to place the index finger between the body and strap. Loosen the splint immediately if there is numbness or tingling in the hands.  Take over-the-counter or prescription medicines for pain, discomfort or fever as directed by your caregiver.  If you or your child has received a follow up appointment, it is very important to keep that appointment in order to avoid long term complications, chronic pain or disability. SEEK MEDICAL CARE IF:   The pain is not relieved with medications.  There is increased swelling or discoloration that continues to get worse rather than better.  You or your child has been unable to follow up as instructed.  There is progressive numbness and tingling in the arm, forearm or hand. SEEK IMMEDIATE MEDICAL CARE IF:   The arm is numb, cold or pale.  There is increasing pain  in the hand, forearm or fingers. MAKE SURE YOU:   Understand these instructions.  Will watch your condition.  Will get help right away if you are not doing well or get worse. Document Released: 07/11/2005 Document Revised: 12/24/2011 Document Reviewed: 01/03/2009 [facility] Patient Information 2015 [place], [place]. This information is not intended to replace advice given to you by your health care provider. Make sure you discuss any questions you have with your health care provider.  Impingement Syndrome, Rotator Cuff, Bursitis with Rehab Impingement syndrome is a condition that involves inflammation of the tendons of the rotator cuff and the subacromial bursa, that causes pain in the shoulder. The rotator cuff consists of four tendons and muscles that control much of the shoulder and upper arm function. The subacromial bursa is a fluid filled sac that helps reduce friction between the rotator cuff and one of the bones of the shoulder (acromion). Impingement syndrome is usually an overuse injury that causes swelling of the bursa (bursitis), swelling of the tendon (tendonitis), and/or a tear of the tendon (strain). Strains are classified into three categories. Grade 1  strains cause pain, but the tendon is not lengthened. Grade 2 strains include a lengthened ligament, due to the ligament being stretched or partially ruptured. With grade 2 strains there is still function, although the function may be decreased. Grade 3 strains include a complete tear of the tendon or muscle, and function is usually impaired. SYMPTOMS   Pain around the shoulder, often at the outer portion of the upper arm.  Pain that gets worse with shoulder function, especially when reaching overhead or lifting.  Sometimes, aching when not using the arm.  Pain that wakes you up at night.  Sometimes, tenderness, swelling, warmth, or redness over the affected area.  Loss of strength.  Limited motion of the shoulder, especially  reaching behind the back (to the back pocket or to unhook bra) or across your body.  Crackling sound (crepitation) when moving the arm.  Biceps tendon pain and inflammation (in the front of the shoulder). Worse when bending the elbow or lifting. CAUSES  Impingement syndrome is often an overuse injury, in which chronic (repetitive) motions cause the tendons or bursa to become inflamed. A strain occurs when a force is paced on the tendon or muscle that is greater than it can withstand. Common mechanisms of injury include: Stress from sudden increase in duration, frequency, or intensity of training.  Direct hit (trauma) to the shoulder.  Aging, erosion of the tendon with normal use.  Bony bump on shoulder (acromial spur). RISK INCREASES WITH:  Contact sports (football, wrestling, boxing).  Throwing sports (baseball, tennis, volleyball).  Weightlifting and bodybuilding.  Heavy labor.  Previous injury to the rotator cuff, including impingement.  Poor shoulder strength and flexibility.  Failure to warm up properly before activity.  Inadequate protective equipment.  Old age.  Bony bump on shoulder (acromial spur). PREVENTION   Warm up and stretch properly before activity.  Allow for adequate recovery between workouts.  Maintain physical fitness:  Strength, flexibility, and endurance.  Cardiovascular fitness.  Learn and use proper exercise technique. PROGNOSIS  If treated properly, impingement syndrome usually goes away within 6 weeks. Sometimes surgery is required.  RELATED COMPLICATIONS   Longer healing time if not properly treated, or if not given enough time to heal.  Recurring symptoms, that result in a chronic condition.  Shoulder stiffness, frozen shoulder, or loss of motion.  Rotator cuff tendon tear.  Recurring symptoms, especially if activity is resumed too soon, with overuse, with a direct blow, or when using poor technique. TREATMENT  Treatment first  involves the use of ice and medicine, to reduce pain and inflammation. The use of strengthening and stretching exercises may help reduce pain with activity. These exercises may be performed at home or with a therapist. If non-surgical treatment is unsuccessful after more than 6 months, surgery may be advised. After surgery and rehabilitation, activity is usually possible in 3 months.  MEDICATION  If pain medicine is needed, nonsteroidal anti-inflammatory medicines (aspirin and ibuprofen), or other minor pain relievers (acetaminophen), are often advised.  Do not take pain medicine for 7 days before surgery.  Prescription pain relievers may be given, if your caregiver thinks they are needed. Use only as directed and only as much as you need.  Corticosteroid injections may be given by your caregiver. These injections should be reserved for the most serious cases, because they may only be given a certain number of times. HEAT AND COLD  Cold treatment (icing) should be applied for 10 to 15 minutes every 2 to 3 hours  for inflammation and pain, and immediately after activity that aggravates your symptoms. Use ice packs or an ice massage.  Heat treatment may be used before performing stretching and strengthening activities prescribed by your caregiver, physical therapist, or athletic trainer. Use a heat pack or a warm water soak. SEEK MEDICAL CARE IF:   Symptoms get worse or do not improve in 4 to 6 weeks, despite treatment.  New, unexplained symptoms develop. (Drugs used in treatment may produce side effects.) EXERCISES  RANGE OF MOTION (ROM) AND STRETCHING EXERCISES - Impingement Syndrome (Rotator Cuff  Tendinitis, Bursitis) These exercises may help you when beginning to rehabilitate your injury. Your symptoms may go away with or without further involvement from your physician, physical therapist or athletic trainer. While completing these exercises, remember:   Restoring tissue flexibility helps  normal motion to return to the joints. This allows healthier, less painful movement and activity.  An effective stretch should be held for at least 30 seconds.  A stretch should never be painful. You should only feel a gentle lengthening or release in the stretched tissue. STRETCH - Flexion, Standing  Stand with good posture. With an underhand grip on your right / left hand, and an overhand grip on the opposite hand, grasp a broomstick or cane so that your hands are a little more than shoulder width apart.  Keeping your right / left elbow straight and shoulder muscles relaxed, push the stick with your opposite hand, to raise your right / left arm in front of your body and then overhead. Raise your arm until you feel a stretch in your right / left shoulder, but before you have increased shoulder pain.  Try to avoid shrugging your right / left shoulder as your arm rises, by keeping your shoulder blade tucked down and toward your mid-back spine. Hold for __________ seconds.  Slowly return to the starting position. Repeat __________ times. Complete this exercise __________ times per day. STRETCH - Abduction, Supine  Lie on your back. With an underhand grip on your right / left hand and an overhand grip on the opposite hand, grasp a broomstick or cane so that your hands are a little more than shoulder width apart.  Keeping your right / left elbow straight and your shoulder muscles relaxed, push the stick with your opposite hand, to raise your right / left arm out to the side of your body and then overhead. Raise your arm until you feel a stretch in your right / left shoulder, but before you have increased shoulder pain.  Try to avoid shrugging your right / left shoulder as your arm rises, by keeping your shoulder blade tucked down and toward your mid-back spine. Hold for __________ seconds.  Slowly return to the starting position. Repeat __________ times. Complete this exercise __________ times  per day. ROM - Flexion, Active-Assisted  Lie on your back. You may bend your knees for comfort.  Grasp a broomstick or cane so your hands are about shoulder width apart. Your right / left hand should grip the end of the stick, so that your hand is positioned "thumbs-up," as if you were about to shake hands.  Using your healthy arm to lead, raise your right / left arm overhead, until you feel a gentle stretch in your shoulder. Hold for __________ seconds.  Use the stick to assist in returning your right / left arm to its starting position. Repeat __________ times. Complete this exercise __________ times per day.  ROM - Internal Rotation, Supine  Lie on your back on a firm surface. Place your right / left elbow about 60 degrees away from your side. Elevate your elbow with a folded towel, so that the elbow and shoulder are the same height.  Using a broomstick or cane and your strong arm, pull your right / left hand toward your body until you feel a gentle stretch, but no increase in your shoulder pain. Keep your shoulder and elbow in place throughout the exercise.  Hold for __________ seconds. Slowly return to the starting position. Repeat __________ times. Complete this exercise __________ times per day. STRETCH - Internal Rotation  Place your right / left hand behind your back, palm up.  Throw a towel or belt over your opposite shoulder. Grasp the towel with your right / left hand.  While keeping an upright posture, gently pull up on the towel, until you feel a stretch in the front of your right / left shoulder.  Avoid shrugging your right / left shoulder as your arm rises, by keeping your shoulder blade tucked down and toward your mid-back spine.  Hold for __________ seconds. Release the stretch, by lowering your healthy hand. Repeat __________ times. Complete this exercise __________ times per day. ROM - Internal Rotation   Using an underhand grip, grasp a stick behind your back  with both hands.  While standing upright with good posture, slide the stick up your back until you feel a mild stretch in the front of your shoulder.  Hold for __________ seconds. Slowly return to your starting position. Repeat __________ times. Complete this exercise __________ times per day.  STRETCH - Posterior Shoulder Capsule   Stand or sit with good posture. Grasp your right / left elbow and draw it across your chest, keeping it at the same height as your shoulder.  Pull your elbow, so your upper arm comes in closer to your chest. Pull until you feel a gentle stretch in the back of your shoulder.  Hold for __________ seconds. Repeat __________ times. Complete this exercise __________ times per day. STRENGTHENING EXERCISES - Impingement Syndrome (Rotator Cuff Tendinitis, Bursitis) These exercises may help you when beginning to rehabilitate your injury. They may resolve your symptoms with or without further involvement from your physician, physical therapist or athletic trainer. While completing these exercises, remember:  Muscles can gain both the endurance and the strength needed for everyday activities through controlled exercises.  Complete these exercises as instructed by your physician, physical therapist or athletic trainer. Increase the resistance and repetitions only as guided.  You may experience muscle soreness or fatigue, but the pain or discomfort you are trying to eliminate should never worsen during these exercises. If this pain does get worse, stop and make sure you are following the directions exactly. If the pain is still present after adjustments, discontinue the exercise until you can discuss the trouble with your clinician.  During your recovery, avoid activity or exercises which involve actions that place your injured hand or elbow above your head or behind your back or head. These positions stress the tissues which you are trying to heal. STRENGTH - Scapular  Depression and Adduction   With good posture, sit on a firm chair. Support your arms in front of you, with pillows, arm rests, or on a table top. Have your elbows in line with the sides of your body.  Gently draw your shoulder blades down and toward your mid-back spine. Gradually increase the tension, without tensing the muscles along the top of  your shoulders and the back of your neck.  Hold for __________ seconds. Slowly release the tension and relax your muscles completely before starting the next repetition.  After you have practiced this exercise, remove the arm support and complete the exercise in standing as well as sitting position. Repeat __________ times. Complete this exercise __________ times per day.  STRENGTH - Shoulder Abductors, Isometric  With good posture, stand or sit about 4-6 inches from a wall, with your right / left side facing the wall.  Bend your right / left elbow. Gently press your right / left elbow into the wall. Increase the pressure gradually, until you are pressing as hard as you can, without shrugging your shoulder or increasing any shoulder discomfort.  Hold for __________ seconds.  Release the tension slowly. Relax your shoulder muscles completely before you begin the next repetition. Repeat __________ times. Complete this exercise __________ times per day.  STRENGTH - External Rotators, Isometric  Keep your right / left elbow at your side and bend it 90 degrees.  Step into a door frame so that the outside of your right / left wrist can press against the door frame without your upper arm leaving your side.  Gently press your right / left wrist into the door frame, as if you were trying to swing the back of your hand away from your stomach. Gradually increase the tension, until you are pressing as hard as you can, without shrugging your shoulder or increasing any shoulder discomfort.  Hold for __________ seconds.  Release the tension slowly. Relax your  shoulder muscles completely before you begin the next repetition. Repeat __________ times. Complete this exercise __________ times per day.  STRENGTH - Supraspinatus   Stand or sit with good posture. Grasp a __________ weight, or an exercise band or tubing, so that your hand is "thumbs-up," like you are shaking hands.  Slowly lift your right / left arm in a "V" away from your thigh, diagonally into the space between your side and straight ahead. Lift your hand to shoulder height or as far as you can, without increasing any shoulder pain. At first, many people do not lift their hands above shoulder height.  Avoid shrugging your right / left shoulder as your arm rises, by keeping your shoulder blade tucked down and toward your mid-back spine.  Hold for __________ seconds. Control the descent of your hand, as you slowly return to your starting position. Repeat __________ times. Complete this exercise __________ times per day.  STRENGTH - External Rotators  Secure a rubber exercise band or tubing to a fixed object (table, pole) so that it is at the same height as your right / left elbow when you are standing or sitting on a firm surface.  Stand or sit so that the secured exercise band is at your uninjured side.  Bend your right / left elbow 90 degrees. Place a folded towel or small pillow under your right / left arm, so that your elbow is a few inches away from your side.  Keeping the tension on the exercise band, pull it away from your body, as if pivoting on your elbow. Be sure to keep your body steady, so that the movement is coming only from your rotating shoulder.  Hold for __________ seconds. Release the tension in a controlled manner, as you return to the starting position. Repeat __________ times. Complete this exercise __________ times per day.  STRENGTH - Internal Rotators   Secure a rubber exercise band  or tubing to a fixed object (table, pole) so that it is at the same height as  your right / left elbow when you are standing or sitting on a firm surface.  Stand or sit so that the secured exercise band is at your right / left side.  Bend your elbow 90 degrees. Place a folded towel or small pillow under your right / left arm so that your elbow is a few inches away from your side.  Keeping the tension on the exercise band, pull it across your body, toward your stomach. Be sure to keep your body steady, so that the movement is coming only from your rotating shoulder.  Hold for __________ seconds. Release the tension in a controlled manner, as you return to the starting position. Repeat __________ times. Complete this exercise __________ times per day.  STRENGTH - Scapular Protractors, Standing   Stand arms length away from a wall. Place your hands on the wall, keeping your elbows straight.  Begin by dropping your shoulder blades down and toward your mid-back spine.  To strengthen your protractors, keep your shoulder blades down, but slide them forward on your rib cage. It will feel as if you are lifting the back of your rib cage away from the wall. This is a subtle motion and can be challenging to complete. Ask your caregiver for further instruction, if you are not sure you are doing the exercise correctly.  Hold for __________ seconds. Slowly return to the starting position, resting the muscles completely before starting the next repetition. Repeat __________ times. Complete this exercise __________ times per day. STRENGTH - Scapular Protractors, Supine  Lie on your back on a firm surface. Extend your right / left arm straight into the air while holding a __________ weight in your hand.  Keeping your head and back in place, lift your shoulder off the floor.  Hold for __________ seconds. Slowly return to the starting position, and allow your muscles to relax completely before starting the next repetition. Repeat __________ times. Complete this exercise __________ times  per day. STRENGTH - Scapular Protractors, Quadruped  Get onto your hands and knees, with your shoulders directly over your hands (or as close as you can be, comfortably).  Keeping your elbows locked, lift the back of your rib cage up into your shoulder blades, so your mid-back rounds out. Keep your neck muscles relaxed.  Hold this position for __________ seconds. Slowly return to the starting position and allow your muscles to relax completely before starting the next repetition. Repeat __________ times. Complete this exercise __________ times per day.  STRENGTH - Scapular Retractors  Secure a rubber exercise band or tubing to a fixed object (table, pole), so that it is at the height of your shoulders when you are either standing, or sitting on a firm armless chair.  With a palm down grip, grasp an end of the band in each hand. Straighten your elbows and lift your hands straight in front of you, at shoulder height. Step back, away from the secured end of the band, until it becomes tense.  Squeezing your shoulder blades together, draw your elbows back toward your sides, as you bend them. Keep your upper arms lifted away from your body throughout the exercise.  Hold for __________ seconds. Slowly ease the tension on the band, as you reverse the directions and return to the starting position. Repeat __________ times. Complete this exercise __________ times per day. STRENGTH - Shoulder Extensors   Secure a  rubber exercise band or tubing to a fixed object (table, pole) so that it is at the height of your shoulders when you are either standing, or sitting on a firm armless chair.  With a thumbs-up grip, grasp an end of the band in each hand. Straighten your elbows and lift your hands straight in front of you, at shoulder height. Step back, away from the secured end of the band, until it becomes tense.  Squeezing your shoulder blades together, pull your hands down to the sides of your thighs. Do  not allow your hands to go behind you.  Hold for __________ seconds. Slowly ease the tension on the band, as you reverse the directions and return to the starting position. Repeat __________ times. Complete this exercise __________ times per day.  STRENGTH - Scapular Retractors and External Rotators   Secure a rubber exercise band or tubing to a fixed object (table, pole) so that it is at the height as your shoulders, when you are either standing, or sitting on a firm armless chair.  With a palm down grip, grasp an end of the band in each hand. Bend your elbows 90 degrees and lift your elbows to shoulder height, at your sides. Step back, away from the secured end of the band, until it becomes tense.  Squeezing your shoulder blades together, rotate your shoulders so that your upper arms and elbows remain stationary, but your fists travel upward to head height.  Hold for __________ seconds. Slowly ease the tension on the band, as you reverse the directions and return to the starting position. Repeat __________ times. Complete this exercise __________ times per day.  STRENGTH - Scapular Retractors and External Rotators, Rowing   Secure a rubber exercise band or tubing to a fixed object (table, pole) so that it is at the height of your shoulders, when you are either standing, or sitting on a firm armless chair.  With a palm down grip, grasp an end of the band in each hand. Straighten your elbows and lift your hands straight in front of you, at shoulder height. Step back, away from the secured end of the band, until it becomes tense.  Step 1: Squeeze your shoulder blades together. Bending your elbows, draw your hands to your chest, as if you are rowing a boat. At the end of this motion, your hands and elbow should be at shoulder height and your elbows should be out to your sides.  Step 2: Rotate your shoulders, to raise your hands above your head. Your forearms should be vertical and your upper arms  should be horizontal.  Hold for __________ seconds. Slowly ease the tension on the band, as you reverse the directions and return to the starting position. Repeat __________ times. Complete this exercise __________ times per day.  STRENGTH - Scapular Depressors  Find a sturdy chair without wheels, such as a dining room chair.  Keeping your feet on the floor, and your hands on the chair arms, lift your bottom up from the seat, and lock your elbows.  Keeping your elbows straight, allow gravity to pull your body weight down. Your shoulders will rise toward your ears.  Raise your body against gravity by drawing your shoulder blades down your back, shortening the distance between your shoulders and ears. Although your feet should always maintain contact with the floor, your feet should progressively support less body weight, as you get stronger.  Hold for __________ seconds. In a controlled and slow manner, lower your  body weight to begin the next repetition. Repeat __________ times. Complete this exercise __________ times per day.  Document Released: 10/01/2005 Document Revised: 12/24/2011 Document Reviewed: 01/13/2009 [facility] Patient Information 2015 [place], [place]. This information is not intended to replace advice given to you by your health care provider. Make sure you discuss any questions you have with your health care provider.

## 2015-03-22 ENCOUNTER — Encounter: Payer: Self-pay | Admitting: Internal Medicine

## 2015-03-22 ENCOUNTER — Ambulatory Visit (INDEPENDENT_AMBULATORY_CARE_PROVIDER_SITE_OTHER): Payer: 59 | Admitting: Internal Medicine

## 2015-03-22 VITALS — BP 120/75 | HR 62 | Temp 97.7°F | Ht 68.0 in | Wt 129.0 lb

## 2015-03-22 DIAGNOSIS — B20 Human immunodeficiency virus [HIV] disease: Secondary | ICD-10-CM

## 2015-03-22 DIAGNOSIS — Z720 Tobacco use: Secondary | ICD-10-CM | POA: Diagnosis not present

## 2015-03-22 HISTORY — DX: Tobacco use: Z72.0

## 2015-03-22 MED ORDER — BUPROPION HCL ER (SR) 150 MG PO TB12: 150.0000 mg | ORAL_TABLET | Freq: Two times a day (BID) | ORAL | Status: DC

## 2015-03-22 NOTE — Progress Notes (Signed)
   Subjective:    Patient ID: Valeria BatmanGuillermo Villari, male    DOB: 1976/01/18, 39 y.o.   MRN: [id number]  HPI He comes in for follow up of HIV.  He previously was on Truvada and Isentress and well-controlled but relapsed with drug use.  He underwent counseling and rehab and continues to remain clean. He has been clean for several months and is followed closely with out substance abuse counselor.  He restarted his ARVs with Stribild and taking well with no missed doses.  His CD4 is stable at 580 and viral load 20 from 58,482.  No missed doses.  Has had some poor appetite. Weight stable.    Review of Systems  Constitutional: Negative for fatigue and unexpected weight change.  Respiratory: Negative for shortness of breath.   Genitourinary: Negative for discharge and genital sores.  Skin: Negative for rash.  Neurological: Negative for dizziness and light-headedness.  Hematological: Negative for adenopathy.       Objective:   Physical Exam  Constitutional: He appears well-developed and well-nourished. No distress.  HENT:  Mouth/Throat: No oropharyngeal exudate.  Eyes: No scleral icterus.  Cardiovascular: Normal rate, regular rhythm and normal heart sounds.   No murmur heard. Pulmonary/Chest: Effort normal and breath sounds normal. No respiratory distress.  Lymphadenopathy:    He has no cervical adenopathy.  Skin: No rash noted.          Assessment & Plan:

## 2015-03-22 NOTE — Addendum Note (Signed)
Addended by: Gardiner BarefootOMER, Imya Mance W on: 03/22/2015 10:56 AM   Modules accepted: Orders

## 2015-03-24 ENCOUNTER — Ambulatory Visit (INDEPENDENT_AMBULATORY_CARE_PROVIDER_SITE_OTHER): Payer: 59 | Admitting: Family Medicine

## 2015-03-24 ENCOUNTER — Other Ambulatory Visit (INDEPENDENT_AMBULATORY_CARE_PROVIDER_SITE_OTHER): Payer: 59

## 2015-03-24 ENCOUNTER — Encounter: Payer: Self-pay | Admitting: Family Medicine

## 2015-03-24 VITALS — BP 120/70 | HR 85 | Ht 68.0 in | Wt 129.0 lb

## 2015-03-24 DIAGNOSIS — S4351XA Sprain of right acromioclavicular joint, initial encounter: Secondary | ICD-10-CM

## 2015-03-24 DIAGNOSIS — G2589 Other specified extrapyramidal and movement disorders: Secondary | ICD-10-CM | POA: Diagnosis not present

## 2015-03-24 DIAGNOSIS — M25511 Pain in right shoulder: Secondary | ICD-10-CM | POA: Diagnosis not present

## 2015-03-24 DIAGNOSIS — M25512 Pain in left shoulder: Secondary | ICD-10-CM | POA: Diagnosis not present

## 2015-03-24 HISTORY — DX: Other specified extrapyramidal and movement disorders: G25.89

## 2015-03-24 NOTE — Progress Notes (Signed)
Pre visit review using our clinic review tool, if applicable. No additional management support is needed unless otherwise documented below in the visit note. 

## 2015-03-24 NOTE — Patient Instructions (Signed)
Good to see you.  Ice 20 minutes 2 times daily. Usually after activity and before bed. Exercises 3 times a week.  Trapezius rhomboids and other scapular stabilizing muscles pennsaid pinkie amount topically 2 times daily as needed.  Duexis 3 times a day for 6 days  OK to go back to gym but 50% of what you were doing and only 90 degree bend in elbow.  Keep hand within peripheral vision.  See me again in 3 weeks.    Scapular Winging  with Rehab  Scapular winging syndrome is also known as serratus anterior palsy or long thoracic nerve injury. The condition is an uncommon injury to the nervous system. The condition is caused by injury to the long thoracic nerve that runs through the neck and shoulder. Injury to the shoulder, such as a fall or repetitive stress on the shoulder causes the nerve to become stretched. Occasionally the injury is the result of an infection of the nerve. Damage to the long thoracic nerve results in weakness of the serratus anterior muscle. The serratus anterior muscle is responsible for controlling the shoulder blade (scapula). Weakness in this muscle results in a instability (winging) of the scapula. SYMPTOMS   Pain and weakness in the shoulder (usually the back of the shoulder) that is often diffuse or unable to localize.  Loss of or decrease in shoulder function.  Upper back pain while sitting, due to the scapula pressing on the back of the chair.  Visible deformity in the back of the shoulder. CAUSES  Scapular winging is caused by stretching of the long thoracic nerve. Common mechanisms of injury include:  Viral illness.  Repetitive and/or stressful use of the shoulder.  Falling onto the shoulder with the head and neck stretched away from the shoulder. RISK INCREASES WITH:  Contact sports (football, rugby, lacrosse, or soccer).  Activities involving overhead arm movement (baseball, volleyball, or racquet sports).  Poor strength and  flexibility. PREVENTION  Warm up and stretch properly before activity.  Allow for adequate recovery between workouts.  Maintain physical fitness:  Strength, flexibility, and endurance.  Cardiovascular fitness.  Learn and use proper technique. When possible, have a coach correct improper technique. PROGNOSIS  Scapular winging normally resolves spontaneously within 18 months. In rare circumstances surgery is recommended.  RELATED COMPLICATIONS   Permanent nerve damage, including pain, numbness, tingle, or weakness.  Shoulder weakness.  Recurrent shoulder pain.  Inability to compete in athletics. TREATMENT Treatment initially involves resting from any activities that aggravate your symptoms. The use of ice and medication may help reduce pain and inflammation. The use of strengthening and stretching exercises may help reduce pain with activity, specifically shoulder exercises that improve range of motion. These exercises may be performed at home or with referral to a therapist. If symptoms persist for greater than 6 months despite non-surgical (conservative) treatment, then surgery may be recommended. Surgery is only used for the most serious cases and the purpose is to regain function, not to allow an athlete to return to sports. MEDICATION   If pain medication is necessary, then nonsteroidal anti-inflammatory medications, such as aspirin and ibuprofen, or other minor pain relievers, such as acetaminophen, are often recommended.  Do not take pain medication for 7 days before surgery.  Prescription pain relievers may be given if deemed necessary by your caregiver. Use only as directed and only as much as you need. HEAT AND COLD  Cold treatment (icing) relieves pain and reduces inflammation. Cold treatment should be applied for 10  to 15 minutes every 2 to 3 hours for inflammation and pain and immediately after any activity that aggravates your symptoms. Use ice packs or massage the  area with a piece of ice (ice massage).  Heat treatment may be used prior to performing the stretching and strengthening activities prescribed by your caregiver, physical therapist, or athletic trainer. Use a heat pack or soak the injury in warm water. SEEK MEDICAL CARE IF:  Treatment seems to offer no benefit, or the condition worsens.  Any medications produce adverse side effects. Standing:  Secure a rubber exercise band/tubing so that it is at the height of your shoulders when you are either standing or sitting on a firm arm-less chair.  Grasp an end of the band/tubing in each hand and have your palms face each other. Straighten your elbows and lift your hands straight in front of you at shoulder height. Step back away from the secured end of band/tubing until it becomes tense.  Squeeze your shoulder blades together. Keeping your elbows locked and your hands at shoulder-height, bring your hands out to your side.  Hold __________ seconds. Slowly ease the tension on the band/tubing as you reverse the directions and return to the starting position. Repeat __________ times. Complete this exercise __________ times per day. STRENGTH - Scapular Retractors  Secure a rubber exercise band/tubing so that it is at the height of your shoulders when you are either standing or sitting on a firm arm-less chair.  With a palm-down grip, grasp an end of the band/tubing in each hand. Straighten your elbows and lift your hands straight in front of you at shoulder height. Step back away from the secured end of band/tubing until it becomes tense.  Squeezing your shoulder blades together, draw your elbows back as you bend them. Keep your upper arm lifted away from your body throughout the exercise.  Hold __________ seconds. Slowly ease the tension on the band/tubing as you reverse the directions and return to the starting position. Repeat __________ times. Complete this exercise __________ times per  day. STRENGTH - Shoulder Extensors   Secure a rubber exercise band/tubing so that it is at the height of your shoulders when you are either standing or sitting on a firm arm-less chair.  With a thumbs-up grip, grasp an end of the band/tubing in each hand. Straighten your elbows and lift your hands straight in front of you at shoulder height. Step back away from the secured end of band/tubing until it becomes tense.  Squeezing your shoulder blades together, pull your hands down to the sides of your thighs. Do not allow your hands to go behind you.  Hold for __________ seconds. Slowly ease the tension on the band/tubing as you reverse the directions and return to the starting position. Repeat __________ times. Complete this exercise __________ times per day.  STRENGTH - Scapular Retractors and External Rotators  Secure a rubber exercise band/tubing so that it is at the height of your shoulders when you are either standing or sitting on a firm arm-less chair.  With a palm-down grip, grasp an end of the band/tubing in each hand. Bend your elbows 90 degrees and lift your elbows to shoulder height at your sides. Step back away from the secured end of band/tubing until it becomes tense.  Squeezing your shoulder blades together, rotate your shoulder so that your upper arm and elbow remain stationary, but your fists travel upward to head-height.  Hold __________ for seconds. Slowly ease the tension on the  band/tubing as you reverse the directions and return to the starting position. Repeat __________ times. Complete this exercise __________ times per day.  STRENGTH - Scapular Retractors and External Rotators, Rowing  Secure a rubber exercise band/tubing so that it is at the height of your shoulders when you are either standing or sitting on a firm arm-less chair.  With a palm-down grip, grasp an end of the band/tubing in each hand. Straighten your elbows and lift your hands straight in front of you  at shoulder height. Step back away from the secured end of band/tubing until it becomes tense.  Step 1: Squeeze your shoulder blades together. Bending your elbows, draw your hands to your chest as if you are rowing a boat. At the end of this motion, your hands and elbow should be at shoulder-height and your elbows should be out to your sides.  Step 2: Rotate your shoulder to raise your hands above your head. Your forearms should be vertical and your upper-arms should be horizontal.  Hold for __________ seconds. Slowly ease the tension on the band/tubing as you reverse the directions and return to the starting position. Repeat __________ times. Complete this exercise __________ times per day.  STRENGTH - Scapular Retractors and Elevators  Secure a rubber exercise band/tubing so that it is at the height of your shoulders when you are either standing or sitting on a firm arm-less chair.  With a thumbs-up grip, grasp an end of the band/tubing in each hand. Step back away from the secured end of band/tubing until it becomes tense.  Squeezing your shoulder blades together, straighten your elbows and lift your hands straight over your head.  Hold for __________ seconds. Slowly ease the tension on the band/tubing as you reverse the directions and return to the starting position. Repeat __________ times. Complete this exercise __________ times per day.  Document Released: 10/01/2005 Document Revised: 12/24/2011 Document Reviewed: 01/13/2009 [facility] Patient Information 2015 [place], [place]. This information is not intended to replace advice given to you by your health care provider. Make sure you discuss any questions you have with your health care provider.

## 2015-03-24 NOTE — Assessment & Plan Note (Signed)
Patient does have a small sprain of the acromioclavicular joint that could be contributing to some of his discomfort. Patient given a topical anti-inflammatory. We discussed icing regimen and home exercises. We discussed what activities to potentially avoid. Patient also has some moderate arthritis in this joint will also be likely chronic. Patient will come back in 3-4 weeks and if any worsening symptoms we can do an injection if needed.

## 2015-03-24 NOTE — Assessment & Plan Note (Signed)
I do believe the patient scapular is also having significant delay which is causing some instability of the shoulders bilaterally. We discussed about postural changes in patient given home exercise program that I think will be beneficial. Patient will do a short course of anti-inflammatory cystic decrease any inflammation that could also be contribute. Patient and will follow-up with me again in 3-4 weeks for further evaluation. Patient continues to have pain he may be a candidate for more postural training as well as osteopathic manipulation. We'll discuss with patient at follow-up.

## 2015-03-24 NOTE — Progress Notes (Signed)
[facility] [address] [place], [place] [address] Phone: [phone] Subjective:    I'm seeing this patient by the request  of:  Jeanine Luz, FNP   CC: ` Bilateral shoulder pain right greater than left.  [id number] Frank Webster is a 39 y.o. male coming in with complaint of bilateral shoulder pain right greater than left. Patient states that the right shoulder has been hurting him for quite some time. Patient was seen by primary care provider and he was to have x-rays. 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 Patient has been concerned because there has been a bump on his shoulder as well. Bump on the shoulder is moderately tender. Patient is stopped going to the gym on a regular basis disease concern that is make it worse. Patient describes the pain more on the posterior aspect of the shoulders bilaterally as well. As a dull aching sensation. Patient states sometimes though it can be a sharp pain as well. Can be very uncomfortable at night. Does not remember any specific injury.  Past Medical History  Diagnosis Date  . HIV infection   . Depression   . Substance abuse    Past Surgical History  Procedure Laterality Date  . Testicle torsion reduction     History  Substance Use Topics  . Smoking status: Current Every Day Smoker -- 0.75 packs/day for 25 years    Types: Cigarettes  . Smokeless tobacco: Never Used     Comment: wants to discuss options today  . 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 Arthritis Neg Hx   . Lupus Neg Hx   . Rosacea Neg Hx   . Cancer Maternal Grandmother        Past medical history, social, surgical and family history all reviewed in electronic medical record.   Review of Systems: No headache, visual changes, nausea, vomiting, diarrhea, constipation, dizziness, abdominal  pain, skin rash, fevers, chills, night sweats, weight loss, swollen lymph nodes, body aches, joint swelling, muscle aches, chest pain, shortness of breath, mood changes.   Objective Blood pressure 120/70, pulse 85, height  (1.727 m), weight 129 lb (58.514 kg), SpO2 98 %.  General: No apparent distress alert and oriented x3 mood and affect normal, dressed appropriately.  HEENT: Pupils equal, extraocular movements intact  Respiratory: Patient's speak in full sentences and does not appear short of breath  Cardiovascular: No lower extremity edema, non tender, no erythema  Skin: Warm dry intact with no signs of infection or rash on extremities or on axial skeleton.  Abdomen: Soft nontender  Neuro: Cranial nerves II through XII are intact, neurovascularly intact in all extremities with 2+ DTRs and 2+ pulses.  Lymph: No lymphadenopathy of posterior or anterior cervical chain or axillae bilaterally.  Gait normal with good balance and coordination.  MSK:  Non tender with full range of motion and good stability and symmetric strength and tone of shoulders, elbows, wrist, hip, knee and ankles bilaterally.  Shoulder: Right Inspection reveals no abnormalities, atrophy or asymmetry. Mild tenderness over the acromial clavicular joint. ROM is full in all planes passively. Rotator cuff strength normal throughout. signs of impingement with positive Neer and Hawkin's tests, but negative empty can sign. Speeds and Yergason's tests normal. No labral pathology noted with negative Obrien's, negative clunk and good stability. Significant delay in  the scapular spine bilaterally with abduction No painful arc and no drop arm sign. No apprehension sign  MSK US performed of: Right This study was ordered, performed, and interpreted by Terrilee Files D.O.  Shoulder:   Supraspinatus:  Appears normal on long and transverse views, small bursal bulge noted Infraspinatus:  Appears normal on long and transverse views.    Subscapularis:  Appears normal on long and transverse views. Positive bursa Teres Minor:  Appears normal on long and transverse views. AC joint:  Moderate to severe arthritis Glenohumeral Joint:  Appears normal without effusion. Glenoid Labrum:  Intact without visualized tears. Biceps Tendon:  Appears normal on long and transverse views, no fraying of tendon, tendon located in intertubercular groove, no subluxation with shoulder internal or external rotation.  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

## 2015-04-14 ENCOUNTER — Encounter: Payer: Self-pay | Admitting: Family Medicine

## 2015-04-14 ENCOUNTER — Ambulatory Visit (INDEPENDENT_AMBULATORY_CARE_PROVIDER_SITE_OTHER): Payer: 59 | Admitting: Family Medicine

## 2015-04-14 VITALS — BP 114/62 | HR 60 | Ht 68.0 in | Wt 129.0 lb

## 2015-04-14 DIAGNOSIS — M999 Biomechanical lesion, unspecified: Secondary | ICD-10-CM | POA: Insufficient documentation

## 2015-04-14 DIAGNOSIS — M9903 Segmental and somatic dysfunction of lumbar region: Secondary | ICD-10-CM

## 2015-04-14 DIAGNOSIS — M9901 Segmental and somatic dysfunction of cervical region: Secondary | ICD-10-CM

## 2015-04-14 DIAGNOSIS — M9902 Segmental and somatic dysfunction of thoracic region: Secondary | ICD-10-CM

## 2015-04-14 DIAGNOSIS — G2589 Other specified extrapyramidal and movement disorders: Secondary | ICD-10-CM

## 2015-04-14 DIAGNOSIS — S4351XD Sprain of right acromioclavicular joint, subsequent encounter: Secondary | ICD-10-CM | POA: Diagnosis not present

## 2015-04-14 HISTORY — DX: Biomechanical lesion, unspecified: M99.9

## 2015-04-14 NOTE — Patient Instructions (Signed)
Good to see you Ice is your friend after activity Tylenol when you need it Lets clarify a couple of the exercises Continue to stay active.  No real restrictions.  Protein 1gram per pound daily Eat within 30 minutes of exercises See me again in 4 weeks

## 2015-04-14 NOTE — Assessment & Plan Note (Signed)
Decision today to treat with OMT was based on Physical Exam  After verbal consent patient was treated with HVLA, ME, FPR techniques in the vehicle, thoracic, lumbar areas  Patient tolerated the procedure well with improvement in symptoms  Patient given exercises, stretches and lifestyle modifications  See medications in patient instructions if given  Patient will follow up in 4 weeks

## 2015-04-14 NOTE — Progress Notes (Signed)
Pre visit review using our clinic review tool, if applicable. No additional management support is needed unless otherwise documented below in the visit note. 

## 2015-04-14 NOTE — Assessment & Plan Note (Signed)
Some of the discomfort is still from the moderate osteophytic changes in this area. Patient will continue to monitor. We can try an injection if necessary.

## 2015-04-14 NOTE — Assessment & Plan Note (Signed)
Still believe that this is patient's underlying problem. We'll continue to monitor closely. Patient did have a refresher with the athletic trainer today. We discussed icing regimen and what activities to potentially avoid. We discussed proper technique of yoga stance. Patient did respond well to osteopathic manipulation will come back again in 4 weeks for further evaluation and treatment.

## 2015-04-14 NOTE — Progress Notes (Signed)
Frank ScaleZach Levana Webster D.O. [facility] [address] [place], [place] [address] Phone: [phone] Subjective:    I'm seeing this patient by the request  of:  Jeanine Luzalone, Gregory, FNP   CC: ` Bilateral shoulder pain right greater than left.  DGU:[id number]:Subjective Frank BatmanGuillermo Webster is a 39 y.o. male coming in with complaint of bilateral shoulder pain right greater than left. Patient states that the right shoulder has been hurting him for quite some time. 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  Patient was treated for more scapular dyskinesia. Patient states that since doing the exercises he has had a decrease of pain in the proximal a 70%. Patient denies any significant worsening symptoms. Still has some mild discomfort in the shoulders bilaterally with certain movements. Patient states though that the back pain in the neck pain seems to be a little bit better. Patient can do activities daily living and is sleeping more comfortably at night. Denies get all the over-the-counter medications. Doing the exercises though fairly religiously and has started yoga more regularly. Still discomfort over the acromial clavicular joint with pushups.   Past Medical History  Diagnosis Date  . HIV infection   . Depression   . Substance abuse    Past Surgical History  Procedure Laterality Date  . Testicle torsion reduction     History  Substance Use Topics  . Smoking status: Current Every Day Smoker -- 0.75 packs/day for 25 years    Types: Cigarettes  . Smokeless tobacco: Never Used     Comment: wants to discuss options today  . 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 Arthritis Neg Hx   . Lupus Neg Hx   . Rosacea Neg Hx   . Cancer Maternal Grandmother        Past medical history, social, surgical and family history all reviewed in  electronic medical record.   Review of Systems: No headache, visual changes, nausea, vomiting, diarrhea, constipation, dizziness, abdominal pain, skin rash, fevers, chills, night sweats, weight loss, swollen lymph nodes, body aches, joint swelling, muscle aches, chest pain, shortness of breath, mood changes.   Objective Blood pressure 114/62, pulse 60, height 5\' 8"  (1.727 m), weight 129 lb (58.514 kg), SpO2 99 %.  General: No apparent distress alert and oriented x3 mood and affect normal, dressed appropriately.  HEENT: Pupils equal, extraocular movements intact  Respiratory: Patient's speak in full sentences and does not appear short of breath  Cardiovascular: No lower extremity edema, non tender, no erythema  Skin: Warm dry intact with no signs of infection or rash on extremities or on axial skeleton.  Abdomen: Soft nontender  Neuro: Cranial nerves II through XII are intact, neurovascularly intact in all extremities with 2+ DTRs and 2+ pulses.  Lymph: No lymphadenopathy of posterior or anterior cervical chain or axillae bilaterally.  Gait normal with good balance and coordination.  MSK:  Non tender with full range of motion and good stability and symmetric strength and tone of shoulders, elbows, wrist, hip, knee and ankles bilaterally.   Shoulder: bilateral Still shows signs of acromioclavicular arthritis on the right side still tender to palpation in this area. ROM is full in all planes. Rotator cuff strength normal throughout. No signs of impingement with negative Neer and Hawkin's tests, empty can sign. Speeds and Yergason's tests normal.  No labral pathology noted with negative Obrien's, negative clunk and good stability. Still mild dysfunction of the scapulas bilaterally No painful arc and no drop arm sign. No apprehension sign   Osteopathic findings C2 flexed rotated and side bent right C7 flexed rotated and side bent left T3 extended rotated and side bent right L2 flexed  rotated and side bent left      Impression and Recommendations:     This case required medical decision making of moderate complexity.

## 2015-04-21 ENCOUNTER — Ambulatory Visit (INDEPENDENT_AMBULATORY_CARE_PROVIDER_SITE_OTHER): Payer: 59 | Admitting: Family

## 2015-04-21 ENCOUNTER — Encounter: Payer: Self-pay | Admitting: Family

## 2015-04-21 VITALS — BP 100/78 | HR 67 | Temp 97.6°F | Resp 18 | Ht 68.0 in | Wt 129.0 lb

## 2015-04-21 DIAGNOSIS — N508 Other specified disorders of male genital organs: Secondary | ICD-10-CM

## 2015-04-21 DIAGNOSIS — E785 Hyperlipidemia, unspecified: Secondary | ICD-10-CM | POA: Diagnosis not present

## 2015-04-21 DIAGNOSIS — H6090 Unspecified otitis externa, unspecified ear: Secondary | ICD-10-CM | POA: Insufficient documentation

## 2015-04-21 DIAGNOSIS — H6092 Unspecified otitis externa, left ear: Secondary | ICD-10-CM | POA: Diagnosis not present

## 2015-04-21 DIAGNOSIS — R000 Tachycardia, unspecified: Secondary | ICD-10-CM | POA: Diagnosis not present

## 2015-04-21 DIAGNOSIS — IMO0002 Reserved for concepts with insufficient information to code with codable children: Secondary | ICD-10-CM

## 2015-04-21 HISTORY — DX: Tachycardia, unspecified: R00.0

## 2015-04-21 HISTORY — DX: Reserved for concepts with insufficient information to code with codable children: IMO0002

## 2015-04-21 MED ORDER — NEOMYCIN-POLYMYXIN-HC 3.5-10000-1 OT SUSP: 4.0000 [drp] | Freq: Three times a day (TID) | OTIC | Status: DC

## 2015-04-21 MED ORDER — PRAVASTATIN SODIUM 40 MG PO TABS: 40.0000 mg | ORAL_TABLET | Freq: Every day | ORAL | Status: DC

## 2015-04-21 MED ORDER — LEVOFLOXACIN 500 MG PO TABS: 500.0000 mg | ORAL_TABLET | Freq: Every day | ORAL | Status: DC

## 2015-04-21 NOTE — Progress Notes (Signed)
Pre visit review using our clinic review tool, if applicable. No additional management support is needed unless otherwise documented below in the visit note. 

## 2015-04-21 NOTE — Assessment & Plan Note (Signed)
Ear pain symptoms and exam consistent with otitis externa. Start cortisporin. Follow up if symptoms worsen or do not improve with treatment.

## 2015-04-21 NOTE — Assessment & Plan Note (Signed)
In office EKG reveals normal sinus rhythm with occasional PVCs. Asymptomatic currently. Discussed reducing caffeine and stress intake to treat conservatively. If frequency increases or symptoms develop will refer to cardiology for further evaluation.

## 2015-04-21 NOTE — Progress Notes (Signed)
Subjective:    Patient ID: Frank Webster, male    DOB: 29-Mar-1976, 39 y.o.   MRN: [id number]  Chief Complaint  Patient presents with  . Mass    did a testicular self exam and found a hard lump in one of his testicles and later felt one in the other one, did have a testiclular torsion years ago    HPI:  Frank Webster is a 39 y.o. male with a PMH of hemorrhoid, substance abuse, tobacco abuse, HIV, and seasonal allergies who presents today for an office visit.  1.) Testicular mass - This is a new problem. Associated symptom of a  lump located in one of his testicles has been going on for approximately one week following a self testicular exam. Describes the lump of hard and non-mobile with some discomfort. Reports that he did have testicular torsion several years ago it was anchored. Denies any modifying factors that make it better or worse. There has been no increase in size or shape of the lump since it was first noted.  2.) Left ear - Associated symptom of pain located on the outer part of his left ear has been going on for about 1 day. Denies any modifying factors that make it better or worse.   3.) Racing heartbeat - Associated symptom of racing heartbeat has been going on for a couple of weeks and notes it when he is laying. During the episodes there is no shortness of breath or chest pain. Does resolve on its on after about 30 minutes.    No Known Allergies   Current Outpatient Prescriptions on File Prior to Visit  Medication Sig Dispense Refill  . buPROPion (WELLBUTRIN SR) 150 MG 12 hr tablet Take 1 tablet (150 mg total) by mouth 2 (two) times daily. 60 tablet 2  . elvitegravir-cobicistat-emtricitabine-tenofovir (STRIBILD) 150-150-200-300 MG TABS tablet Take 1 tablet by mouth daily with breakfast. 30 tablet 11   No current facility-administered medications on file prior to visit.    Past Medical History  Diagnosis Date  . HIV infection   . Depression   . Substance  abuse     Review of Systems  Constitutional: Negative for fever and chills.  Genitourinary: Positive for frequency. Negative for dysuria, discharge, penile swelling, scrotal swelling, penile pain and testicular pain.      Objective:    BP 100/78 mmHg  Pulse 67  Temp(Src) 97.6 F (36.4 C) (Oral)  Resp 18  Ht 5\' 8"  (1.727 m)  Wt 129 lb (58.514 kg)  BMI 19.62 kg/m2  SpO2 97% Nursing note and vital signs reviewed.  Physical Exam  Constitutional: He is oriented to person, place, and time. He appears well-developed and well-nourished. No distress.  HENT:  Right Ear: Hearing, tympanic membrane, external ear and ear canal normal.  Left Ear: Hearing and tympanic membrane normal. There is swelling.  No tragal tenderness bilaterally  Cardiovascular: Normal rate, normal heart sounds and intact distal pulses.  A regularly irregular rhythm present.  Occasional extrasystoles are present.  Pulmonary/Chest: Effort normal and breath sounds normal.  Genitourinary: Right testis shows tenderness. Right testis shows no mass and no swelling. Left testis shows no mass, no swelling and no tenderness.  Neurological: He is alert and oriented to person, place, and time.  Skin: Skin is warm and dry.  Psychiatric: He has a normal mood and affect. His behavior is normal. Judgment and thought content normal.       Assessment & Plan:   Problem List  Items Addressed This Visit      Nervous and Auditory   Otitis externa    Ear pain symptoms and exam consistent with otitis externa. Start cortisporin. Follow up if symptoms worsen or do not improve with treatment.       Relevant Medications   neomycin-polymyxin-hydrocortisone (CORTISPORIN) 3.5-10000-1 otic suspension     Genitourinary   Testicular/scrotal pain - Primary    Testicular exam with tenderness and no obvious masses noted. Start levofloxacin to cover for epididymitis. Obtain scrotal US to rule out underlying pathology. Follow up or seek further  care if symptoms worsen or do not improve.       Relevant Medications   levofloxacin (LEVAQUIN) 500 MG tablet   Other Relevant Orders   US Scrotum     Other   Racing heart beat    In office EKG reveals normal sinus rhythm with occasional PVCs. Asymptomatic currently. Discussed reducing caffeine and stress intake to treat conservatively. If frequency increases or symptoms develop will refer to cardiology for further evaluation.       Relevant Orders   EKG 12-Lead (Completed)   Hyperlipidemia    Requesting refill of pravastatin.       Relevant Medications   pravastatin (PRAVACHOL) 40 MG tablet

## 2015-04-21 NOTE — Assessment & Plan Note (Signed)
Requesting refill of pravastatin.

## 2015-04-21 NOTE — Patient Instructions (Addendum)
Thank you for choosing [facility].  Summary/Instructions:  Your prescription(s) have been submitted to your pharmacy or been printed and provided for you. Please take as directed and contact our office if you believe you are having problem(s) with the medication(s) or have any questions.  Referrals have been made during this visit. You should expect to hear back from our schedulers in about 7-10 days in regards to establishing an appointment with the specialists we discussed.   If your symptoms worsen or fail to improve, please contact our office for further instruction, or in case of emergency go directly to the emergency room at the closest medical facility.      Testicular Self-Exam A self-exam of your testicles is looking at and feeling your testicles for abnormal lumps or swelling. Several things can cause swelling, lumps, or pain in your testicles. Some of these causes are:  Injuries.  Puffiness, redness, and soreness (inflammation).  Infection.  Extra fluids around your testicle.  Twisted testicles.  Testicular cancer. The testicles are easiest to check after warm baths or showers. They are harder to examine when you are cold.  Follow these steps while you are standing:  Hold your penis away from your body.  Roll one testicle between your thumb and finger. Feel the entire testicle.  Roll the other testicle between your thumb and finger. Feel the entire testicle. Feel for lumps, swelling, or discomfort. A normal testicle is egg shaped. It feels firm. It is smooth and not tender. The spermatic cord feels like a firm spaghetti-like cord. It is at the back of your testicle. Examine the crease between the front of your leg and your abdomen. Feel for any bumps that are tender. These could be enlarged lymph nodes.  Document Released: 12/28/2008 Document Revised: 07/22/2013 Document Reviewed: 03/23/2013 [facility] Patient Information 2015 [place] [place]. This information is  not intended to replace advice given to you by your health care provider. Make sure you discuss any questions you have with your health care provider.  Premature Beats A premature beat is an extra heartbeat that happens earlier than normal. Premature beats are called premature atrial contractions (PACs) or premature ventricular contractions (PVCs) depending on the area of the heart where they start. CAUSES  Premature beats may be brought on by a variety of factors including:  Emotional stress.  Lack of sleep.  Caffeine.  Asthma medicines.  Stimulants.  Herbal teas.  Dietary supplements.  Alcohol. In most cases, premature beats are not dangerous and are not a sign of serious heart disease. Most patients evaluated for premature beats have completely normal heart function. Rarely, premature beats may be a sign of more significant heart problems or medical illness. SYMPTOMS  Premature beats may cause palpitations. This means you feel like your heart is skipping a beat or beating harder than usual. Sometimes, slight chest pain occurs with premature beats, lasting only a few seconds. This pain has been described as a "flopping" feeling inside the chest. In many cases, premature beats do not cause any symptoms and they are only detected when an electrocardiography test (EKG) or heart monitoring is performed. DIAGNOSIS  Your caregiver may run some tests to evaluate your heart such as an EKG or echocardiography. You may need to wear a portable heart monitor for several days to record the electrical activity of your heart. Blood testing may also be performed to check your electrolytes and thyroid function. TREATMENT  Premature beats usually go away with rest. If the problem continues, your  caregiver will determine a treatment plan for you.  HOME CARE INSTRUCTIONS  Get plenty of rest over the next few days until your symptoms improve.  Avoid coffee, tea, alcohol, and soda (pop, cola).  Do not  smoke. SEEK MEDICAL CARE IF:  Your symptoms continue after 1 to 2 days of rest.  You have new symptoms, such as chest pain or trouble breathing. SEEK IMMEDIATE MEDICAL CARE IF:  You have severe chest pain or abdominal pain.  You have pain that radiates into the neck, arm, or jaw.  You faint or have extreme weakness.  You have shortness of breath.  Your heartbeat races for more than 5 seconds. MAKE SURE YOU:  Understand these instructions.  Will watch your condition.  Will get help right away if you are not doing well or get worse. Document Released: 11/08/2004 Document Revised: 12/24/2011 Document Reviewed: 06/04/2011 [facility] Patient Information 2015 [place], [place]. This information is not intended to replace advice given to you by your health care provider. Make sure you discuss any questions you have with your health care provider.

## 2015-04-21 NOTE — Assessment & Plan Note (Signed)
Testicular exam with tenderness and no obvious masses noted. Start levofloxacin to cover for epididymitis. Obtain scrotal US to rule out underlying pathology. Follow up or seek further care if symptoms worsen or do not improve.

## 2015-04-25 ENCOUNTER — Other Ambulatory Visit: Payer: Self-pay | Admitting: Family

## 2015-04-25 DIAGNOSIS — IMO0002 Reserved for concepts with insufficient information to code with codable children: Secondary | ICD-10-CM

## 2015-04-26 ENCOUNTER — Ambulatory Visit
Admission: RE | Admit: 2015-04-26 | Discharge: 2015-04-26 | Disposition: A | Discharge status: Home / Self Care | Payer: 59 | Source: Ambulatory Visit | Attending: Family | Admitting: Family

## 2015-04-26 DIAGNOSIS — IMO0002 Reserved for concepts with insufficient information to code with codable children: Secondary | ICD-10-CM

## 2015-04-27 ENCOUNTER — Encounter: Payer: Self-pay | Admitting: Family

## 2015-05-12 ENCOUNTER — Ambulatory Visit: Payer: Self-pay | Admitting: Family Medicine

## 2015-05-26 ENCOUNTER — Encounter: Payer: Self-pay | Admitting: Family Medicine

## 2015-05-26 ENCOUNTER — Ambulatory Visit (INDEPENDENT_AMBULATORY_CARE_PROVIDER_SITE_OTHER): Payer: 59 | Admitting: Family Medicine

## 2015-05-26 VITALS — BP 120/82 | HR 64 | Ht 68.0 in | Wt 127.0 lb

## 2015-05-26 DIAGNOSIS — G2589 Other specified extrapyramidal and movement disorders: Secondary | ICD-10-CM

## 2015-05-26 DIAGNOSIS — M9902 Segmental and somatic dysfunction of thoracic region: Secondary | ICD-10-CM

## 2015-05-26 DIAGNOSIS — M9901 Segmental and somatic dysfunction of cervical region: Secondary | ICD-10-CM | POA: Diagnosis not present

## 2015-05-26 DIAGNOSIS — M999 Biomechanical lesion, unspecified: Secondary | ICD-10-CM

## 2015-05-26 DIAGNOSIS — M9903 Segmental and somatic dysfunction of lumbar region: Secondary | ICD-10-CM | POA: Diagnosis not present

## 2015-05-26 NOTE — Progress Notes (Signed)
[facility] [address] [place], [place] [address] Phone: [phone] Subjective:    I'm seeing this patient by the request  of:  Jeanine Luz, FNP   CC: ` Bilateral shoulder pain right greater than left.  [id number] Frank Webster is a 39 y.o. male coming in with complaint of bilateral shoulder pain right greater than left. Patient states that the right shoulder has been hurting him for quite some time. 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  Patient was treated for more scapular dyskinesia. Patient has not been doing the exercises quite as much but it has been watching his posture on the more regular basis. Patient hasn'tbeen doing the exercises on a regular basis and states that the severe pain is significantly less. Patient denies any numbness or tingling or any new symptoms. His lungs patient watches his posture throughout the day he seems to be doing well. Nothing stopping him from his regular activities and is sleeping comfortably at night.   Past Medical History  Diagnosis Date  . HIV infection   . Depression   . Substance abuse    Past Surgical History  Procedure Laterality Date  . Testicle torsion reduction     Social History  Substance Use Topics  . Smoking status: Current Every Day Smoker -- 0.75 packs/day for 25 years    Types: Cigarettes  . Smokeless tobacco: Never Used     Comment: wants to discuss options today  . 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 Arthritis Neg Hx   . Lupus Neg Hx   . Rosacea Neg Hx   . Cancer Maternal Grandmother        Past medical history, social, surgical and family history all reviewed in electronic medical record.   Review of Systems: No headache, visual changes, nausea, vomiting, diarrhea, constipation, dizziness,  abdominal pain, skin rash, fevers, chills, night sweats, weight loss, swollen lymph nodes, body aches, joint swelling, muscle aches, chest pain, shortness of breath, mood changes.   Objective Blood pressure 120/82, pulse 64, height 5\' 8"  (1.727 m), weight 127 lb (57.607 kg), SpO2 98 %.  General: No apparent distress alert and oriented x3 mood and affect normal, dressed appropriately.  HEENT: Pupils equal, extraocular movements intact  Respiratory: Patient's speak in full sentences and does not appear short of breath  Cardiovascular: No lower extremity edema, non tender, no erythema  Skin: Warm dry intact with no signs of infection or rash on extremities or on axial skeleton.  Abdomen: Soft nontender  Neuro: Cranial nerves II through XII are intact, neurovascularly intact in all extremities with 2+ DTRs and 2+ pulses.  Lymph: No lymphadenopathy of posterior or anterior cervical chain or axillae bilaterally.  Gait normal with good balance and coordination.  MSK:  Non tender with full range of motion and good stability and symmetric strength and tone of shoulders, elbows, wrist, hip, knee and ankles bilaterally.   Neck: Inspection unremarkable. No palpable stepoffs. Negative Spurling's maneuver. 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 ROM is full  in all planes. Rotator cuff strength normal throughout. No signs of impingement with negative Neer and Hawkin's tests, empty can sign. Speeds and Yergason's tests normal. No labral pathology noted with negative Obrien's, negative clunk and good stability. Improvement with continued dysfunction of the scapular region. No painful arc and no drop arm sign. No  apprehension sign   Osteopathic findings C2 flexed rotated and side bent right C7 flexed rotated and side bent left T3 extended rotated and side bent right T7 extended rotated and side bent left L2 flexed rotated and side bent left L4 flexed rotated and side bent right     Impression and Recommendations:     This case required medical decision making of moderate complexity.

## 2015-05-26 NOTE — Assessment & Plan Note (Signed)
Decision today to treat with OMT was based on Physical Exam  After verbal consent patient was treated with HVLA, ME, FPR techniques in the vehicle, thoracic, lumbar areas  Patient tolerated the procedure well with improvement in symptoms  Patient given exercises, stretches and lifestyle modifications  See medications in patient instructions if given  Patient will follow up in 6-8 weeks

## 2015-05-26 NOTE — Assessment & Plan Note (Signed)
Patient overall is doing much better. We discussed continuing the postural changes and trying to do the exercises on a more religious basis. Patient did have some increasing in the osteopathic findings today and we will monitor. Patient has any worsening symptoms she will call but I do think that he is doing well enough that it can come back in 6-8 week intervals.

## 2015-05-26 NOTE — Progress Notes (Signed)
Pre visit review using our clinic review tool, if applicable. No additional management support is needed unless otherwise documented below in the visit note. 

## 2015-05-26 NOTE — Patient Instructions (Signed)
Good to see you You pop well and give your self a pat on the back! I am sorry about your godfather Ice can help and continue to watch the posture Lets space you out to 6-8 weeks.

## 2015-05-30 ENCOUNTER — Telehealth: Payer: Self-pay | Admitting: Family

## 2015-05-30 DIAGNOSIS — H6092 Unspecified otitis externa, left ear: Secondary | ICD-10-CM

## 2015-05-30 MED ORDER — NEOMYCIN-POLYMYXIN-HC 3.5-10000-1 OT SUSP: 4.0000 [drp] | Freq: Three times a day (TID) | OTIC | Status: DC

## 2015-05-30 NOTE — Telephone Encounter (Signed)
Medication refilled

## 2015-05-30 NOTE — Addendum Note (Signed)
Addended by: Jeanine Luz D on: 05/30/2015 12:17 PM   Modules accepted: Orders

## 2015-05-30 NOTE — Telephone Encounter (Signed)
Patient states that his ear infection went away with antiobiotics on 04/21/2015 visit but has come back. He is requesting another round of neomycin-polymyxin-hydrocortisone (CORTISPORIN) 3.5-10000-1 otic suspension [[id number]]

## 2015-06-20 ENCOUNTER — Other Ambulatory Visit: Payer: Self-pay | Admitting: Internal Medicine

## 2015-06-20 DIAGNOSIS — Z720 Tobacco use: Secondary | ICD-10-CM

## 2015-07-07 ENCOUNTER — Ambulatory Visit: Payer: 59 | Admitting: Family Medicine

## 2015-07-18 ENCOUNTER — Encounter: Payer: Self-pay | Admitting: Family Medicine

## 2015-07-18 ENCOUNTER — Ambulatory Visit (INDEPENDENT_AMBULATORY_CARE_PROVIDER_SITE_OTHER): Payer: 59 | Admitting: Family Medicine

## 2015-07-18 VITALS — BP 118/70 | HR 73 | Ht 68.0 in | Wt 130.0 lb

## 2015-07-18 DIAGNOSIS — M999 Biomechanical lesion, unspecified: Secondary | ICD-10-CM

## 2015-07-18 DIAGNOSIS — S4351XD Sprain of right acromioclavicular joint, subsequent encounter: Secondary | ICD-10-CM | POA: Diagnosis not present

## 2015-07-18 DIAGNOSIS — M9901 Segmental and somatic dysfunction of cervical region: Secondary | ICD-10-CM | POA: Diagnosis not present

## 2015-07-18 DIAGNOSIS — M9903 Segmental and somatic dysfunction of lumbar region: Secondary | ICD-10-CM

## 2015-07-18 DIAGNOSIS — M9902 Segmental and somatic dysfunction of thoracic region: Secondary | ICD-10-CM

## 2015-07-18 NOTE — Assessment & Plan Note (Signed)
Mild osteophytic changes of this joint. We discussed icing regimen and home exercises. Patient will continue to monitor and if any worsening symptoms we can consider injection.

## 2015-07-18 NOTE — Progress Notes (Signed)
Pre visit review using our clinic review tool, if applicable. No additional management support is needed unless otherwise documented below in the visit note. 

## 2015-07-18 NOTE — Patient Instructions (Addendum)
Good to see you You are doing great overall keep it up  The posture and the strengthening of the upper back is key With studying keep monitor at eye level.  Keep back in chair (tennis ball between shoulder blades) Get back on the wall Glucosamine  daily Keep hands within periphearal while at gym Sleep with right arm down if you can.  Try pennsaid when you need it Ice can help Valerian root at night  Turmeric  twice RTC in 4-6 weeks.

## 2015-07-18 NOTE — Assessment & Plan Note (Signed)
Decision today to treat with OMT was based on Physical Exam  After verbal consent patient was treated with HVLA, ME, FPR techniques in the vehicle, thoracic, lumbar areas  Patient tolerated the procedure well with improvement in symptoms  Patient given exercises, stretches and lifestyle modifications, with patient standing more we discussed ergonomics at his does. We discussed icing regimen. We also discussed proper posture as well as postural training techniques.  See medications in patient instructions if given  Patient will follow up in 6-8 weeks

## 2015-07-18 NOTE — Progress Notes (Signed)
[facility] [address] [place], [place] [address] Phone: [phone] Subjective:    I'm seeing this patient by the request  of:  Jeanine Luz, FNP   CC: ` Bilateral shoulder pain right greater than left.  [id number] Frank Webster is a 39 y.o. male coming in with complaint of bilateral shoulder pain right greater than left. Patient does have some mild osteophytic changes. Patient has noticed some more pain seems to be on the right side. Patient has had difficulty sleeping. Denies any radiation down the arm. States that overhead activity seems to be more difficult. Patient stopped his job and is in school now full time.  Past Medical History  Diagnosis Date  . HIV infection (HCC)   . Depression   . Substance abuse    Past Surgical History  Procedure Laterality Date  . Testicle torsion reduction     Social History  Substance Use Topics  . Smoking status: Current Every Day Smoker -- 0.75 packs/day for 25 years    Types: Cigarettes  . Smokeless tobacco: Never Used     Comment: wants to discuss options today  . 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 Arthritis Neg Hx   . Lupus Neg Hx   . Rosacea Neg Hx   . Cancer Maternal Grandmother        Past medical history, social, surgical and family history all reviewed in electronic medical record.   Review of Systems: No headache, visual changes, nausea, vomiting, diarrhea, constipation, dizziness, abdominal pain, skin rash, fevers, chills, night sweats, weight loss, swollen lymph nodes, body aches, joint swelling, muscle aches, chest pain, shortness of breath, mood changes.   Objective Blood pressure 118/70, pulse 73, height  (1.727 m), weight 130 lb (58.968 kg), SpO2 98 %.  General: No apparent distress alert and oriented x3 mood and affect normal, dressed appropriately.  HEENT: Pupils equal, extraocular  movements intact  Respiratory: Patient's speak in full sentences and does not appear short of breath  Cardiovascular: No lower extremity edema, non tender, no erythema  Skin: Warm dry intact with no signs of infection or rash on extremities or on axial skeleton.  Abdomen: Soft nontender  Neuro: Cranial nerves II through XII are intact, neurovascularly intact in all extremities with 2+ DTRs and 2+ pulses.  Lymph: No lymphadenopathy of posterior or anterior cervical chain or axillae bilaterally.  Gait normal with good balance and coordination.  MSK:  Non tender with full range of motion and good stability and symmetric strength and tone of shoulders, elbows, wrist, hip, knee and ankles bilaterally.   Neck: Inspection unremarkable. No palpable stepoffs. Negative Spurling's maneuver. 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 ROM is full in all planes. Rotator cuff strength normal throughout. No signs of impingement with negative Neer and Hawkin's tests, empty can sign. Speeds and Yergason's tests normal. No labral pathology noted with negative Obrien's, negative clunk and good stability. Improvement with continued dysfunction of the scapular region. No painful arc and no drop arm sign. No apprehension sign   Osteopathic findings C2 flexed rotated and side bent right C7 flexed  rotated and side bent left T3 extended rotated and side bent right inhaled third rib on right side T7 extended rotated and side bent left L2 flexed rotated and side bent left L4 flexed rotated and side bent right     Impression and Recommendations:     This case required medical decision making of moderate  complexity.

## 2015-08-11 ENCOUNTER — Encounter: Payer: Self-pay | Admitting: Family

## 2015-08-11 ENCOUNTER — Ambulatory Visit (INDEPENDENT_AMBULATORY_CARE_PROVIDER_SITE_OTHER): Payer: 59 | Admitting: Family

## 2015-08-11 VITALS — BP 110/60 | HR 77 | Temp 98.1°F | Ht 68.0 in | Wt 133.5 lb

## 2015-08-11 DIAGNOSIS — T148 Other injury of unspecified body region: Secondary | ICD-10-CM

## 2015-08-11 DIAGNOSIS — W57XXXA Bitten or stung by nonvenomous insect and other nonvenomous arthropods, initial encounter: Secondary | ICD-10-CM | POA: Diagnosis not present

## 2015-08-11 MED ORDER — METHYLPREDNISOLONE ACETATE 80 MG/ML IJ SUSP
80.0000 mg | Freq: Once | INTRAMUSCULAR | Status: AC
  Administered 2015-08-11: 80 mg via INTRAMUSCULAR

## 2015-08-11 MED ORDER — DOXYCYCLINE HYCLATE 100 MG PO TABS: 100.0000 mg | ORAL_TABLET | Freq: Two times a day (BID) | ORAL | Status: DC

## 2015-08-11 MED ORDER — TRIAMCINOLONE ACETONIDE 0.1 % EX CREA: 1.0000 "application " | TOPICAL_CREAM | Freq: Two times a day (BID) | CUTANEOUS | Status: DC

## 2015-08-11 NOTE — Patient Instructions (Signed)
Thank you for choosing [facility].  Summary/Instructions:  Your prescription(s) have been submitted to your pharmacy or been printed and provided for you. Please take as directed and contact our office if you believe you are having problem(s) with the medication(s) or have any questions.  If your symptoms worsen or fail to improve, please contact our office for further instruction, or in case of emergency go directly to the emergency room at the closest medical facility.   Insect Bite Mosquitoes, flies, fleas, bedbugs, and many other insects can bite. Insect bites are different from insect stings. A sting is when poison (venom) is injected into the skin. Insect bites can cause pain or itching for a few days, but they are usually not serious. Some insects can spread diseases to people through a bite. SYMPTOMS  Symptoms of an insect bite include:  Itching or pain in the bite area.  Redness and swelling in the bite area.  An open wound (skin ulcer). In many cases, symptoms last for 2-4 days.  DIAGNOSIS  This condition is usually diagnosed based on symptoms and a physical exam. TREATMENT  Treatment is usually not needed for an insect bite. Symptoms often go away on their own. Your health care provider may recommend creams or lotions to help reduce itching. Antibiotic medicines may be prescribed if the bite becomes infected. A tetanus shot may be given in some cases. If you develop an allergic reaction to an insect bite, your health care provider will prescribe medicines to treat the reaction (antihistamines). This is rare. HOME CARE INSTRUCTIONS  Do not scratch the bite area.  Keep the bite area clean and dry. Wash the bite area daily with soap and water as told by your health care provider.  If directed, applyice to the bite area.  Put ice in a plastic bag.  Place a towel between your skin and the bag.  Leave the ice on for 20 minutes, 2-3 times per day.  To help reduce  itching and swelling, try applying a baking soda paste, cortisone cream, or calamine lotion to the bite area as told by your health care provider.  Apply or take over-the-counter and prescription medicines only as told by your health care provider.  If you were prescribed an antibiotic medicine, use it as told by your health care provider. Do not stop using the antibiotic even if your condition improves.  Keep all follow-up visits as told by your health care provider. This is important. PREVENTION   Use insect repellent. The best insect repellents contain:  DEET, picaridin, oil of lemon eucalyptus (OLE), or IR3535.  Higher amounts of an active ingredient.  When you are outdoors, wear clothing that covers your arms and legs.  Avoid opening windows that do not have window screens. SEEK MEDICAL CARE IF:  You have increased redness, swelling, or pain in the bite area.  You have a fever. SEEK IMMEDIATE MEDICAL CARE IF:   You have joint pain.   You have fluid, blood, or pus coming from the bite area.  You have a headache or neck pain.  You have unusual weakness.  You have a rash.  You have chest pain or shortness of breath.  You have abdominal pain, nausea, or vomiting.  You feel unusually tired or sleepy.   This information is not intended to replace advice given to you by your health care provider. Make sure you discuss any questions you have with your health care provider.   Document Released: 11/08/2004 Document  Revised: 06/22/2015 Document Reviewed: 02/16/2015 Elsevier Interactive Patient Education [employer].

## 2015-08-11 NOTE — Progress Notes (Signed)
   Subjective:    Patient ID: Frank Webster, male    DOB: [birth year], [age] y.o.   MRN: [id number]  Chief Complaint  Patient presents with  . Bite    left forearm. redness that is spreading (bite happened 2 days ago). hard nodule in center. hot to touch. itchy and swollen.     HPI:  Frank Webster is a 39 y.o. male who  has a past medical history of HIV infection (HCC); Depression; and Substance abuse. and presents today for an acute office visit.   Associated symptom of a rash/bite located on his left forearm has been going on for about 2 days. Has spread since initial onset and is described as itchy and swollen. Modifying factors include Benedryl which has helped a little. Was advised by the pharmacy to place a circle around the site and has spread since that time.   No Known Allergies   Current Outpatient Prescriptions on File Prior to Visit  Medication Sig Dispense Refill  . elvitegravir-cobicistat-emtricitabine-tenofovir (STRIBILD) 150-150-200-300 MG TABS tablet Take 1 tablet by mouth daily with breakfast. 30 tablet 11  . pravastatin (PRAVACHOL) 40 MG tablet Take 1 tablet (40 mg total) by mouth daily. 90 tablet 1   No current facility-administered medications on file prior to visit.     Past Surgical History  Procedure Laterality Date  . Testicle torsion reduction          Review of Systems  Constitutional: Negative for fever and chills.  Skin: Positive for rash.      Objective:    BP 110/60 mmHg  Pulse 77  Temp(Src) 98.1 F (36.7 C) (Oral)  Ht 5\' 8"  (1.727 m)  Wt 133 lb 8 oz (60.555 kg)  BMI 20.30 kg/m2  SpO2 97% Nursing note and vital signs reviewed.  Physical Exam  Constitutional: He is oriented to person, place, and time. He appears well-developed and well-nourished. No distress.  Cardiovascular: Normal rate, regular rhythm, normal heart sounds and intact distal pulses.   Pulmonary/Chest: Effort normal and breath sounds normal.  Neurological: He is  alert and oriented to person, place, and time.  Skin: Skin is warm and dry.  4-5 cm raised, reddened area with a small site that appears consistent with sting or bite located at the center with radiating redness that is slowly spreading on the anterior aspect of the left forearm. No discharge.   Psychiatric: He has a normal mood and affect. His behavior is normal. Judgment and thought content normal.       Assessment & Plan:   Problem List Items Addressed This Visit      Other   Insect bite - Primary    Reaction to unknown insect.  In office depomedrol injection provided. Start trimacinalone cream. Start doxycycline to cover. Follow up if symptoms worsen or fail to improve.       Relevant Medications   methylPREDNISolone acetate (DEPO-MEDROL) injection 80 mg (Completed)

## 2015-08-11 NOTE — Assessment & Plan Note (Signed)
Reaction to unknown insect.  In office depomedrol injection provided. Start trimacinalone cream. Start doxycycline to cover. Follow up if symptoms worsen or fail to improve.

## 2015-08-15 ENCOUNTER — Ambulatory Visit (INDEPENDENT_AMBULATORY_CARE_PROVIDER_SITE_OTHER): Payer: 59 | Admitting: Family Medicine

## 2015-08-15 ENCOUNTER — Encounter: Payer: Self-pay | Admitting: Family Medicine

## 2015-08-15 VITALS — BP 128/70 | HR 77 | Ht 68.0 in | Wt 131.0 lb

## 2015-08-15 DIAGNOSIS — M9902 Segmental and somatic dysfunction of thoracic region: Secondary | ICD-10-CM | POA: Diagnosis not present

## 2015-08-15 DIAGNOSIS — M999 Biomechanical lesion, unspecified: Secondary | ICD-10-CM

## 2015-08-15 DIAGNOSIS — M9903 Segmental and somatic dysfunction of lumbar region: Secondary | ICD-10-CM

## 2015-08-15 DIAGNOSIS — M9901 Segmental and somatic dysfunction of cervical region: Secondary | ICD-10-CM

## 2015-08-15 DIAGNOSIS — G2589 Other specified extrapyramidal and movement disorders: Secondary | ICD-10-CM | POA: Diagnosis not present

## 2015-08-15 NOTE — Progress Notes (Signed)
Frank Webster D.O. [facility] [address] [place], [place] [address] Phone: [phone] Subjective:    I'm seeing this patient by the request  of:  Jeanine Luzalone, Gregory, FNP   CC: ` Bilateral shoulder pain right greater than left.  [id number]:Subjective Frank BatmanGuillermo Webster is a 39 y.o. male coming in with complaint of bilateral shoulder pain right greater than left. Patient overall has been doing relatively well. Patient states some mild increasing and back pain. Patient denies though any radiation. Has not been doing exercises quite as regularly.patient denies any new symptoms. Nothing that is stopping him from activities. States that he is waking up and feeling well rested.  Past Medical History  Diagnosis Date  . HIV infection (HCC)   . Depression   . Substance abuse    Past Surgical History  Procedure Laterality Date  . Testicle torsion reduction     Social History  Substance Use Topics  . Smoking status: Current Every Day Smoker -- 0.75 packs/day for 25 years    Types: Cigarettes  . Smokeless tobacco: Never Used     Comment: wants to discuss options today  . 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 Arthritis Neg Hx   . Lupus Neg Hx   . Rosacea Neg Hx   . Cancer Maternal Grandmother        Past medical history, social, surgical and family history all reviewed in electronic medical record.   Review of Systems: No headache, visual changes, nausea, vomiting, diarrhea, constipation, dizziness, abdominal pain, skin rash, fevers, chills, night sweats, weight loss, swollen lymph nodes, body aches, joint swelling, muscle aches, chest pain, shortness of breath, mood changes.   Objective Blood pressure 128/70, pulse 77, height 5\' 8"  (1.727 m), weight 131 lb (59.421 kg), SpO2 98 %.  General: No apparent distress alert and oriented x3 mood and affect normal, dressed appropriately.  HEENT: Pupils equal,  extraocular movements intact  Respiratory: Patient's speak in full sentences and does not appear short of breath  Cardiovascular: No lower extremity edema, non tender, no erythema  Skin: Warm dry intact with no signs of infection or rash on extremities or on axial skeleton.  Abdomen: Soft nontender  Neuro: Cranial nerves II through XII are intact, neurovascularly intact in all extremities with 2+ DTRs and 2+ pulses.  Lymph: No lymphadenopathy of posterior or anterior cervical chain or axillae bilaterally.  Gait normal with good balance and coordination.  MSK:  Non tender with full range of motion and good stability and symmetric strength and tone of shoulders, elbows, wrist, hip, knee and ankles bilaterally.   Neck: Inspection unremarkable. No palpable stepoffs. Negative Spurling's maneuver. 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 ROM is full in all planes. Rotator cuff strength normal throughout. No signs of impingement with negative Neer and Hawkin's tests, empty can sign. Speeds and Yergason's tests normal. No labral pathology noted with negative Obrien's, negative clunk and good stability. Continued dysfunction of the scapular region Positive crossover of the acromial clavicular joint No apprehension sign   Osteopathic findings C2 flexed rotated and side bent right C7 flexed rotated and side bent left  T3 extended rotated and side bent right inhaled third rib  T7 extended rotated and side bent left L2 flexed rotated and side bent left      Impression and Recommendations:     This case required medical decision making of moderate complexity.

## 2015-08-15 NOTE — Assessment & Plan Note (Signed)
Decision today to treat with OMT was based on Physical Exam  After verbal consent patient was treated with HVLA, ME, FPR techniques in the vehicle, thoracic, lumbar areas  Patient tolerated the procedure well with improvement in symptoms  Patient given exercises, stretches and lifestyle modifications, with patient standing more we discussed ergonomics at his does. We discussed icing regimen. We also discussed proper posture as well as postural training techniques.  See medications in patient instructions if given  Patient will follow up in 6 weeks

## 2015-08-15 NOTE — Patient Instructions (Addendum)
Great to see you Excellent!!! Keep working on the posture The exercises and vitamins are great See me again in 5-6 weeks

## 2015-08-15 NOTE — Assessment & Plan Note (Signed)
Discussed with patient the importance of doing the home exercises and strengthening up her back. This will relieve a significant amount of symptoms. We discussed icing. We discussed home exercises. Patient and will come back and see me again in 4-6 weeks.

## 2015-08-15 NOTE — Progress Notes (Signed)
Pre visit review using our clinic review tool, if applicable. No additional management support is needed unless otherwise documented below in the visit note. 

## 2015-09-01 ENCOUNTER — Encounter: Payer: Self-pay | Admitting: Family

## 2015-09-01 ENCOUNTER — Ambulatory Visit (INDEPENDENT_AMBULATORY_CARE_PROVIDER_SITE_OTHER): Payer: 59 | Admitting: Family

## 2015-09-01 VITALS — BP 120/84 | HR 79 | Temp 97.8°F | Resp 18 | Ht 68.0 in | Wt 130.0 lb

## 2015-09-01 DIAGNOSIS — F331 Major depressive disorder, recurrent, moderate: Secondary | ICD-10-CM | POA: Diagnosis not present

## 2015-09-01 MED ORDER — CITALOPRAM HYDROBROMIDE 40 MG PO TABS: ORAL_TABLET | ORAL | Status: DC

## 2015-09-01 NOTE — Progress Notes (Signed)
Subjective:    Patient ID: Frank Webster, male    DOB: Mar 03, 1976, 39 y.o.   MRN: [id number]  Chief Complaint  Patient presents with  . Depression    has tried celexa in the past and does remember it working    HPI:  Frank Webster is a 39 y.o. male who  has a past medical history of HIV infection (HCC); Depression; and Substance abuse. and presents today for a follow up office visit.   Associated symptom of feeling depressed has been going on for several weeks and has been seeing his counselor who believes he is in the middle of a major depressive episode. Describes feeling of tearfulness, decreased interest and pleasure, self-hate, and fatigue. 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 Previously maintained on celexa and not taking any medications currently. Denies any suicidal ideations. Does have previous history of psychological trauma.    No Known Allergies   Current Outpatient Prescriptions on File Prior to Visit  Medication Sig Dispense Refill  . cholecalciferol (VITAMIN D) 1000 UNITS tablet Take 1,000 Units by mouth daily.    Marland Kitchen. elvitegravir-cobicistat-emtricitabine-tenofovir (STRIBILD) 150-150-200-300 MG TABS tablet Take 1 tablet by mouth daily with breakfast. 30 tablet 11  . glucosamine-chondroitin 500-400 MG tablet Take 1 tablet by mouth 3 (three) times daily.    . Melatonin 1 MG TABS Take by mouth.    . NON FORMULARY Tumeric    . NON FORMULARY L-Theanine    . Omega-3 Fatty Acids (FISH OIL PO) Take by mouth.    . vitamin C (ASCORBIC ACID) 250 MG tablet Take 250 mg by mouth daily.     No current facility-administered medications on file prior to visit.    Review of Systems  Constitutional: Positive for fatigue.  Psychiatric/Behavioral: Positive for dysphoric mood and decreased concentration. Negative for suicidal ideas and self-injury. The patient is not nervous/anxious.       Objective:    BP 120/84 mmHg  Pulse 79  Temp(Src)  97.8 F (36.6 C) (Oral)  Resp 18  Ht 5\' 8"  (1.727 m)  Wt 130 lb (58.968 kg)  BMI 19.77 kg/m2  SpO2 97% Nursing note and vital signs reviewed.  Depression screen PHQ 2/9 09/01/2015  Decreased Interest 2  Down, Depressed, Hopeless 2  PHQ - 2 Score 4  Altered sleeping 3  Tired, decreased energy 3  Change in appetite 3  Feeling bad or failure about yourself  2  Trouble concentrating 2  Moving slowly or fidgety/restless 2  Suicidal thoughts 0  PHQ-9 Score 19  Difficult doing work/chores Somewhat difficult    Physical Exam  Constitutional: He is oriented to person, place, and time. He appears well-developed and well-nourished. No distress.  Cardiovascular: Normal rate, regular rhythm, normal heart sounds and intact distal pulses.   Pulmonary/Chest: Effort normal and breath sounds normal.  Neurological: He is alert and oriented to person, place, and time.  Skin: Skin is warm and dry.  Psychiatric: He has a normal mood and affect. His behavior is normal. Judgment and thought content normal.       Assessment & Plan:   Problem List Items Addressed This Visit      Other   Depression, major (HCC) - Primary    Symptoms and exam consistent with moderate and severe depression based on PHQ9 score of 19. Denies suicidal ideations. Discussed risks and benefits of medications. Start Celexa. Continue therapy with counseling. Follow-up in one month or sooner if needed.  Relevant Medications   citalopram (CELEXA) 40 MG tablet

## 2015-09-01 NOTE — Patient Instructions (Signed)
Thank you for choosing [facility].  Summary/Instructions:  Your prescription(s) have been submitted to your pharmacy or been printed and provided for you. Please take as directed and contact our office if you believe you are having problem(s) with the medication(s) or have any questions.  If your symptoms worsen or fail to improve, please contact our office for further instruction, or in case of emergency go directly to the emergency room at the closest medical facility.    Major Depressive Disorder Major depressive disorder is a mental illness. It also may be called clinical depression or unipolar depression. Major depressive disorder usually causes feelings of sadness, hopelessness, or helplessness. Some people with this disorder do not feel particularly sad but lose interest in doing things they used to enjoy (anhedonia). Major depressive disorder also can cause physical symptoms. It can interfere with work, school, relationships, and other normal everyday activities. The disorder varies in severity but is longer lasting and more serious than the sadness we all feel from time to time in our lives. Major depressive disorder often is triggered by stressful life events or major life changes. Examples of these triggers include divorce, loss of your job or home, a move, and the death of a family member or close friend. Sometimes this disorder occurs for no obvious reason at all. People who have family members with major depressive disorder or bipolar disorder are at higher risk for developing this disorder, with or without life stressors. Major depressive disorder can occur at any age. It may occur just once in your life (single episode major depressive disorder). It may occur multiple times (recurrent major depressive disorder). SYMPTOMS People with major depressive disorder have either anhedonia or depressed mood on nearly a daily basis for at least 2 weeks or longer. Symptoms of depressed mood  include:  Feelings of sadness (blue or down in the dumps) or emptiness.  Feelings of hopelessness or helplessness.  Tearfulness or episodes of crying (may be observed by others).  Irritability (children and adolescents). In addition to depressed mood or anhedonia or both, people with this disorder have at least four of the following symptoms:  Difficulty sleeping or sleeping too much.   Significant change (increase or decrease) in appetite or weight.   Lack of energy or motivation.  Feelings of guilt and worthlessness.   Difficulty concentrating, remembering, or making decisions.  Unusually slow movement (psychomotor retardation) or restlessness (as observed by others).   Recurrent wishes for death, recurrent thoughts of self-harm (suicide), or a suicide attempt. People with major depressive disorder commonly have persistent negative thoughts about themselves, other people, and the world. People with severe major depressive disorder may experiencedistorted beliefs or perceptions about the world (psychotic delusions). They also may see or hear things that are not real (psychotic hallucinations). DIAGNOSIS Major depressive disorder is diagnosed through an assessment by your health care provider. Your health care provider will ask aboutaspects of your daily life, such as mood,sleep, and appetite, to see if you have the diagnostic symptoms of major depressive disorder. Your health care provider may ask about your medical history and use of alcohol or drugs, including prescription medicines. Your health care provider also may do a physical exam and blood work. This is because certain medical conditions and the use of certain substances can cause major depressive disorder-like symptoms (secondary depression). Your health care provider also may refer you to a mental health specialist for further evaluation and treatment. TREATMENT It is important to recognize the symptoms of major    depressive disorder and seek treatment. The following treatments can be prescribed for this disorder:   Medicine. Antidepressant medicines usually are prescribed. Antidepressant medicines are thought to correct chemical imbalances in the brain that are commonly associated with major depressive disorder. Other types of medicine may be added if the symptoms do not respond to antidepressant medicines alone or if psychotic delusions or hallucinations occur.  Talk therapy. Talk therapy can be helpful in treating major depressive disorder by providing support, education, and guidance. Certain types of talk therapy also can help with negative thinking (cognitive behavioral therapy) and with relationship issues that trigger this disorder (interpersonal therapy). A mental health specialist can help determine which treatment is best for you. Most people with major depressive disorder do well with a combination of medicine and talk therapy. Treatments involving electrical stimulation of the brain can be used in situations with extremely severe symptoms or when medicine and talk therapy do not work over time. These treatments include electroconvulsive therapy, transcranial magnetic stimulation, and vagal nerve stimulation.   This information is not intended to replace advice given to you by your health care provider. Make sure you discuss any questions you have with your health care provider.   Document Released: 01/26/2013 Document Revised: 10/22/2014 Document Reviewed: 01/26/2013 Elsevier Interactive Patient Education 2016 Elsevier Inc.  

## 2015-09-01 NOTE — Assessment & Plan Note (Signed)
Symptoms and exam consistent with moderate and severe depression based on PHQ9 score of 19. Denies suicidal ideations. Discussed risks and benefits of medications. Start Celexa. Continue therapy with counseling. Follow-up in one month or sooner if needed.

## 2015-09-01 NOTE — Progress Notes (Signed)
Pre visit review using our clinic review tool, if applicable. No additional management support is needed unless otherwise documented below in the visit note. 

## 2015-09-14 ENCOUNTER — Telehealth: Payer: Self-pay | Admitting: Family

## 2015-09-14 MED ORDER — FLUOXETINE HCL 20 MG PO TABS: 20.0000 mg | ORAL_TABLET | Freq: Every day | ORAL | Status: DC

## 2015-09-14 NOTE — Telephone Encounter (Signed)
Pt called stated Tammy SoursGreg gave him medication for antidepressant and he thinks he might experiencing some side effect from this med : extreme exhaustion, difficult concentration, loss aplitite, body ache, eyes irritation. Please call pt back

## 2015-09-14 NOTE — Telephone Encounter (Signed)
Please have him discontinue the celexa and start the prozac that was sent to pharmacy.

## 2015-09-14 NOTE — Telephone Encounter (Signed)
Please advise on another option for pt. Do you want to see pt?

## 2015-09-15 NOTE — Telephone Encounter (Signed)
Pt aware.

## 2015-09-19 ENCOUNTER — Telehealth: Payer: Self-pay | Admitting: Family

## 2015-09-19 MED ORDER — VILAZODONE HCL 10 MG PO TABS: ORAL_TABLET | ORAL | Status: DC

## 2015-09-19 NOTE — Telephone Encounter (Signed)
Please have him discontinue taking the medication. I will send in a prescription for Viibryd for him to try.

## 2015-09-19 NOTE — Telephone Encounter (Signed)
Please advise 

## 2015-09-19 NOTE — Telephone Encounter (Signed)
Patient called regarding FLUoxetine (PROZAC) 20 MG tablet [[id number]][[id number]]   He states that he has been taking it as directed, and it has created a rash, along with dry mouth and insomnia. He is asking if he should continue to take this medication or if he should come in for a visit.

## 2015-09-20 NOTE — Telephone Encounter (Signed)
Notified pt with Greg response.../lmb 

## 2015-09-26 ENCOUNTER — Encounter: Payer: Self-pay | Admitting: Family Medicine

## 2015-09-26 ENCOUNTER — Other Ambulatory Visit: Payer: 59

## 2015-09-26 ENCOUNTER — Ambulatory Visit (INDEPENDENT_AMBULATORY_CARE_PROVIDER_SITE_OTHER): Payer: 59 | Admitting: Family Medicine

## 2015-09-26 VITALS — BP 118/60 | HR 94 | Ht 68.0 in | Wt 134.0 lb

## 2015-09-26 DIAGNOSIS — M9903 Segmental and somatic dysfunction of lumbar region: Secondary | ICD-10-CM

## 2015-09-26 DIAGNOSIS — M9902 Segmental and somatic dysfunction of thoracic region: Secondary | ICD-10-CM

## 2015-09-26 DIAGNOSIS — F332 Major depressive disorder, recurrent severe without psychotic features: Secondary | ICD-10-CM | POA: Diagnosis not present

## 2015-09-26 DIAGNOSIS — M9901 Segmental and somatic dysfunction of cervical region: Secondary | ICD-10-CM

## 2015-09-26 DIAGNOSIS — M999 Biomechanical lesion, unspecified: Secondary | ICD-10-CM

## 2015-09-26 DIAGNOSIS — B20 Human immunodeficiency virus [HIV] disease: Secondary | ICD-10-CM

## 2015-09-26 DIAGNOSIS — G2589 Other specified extrapyramidal and movement disorders: Secondary | ICD-10-CM | POA: Diagnosis not present

## 2015-09-26 NOTE — Patient Instructions (Addendum)
Great to see you Frank Webster when you need it Keep doing what you are doing.  Happy holidays!  See me again in 4-6 weeks.

## 2015-09-26 NOTE — Progress Notes (Signed)
Tawana ScaleZach Smith D.O.  Sports Medicine [address] [place], [place] [address] Phone: [phone] Subjective:     CC: ` Upper back pain  BJY:NWGNFAOZHYHPI:Subjective Frank BatmanGuillermo Webster is a 39 y.o. male coming in with complaint of bilateral shoulder pain right greater than left. Patient overall has been doing relatively well. Patient did have a major depressive episode. Patient states that he stopped doing the exercises. This is causing some exacerbation of the neck pain in the upper arm pains. Nothing that is stopping him from activities. Patient states that it is very difficult. Patient states that more of a throbbing sensation. Looking forward to starting the exercises again soon.   Past Medical History  Diagnosis Date  . HIV infection (HCC)   . Depression   . Substance abuse    Past Surgical History  Procedure Laterality Date  . Testicle torsion reduction     Social History  Substance Use Topics  . Smoking status: Current Every Day Smoker -- 0.75 packs/day for 25 years    Types: Cigarettes  . Smokeless tobacco: Never Used     Comment: wants to discuss options today  . 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 Arthritis Neg Hx   . Lupus Neg Hx   . Rosacea Neg Hx   . Cancer Maternal Grandmother        Past medical history, social, surgical and family history all reviewed in electronic medical record.   Review of Systems: No headache, visual changes, nausea, vomiting, diarrhea, constipation, dizziness, abdominal pain, skin rash, fevers, chills, night sweats, weight loss, swollen lymph nodes, body aches, joint swelling, muscle aches, chest pain, shortness of breath, mood changes.   Objective Blood pressure 118/60, pulse 94, height 5\' 8"  (1.727 m), weight 134 lb (60.782 kg), SpO2 97 %.  General: No apparent distress alert and oriented x3 mood and affect normal, dressed appropriately.  HEENT: Pupils equal,  extraocular movements intact  Respiratory: Patient's speak in full sentences and does not appear short of breath  Cardiovascular: No lower extremity edema, non tender, no erythema  Skin: Warm dry intact with no signs of infection or rash on extremities or on axial skeleton.  Abdomen: Soft nontender  Neuro: Cranial nerves II through XII are intact, neurovascularly intact in all extremities with 2+ DTRs and 2+ pulses.  Lymph: No lymphadenopathy of posterior or anterior cervical chain or axillae bilaterally.  Gait normal with good balance and coordination.  MSK:  Non tender with full range of motion and good stability and symmetric strength and tone of shoulders, elbows, wrist, hip, knee and ankles bilaterally.   Neck: Inspection unremarkable. No palpable stepoffs. Negative Spurling's maneuver. Full neck range of motion Grip strength and sensation normal in bilateral hands Strength good C4 to T1 distribution No sensory change to C4 to T1 Negative Hoffman sign bilaterally Reflexes normal  Shoulder: bilateral Mild tenderness still over the acromial clavicular joint bilaterally right greater than left. ROM is full in all planes. Rotator cuff strength normal throughout. No signs of impingement with negative Neer and Hawkin's tests, empty can sign. Speeds and Yergason's tests normal. No labral pathology noted with negative Obrien's, negative clunk and good stability. Possible worsening of the scapular dysfunction Positive crossover of the acromial clavicular joint No apprehension sign   Osteopathic findings C2 flexed rotated and side bent right C7 flexed rotated and side bent left T3 extended rotated and  side bent right inhaled third rib  T7 extended rotated and side bent left L2 flexed rotated and side bent left Sacrum left on left      Impression and Recommendations:     This case required medical decision making of moderate complexity.

## 2015-09-26 NOTE — Assessment & Plan Note (Signed)
Encouraged patient and he is going to need to work on his ergonomics. We discussed again about proper lifting positions. We discussed making sure the patient with studying is doing more of an ergonomic approach. We discussed postural changes. We discussed icing and the topical anti-inflammatories again. Patient will continue with the vitamin supplementations. We will see him back again in 3-4 weeks for further evaluation and treatment. Continues to respond well to osteopathic manipulation.

## 2015-09-26 NOTE — Assessment & Plan Note (Signed)
Patient is following up with another provider. No suicidal or homicidal ideation. Feel safe at home. Good support system.

## 2015-09-26 NOTE — Assessment & Plan Note (Signed)
Decision today to treat with OMT was based on Physical Exam  After verbal consent patient was treated with HVLA, ME, FPR techniques in the vehicle, thoracic, lumbar areas  Patient tolerated the procedure well with improvement in symptoms  Patient given exercises, stretches and lifestyle modifications, with patient standing more we discussed ergonomics at his does. We discussed icing regimen. We also discussed proper posture as well as postural training techniques.  See medications in patient instructions if given  Patient will follow up in 3-6 weeks

## 2015-09-26 NOTE — Progress Notes (Signed)
Pre visit review using our clinic review tool, if applicable. No additional management support is needed unless otherwise documented below in the visit note. 

## 2015-09-27 LAB — T-HELPER CELL (CD4) - (RCID CLINIC ONLY)
CD4 T CELL HELPER: 38 % (ref 33–55)
CD4 T Cell Abs: 800 /uL (ref 400–2700)

## 2015-09-27 LAB — HIV-1 RNA QUANT-NO REFLEX-BLD
HIV 1 RNA Quant: 42 copies/mL — ABNORMAL HIGH (ref <20)
HIV-1 RNA Quant, Log: 1.62 Log copies/mL — ABNORMAL HIGH (ref <1.30)

## 2015-09-28 ENCOUNTER — Other Ambulatory Visit: Payer: Self-pay | Admitting: *Deleted

## 2015-09-28 DIAGNOSIS — B20 Human immunodeficiency virus [HIV] disease: Secondary | ICD-10-CM

## 2015-09-28 MED ORDER — ELVITEG-COBIC-EMTRICIT-TENOFDF 150-150-200-300 MG PO TABS: 1.0000 | ORAL_TABLET | Freq: Every day | ORAL | Status: DC

## 2015-09-30 ENCOUNTER — Ambulatory Visit: Payer: 59 | Admitting: Family

## 2015-10-07 ENCOUNTER — Telehealth: Payer: Self-pay | Admitting: *Deleted

## 2015-10-07 NOTE — Telephone Encounter (Signed)
Pt left msg on triage stating needing refills on his Stibild. [facility]. Called pt back inform him per chart medication was refilled by Dr. Staci Righterobert Comer on 12/14...Raechel Chute/lmb

## 2015-10-20 ENCOUNTER — Other Ambulatory Visit: Payer: Self-pay | Admitting: Family

## 2015-10-24 ENCOUNTER — Telehealth: Payer: Self-pay | Admitting: Family

## 2015-10-24 ENCOUNTER — Ambulatory Visit: Payer: 59 | Admitting: Family Medicine

## 2015-10-24 NOTE — Telephone Encounter (Signed)
Patient called advising that he has new insurance through [employer] covg. States that pharmacy told him that PA is needed for VIIBRYD 10 MG TABS [[id number]][[id number]] . Please follow up

## 2015-10-24 NOTE — Telephone Encounter (Signed)
New RX covg is [employer]: [id number] Subscriber ID [id number] Group # [id number]

## 2015-10-25 NOTE — Telephone Encounter (Signed)
Did call pt and LVM letting him know I was working on the [place]

## 2015-10-26 ENCOUNTER — Telehealth: Payer: Self-pay

## 2015-10-26 NOTE — Telephone Encounter (Signed)
PA done for Viibryd.

## 2015-10-26 NOTE — Telephone Encounter (Signed)
PA initiated via covermymeds. Key for PA is [id number]

## 2015-10-27 ENCOUNTER — Telehealth: Payer: Self-pay

## 2015-10-27 NOTE — Telephone Encounter (Signed)
PA has been approved called pharmacy and patient to make aware.

## 2015-10-27 NOTE — Telephone Encounter (Signed)
PA approved for viibryd

## 2015-11-02 ENCOUNTER — Other Ambulatory Visit: Payer: Self-pay | Admitting: *Deleted

## 2015-11-02 DIAGNOSIS — B20 Human immunodeficiency virus [HIV] disease: Secondary | ICD-10-CM

## 2015-11-02 MED ORDER — ELVITEG-COBIC-EMTRICIT-TENOFDF 150-150-200-300 MG PO TABS: 1.0000 | ORAL_TABLET | Freq: Every day | ORAL | Status: DC

## 2015-11-13 ENCOUNTER — Other Ambulatory Visit: Payer: Self-pay | Admitting: Family

## 2015-12-20 ENCOUNTER — Other Ambulatory Visit: Payer: Self-pay | Admitting: Family

## 2016-01-18 ENCOUNTER — Other Ambulatory Visit: Payer: Self-pay | Admitting: Family

## 2016-04-05 ENCOUNTER — Ambulatory Visit (INDEPENDENT_AMBULATORY_CARE_PROVIDER_SITE_OTHER): Payer: BLUE CROSS/BLUE SHIELD | Admitting: Family

## 2016-04-05 ENCOUNTER — Encounter: Payer: Self-pay | Admitting: Family

## 2016-04-05 VITALS — BP 110/80 | HR 76 | Temp 98.1°F | Resp 16 | Ht 68.0 in | Wt 134.0 lb

## 2016-04-05 DIAGNOSIS — F411 Generalized anxiety disorder: Secondary | ICD-10-CM | POA: Diagnosis not present

## 2016-04-05 DIAGNOSIS — F332 Major depressive disorder, recurrent severe without psychotic features: Secondary | ICD-10-CM

## 2016-04-05 DIAGNOSIS — R6882 Decreased libido: Secondary | ICD-10-CM | POA: Diagnosis not present

## 2016-04-05 HISTORY — DX: Decreased libido: R68.82

## 2016-04-05 HISTORY — DX: Generalized anxiety disorder: F41.1

## 2016-04-05 MED ORDER — BUSPIRONE HCL 7.5 MG PO TABS: 7.5000 mg | ORAL_TABLET | Freq: Two times a day (BID) | ORAL | Status: DC

## 2016-04-05 MED ORDER — VILAZODONE HCL 20 MG PO TABS: 20.0000 mg | ORAL_TABLET | Freq: Every day | ORAL | Status: DC

## 2016-04-05 NOTE — Assessment & Plan Note (Signed)
Experiencing decreased libido and fatigue with concern for possible testosterone deficiency. Obtain testosterone. Cannot rule out other metabolic causes of fatigue at this time including treatments for HIV. Follow-up and additional treatment pending testosterone results.

## 2016-04-05 NOTE — Progress Notes (Signed)
Pre visit review using our clinic review tool, if applicable. No additional management support is needed unless otherwise documented below in the visit note. 

## 2016-04-05 NOTE — Patient Instructions (Addendum)
Thank you for choosing [facility].  Summary/Instructions:  Please increase your Viibryd to 20 mg daily.  Start Buspar  Please stop by the lab for testosterone between 8-10am. No fasting is needed.  Your prescription(s) have been submitted to your pharmacy or been printed and provided for you. Please take as directed and contact our office if you believe you are having problem(s) with the medication(s) or have any questions.  Please stop by the lab on the basement level of the building for your blood work. Your results will be released to MyChart (or called to you) after review, usually within 72 hours after test completion. If any changes need to be made, you will be notified at that same time.  If your symptoms worsen or fail to improve, please contact our office for further instruction, or in case of emergency go directly to the emergency room at the closest medical facility.

## 2016-04-05 NOTE — Assessment & Plan Note (Addendum)
Anxiety increase most likely related to previous tobacco cessation approximately one month ago. Discussed importance of stress and stress management. Start buspirone. Attempt to avoid benzodiazepines as patient's previous history is positive for substance abuse and addiction. Follow-up in one month or sooner if needed.

## 2016-04-05 NOTE — Progress Notes (Signed)
Subjective:    Patient ID: Frank Webster, male    DOB: 12-10-1975, 40 y.o.   MRN: [id number]  Chief Complaint  Patient presents with  . Medication Management    stopped smoking and feels very anxious and depressed, would like to see about something for anxiety    HPI:  Frank Webster is a 40 y.o. male who  has a past medical history of HIV infection (HCC); Depression; and Substance abuse. and presents today for a follow up office visit.   1.) Anxiety and depression - Currently maintained on Viibryd. Reports taking the medication as prescribed and denies adverse side effects. Reports feeling anxious and depressed secondary to stopping smoking for the past 31 days. Feeling very uneasy and agitated and overwhelmed mixed in. Believes that this was increased with the tobacco cessation. Modifying factors include working with therapy. Attributes that this may be related his busy lifestyle and too much to. Stress relief includes the gym on occasion. Denies suicidal ideation.   2.) Decreased libido - This is a new problem. Associated symptom of decreased libido and decreased strength of erections have been going on for a couple months. Does express some fatigue as well. Denies any fevers. No modifying factors that make it better or worse.  No Known Allergies   Current Outpatient Prescriptions on File Prior to Visit  Medication Sig Dispense Refill  . cholecalciferol (VITAMIN D) 1000 UNITS tablet Take 2,000 Units by mouth daily.     Marland Kitchen. elvitegravir-cobicistat-emtricitabine-tenofovir (STRIBILD) 150-150-200-300 MG TABS tablet Take 1 tablet by mouth daily with breakfast. 30 tablet 5  . glucosamine-chondroitin 500-400 MG tablet Take 1 tablet by mouth 3 (three) times daily.    . Melatonin 1 MG TABS Take by mouth.    . NON FORMULARY Tumeric    . NON FORMULARY L-Theanine    . Omega-3 Fatty Acids (FISH OIL PO) Take by mouth.    . vitamin C (ASCORBIC ACID) 250 MG tablet Take 250 mg by mouth daily.      No current facility-administered medications on file prior to visit.    Past Medical History  Diagnosis Date  . HIV infection (HCC)   . Depression   . Substance abuse      Review of Systems  Constitutional: Negative for fever and chills.  Respiratory: Negative for chest tightness and shortness of breath.   Cardiovascular: Negative for chest pain, palpitations and leg swelling.  Psychiatric/Behavioral: Positive for dysphoric mood. Negative for sleep disturbance. The patient is nervous/anxious.       Objective:    BP 110/80 mmHg  Pulse 76  Temp(Src) 98.1 F (36.7 C) (Oral)  Resp 16  Ht 5\' 8"  (1.727 m)  Wt 134 lb (60.782 kg)  BMI 20.38 kg/m2  SpO2 98% Nursing note and vital signs reviewed.  Physical Exam  Constitutional: He is oriented to person, place, and time. He appears well-developed and well-nourished. No distress.  Cardiovascular: Normal rate, regular rhythm, normal heart sounds and intact distal pulses.   Pulmonary/Chest: Effort normal and breath sounds normal.  Neurological: He is alert and oriented to person, place, and time.  Skin: Skin is warm and dry.  Psychiatric: His behavior is normal. Judgment and thought content normal. His mood appears anxious.       Assessment & Plan:   Problem List Items Addressed This Visit      Other   Depression, major (HCC) - Primary    Depression appears labile with current regimen. No suicidal ideations. Most likely  exacerbated from tobacco cessation. Increase Viibryd. Continue counseling. Follow-up in one month or sooner. Seek emergency care if symptoms of suicidal ideation and plan develop.      Relevant Medications   Vilazodone HCl (VIIBRYD) 20 MG TABS   busPIRone (BUSPAR) 7.5 MG tablet   Anxiety state    Anxiety increase most likely related to previous tobacco cessation approximately one month ago. Discussed importance of stress and stress management. Start buspirone. Attempt to avoid benzodiazepines as patient's  previous history is positive for substance abuse and addiction. Follow-up in one month or sooner if needed.      Relevant Medications   Vilazodone HCl (VIIBRYD) 20 MG TABS   busPIRone (BUSPAR) 7.5 MG tablet   Decreased libido    Experiencing decreased libido and fatigue with concern for possible testosterone deficiency. Obtain testosterone. Cannot rule out other metabolic causes of fatigue at this time including treatments for HIV. Follow-up and additional treatment pending testosterone results.      Relevant Orders   Testosterone       I have discontinued Mr. Magda Bernheimremols's Vilazodone HCl. I am also having him start on Vilazodone HCl and busPIRone. Additionally, I am having him maintain his Omega-3 Fatty Acids (FISH OIL PO), NON FORMULARY, NON FORMULARY, vitamin C, glucosamine-chondroitin, cholecalciferol, Melatonin, elvitegravir-cobicistat-emtricitabine-tenofovir, folic acid, and magnesium.   Meds ordered this encounter  Medications  . folic acid (FOLVITE) 800 MCG tablet    Sig: Take 1,200 mcg by mouth daily.  . magnesium 30 MG tablet    Sig: Take 30 mg by mouth 2 (two) times daily.  . Vilazodone HCl (VIIBRYD) 20 MG TABS    Sig: Take 1 tablet (20 mg total) by mouth daily.    Dispense:  30 tablet    Refill:  3    Order Specific Question:  Supervising Provider    Answer:  Hillard DankerRAWFORD, ELIZABETH A [[id number]]  . busPIRone (BUSPAR) 7.5 MG tablet    Sig: Take 1 tablet (7.5 mg total) by mouth 2 (two) times daily.    Dispense:  60 tablet    Refill:  0    Order Specific Question:  Supervising Provider    Answer:  Hillard DankerRAWFORD, ELIZABETH A [[id number]]     Follow-up: Return in about 1 month (around 05/05/2016).  Jeanine Luzalone, Zollie Clemence, FNP

## 2016-04-05 NOTE — Assessment & Plan Note (Signed)
Depression appears labile with current regimen. No suicidal ideations. Most likely exacerbated from tobacco cessation. Increase Viibryd. Continue counseling. Follow-up in one month or sooner. Seek emergency care if symptoms of suicidal ideation and plan develop.

## 2016-04-20 ENCOUNTER — Other Ambulatory Visit: Payer: Self-pay | Admitting: *Deleted

## 2016-04-20 DIAGNOSIS — B20 Human immunodeficiency virus [HIV] disease: Secondary | ICD-10-CM

## 2016-04-20 MED ORDER — ELVITEG-COBIC-EMTRICIT-TENOFDF 150-150-200-300 MG PO TABS: 1.0000 | ORAL_TABLET | Freq: Every day | ORAL | Status: DC

## 2016-04-26 ENCOUNTER — Encounter: Payer: Self-pay | Admitting: Family

## 2016-04-26 ENCOUNTER — Other Ambulatory Visit (INDEPENDENT_AMBULATORY_CARE_PROVIDER_SITE_OTHER): Payer: BLUE CROSS/BLUE SHIELD

## 2016-04-26 DIAGNOSIS — R6882 Decreased libido: Secondary | ICD-10-CM | POA: Diagnosis not present

## 2016-04-26 LAB — TESTOSTERONE: TESTOSTERONE: 539.56 ng/dL (ref 300.00–890.00)

## 2016-04-29 ENCOUNTER — Other Ambulatory Visit: Payer: Self-pay | Admitting: Family

## 2016-05-01 ENCOUNTER — Other Ambulatory Visit: Payer: Self-pay | Admitting: Internal Medicine

## 2016-05-07 ENCOUNTER — Ambulatory Visit (INDEPENDENT_AMBULATORY_CARE_PROVIDER_SITE_OTHER): Payer: BLUE CROSS/BLUE SHIELD | Admitting: Family

## 2016-05-07 ENCOUNTER — Encounter: Payer: Self-pay | Admitting: Family

## 2016-05-07 VITALS — BP 120/74 | HR 65 | Temp 98.0°F | Resp 18 | Ht 68.0 in | Wt 139.0 lb

## 2016-05-07 DIAGNOSIS — F411 Generalized anxiety disorder: Secondary | ICD-10-CM | POA: Diagnosis not present

## 2016-05-07 NOTE — Patient Instructions (Signed)
Thank you for choosing [facility].  Summary/Instructions:  Decrease dose of Buspar or take second dose around lunchtime.  Continue current dosage of Viibryd.   If your symptoms worsen or fail to improve, please contact our office for further instruction, or in case of emergency go directly to the emergency room at the closest medical facility.

## 2016-05-07 NOTE — Progress Notes (Signed)
Subjective:    Patient ID: Frank Webster, male    DOB: Mar 23, 1976, 40 y.o.   MRN: [id number]  Chief Complaint  Patient presents with  . Follow-up    anxiety is much better but having crazy dreams to where they are stress induced    HPI:  Frank Webster is a 40 y.o. male who  has a past medical history of Depression; HIV infection (HCC); and Substance abuse. and presents today For a follow-up office visit.  1.) Anxiety - this is a chronic problem. Currently maintained on Viibryd and Buspar. Reports taking the medications as prescribed. Notes the side effects of abnormal dreams that occurs on an every night basis. Reports still remaining rested and that is symptoms are adequately controlled.   No Known Allergies   Current Outpatient Prescriptions on File Prior to Visit  Medication Sig Dispense Refill  . busPIRone (BUSPAR) 7.5 MG tablet TAKE 1 TABLET(7.5 MG) BY MOUTH TWICE DAILY 60 tablet 5  . cholecalciferol (VITAMIN D) 1000 UNITS tablet Take 2,000 Units by mouth daily.     Marland Kitchen elvitegravir-cobicistat-emtricitabine-tenofovir (STRIBILD) 150-150-200-300 MG TABS tablet Take 1 tablet by mouth daily with breakfast. 30 tablet 2  . folic acid (FOLVITE) 800 MCG tablet Take 1,200 mcg by mouth daily.    Marland Kitchen glucosamine-chondroitin 500-400 MG tablet Take 1 tablet by mouth 3 (three) times daily.    . magnesium 30 MG tablet Take 30 mg by mouth 2 (two) times daily.    . Melatonin 1 MG TABS Take by mouth.    . NON FORMULARY Tumeric    . NON FORMULARY L-Theanine    . Omega-3 Fatty Acids (FISH OIL PO) Take by mouth.    . Vilazodone HCl (VIIBRYD) 20 MG TABS Take 1 tablet (20 mg total) by mouth daily. 30 tablet 3  . vitamin C (ASCORBIC ACID) 250 MG tablet Take 250 mg by mouth daily.     No current facility-administered medications on file prior to visit.      Review of Systems  Constitutional: Negative for chills and fever.  Psychiatric/Behavioral: Negative for suicidal ideas. The patient is  not nervous/anxious.       Objective:    BP 120/74 (BP Location: Left Arm, Patient Position: Sitting, Cuff Size: Normal)   Pulse 65   Temp 98 F (36.7 C) (Oral)   Resp 18   Ht 5\' 8"  (1.727 m)   Wt 139 lb (63 kg)   SpO2 97%   BMI 21.13 kg/m  Nursing note and vital signs reviewed.  Physical Exam  Constitutional: He is oriented to person, place, and time. He appears well-developed and well-nourished. No distress.  Cardiovascular: Normal rate, regular rhythm, normal heart sounds and intact distal pulses.   Pulmonary/Chest: Effort normal and breath sounds normal.  Neurological: He is alert and oriented to person, place, and time.  Skin: Skin is warm and dry.  Psychiatric: He has a normal mood and affect. His behavior is normal. Judgment and thought content normal. His mood appears not anxious. He does not exhibit a depressed mood.       Assessment & Plan:   Problem List Items Addressed This Visit      Other   Anxiety state - Primary    Symptoms and anxiety are well controlled with current regimen, however experiencing abnormal dreams. Adjusting medication to determine which of the 2 medications is causing these symptoms. Decrease BuSpar to daily or take second dose in the early afternoon. Will continue current dosage of  Viibryd. Follow up if symptoms worsen or do not improve.        Other Visit Diagnoses   None.      I am having Mr. Carton maintain his Omega-3 Fatty Acids (FISH OIL PO), NON FORMULARY, NON FORMULARY, vitamin C, glucosamine-chondroitin, cholecalciferol, Melatonin, folic acid, magnesium, Vilazodone HCl, elvitegravir-cobicistat-emtricitabine-tenofovir, and busPIRone.   Follow-up: Return in about 1 month (around 06/07/2016), or if symptoms worsen or fail to improve.  Jeanine Luz, FNP

## 2016-05-07 NOTE — Assessment & Plan Note (Signed)
Symptoms and anxiety are well controlled with current regimen, however experiencing abnormal dreams. Adjusting medication to determine which of the 2 medications is causing these symptoms. Decrease BuSpar to daily or take second dose in the early afternoon. Will continue current dosage of Viibryd. Follow up if symptoms worsen or do not improve.

## 2016-05-10 ENCOUNTER — Other Ambulatory Visit: Payer: Self-pay | Admitting: *Deleted

## 2016-05-10 DIAGNOSIS — Z113 Encounter for screening for infections with a predominantly sexual mode of transmission: Secondary | ICD-10-CM

## 2016-05-10 DIAGNOSIS — Z79899 Other long term (current) drug therapy: Secondary | ICD-10-CM

## 2016-05-10 DIAGNOSIS — B20 Human immunodeficiency virus [HIV] disease: Secondary | ICD-10-CM

## 2016-05-29 ENCOUNTER — Other Ambulatory Visit: Payer: BLUE CROSS/BLUE SHIELD

## 2016-05-29 DIAGNOSIS — Z113 Encounter for screening for infections with a predominantly sexual mode of transmission: Secondary | ICD-10-CM

## 2016-05-29 DIAGNOSIS — Z79899 Other long term (current) drug therapy: Secondary | ICD-10-CM

## 2016-05-29 DIAGNOSIS — B20 Human immunodeficiency virus [HIV] disease: Secondary | ICD-10-CM

## 2016-05-29 LAB — COMPLETE METABOLIC PANEL WITH GFR
ALBUMIN: 4.6 g/dL (ref 3.6–5.1)
ALK PHOS: 70 U/L (ref 40–115)
ALT: 48 U/L — AB (ref 9–46)
AST: 27 U/L (ref 10–40)
BUN: 14 mg/dL (ref 7–25)
CO2: 29 mmol/L (ref 20–31)
Calcium: 9.5 mg/dL (ref 8.6–10.3)
Chloride: 99 mmol/L (ref 98–110)
Creat: 0.85 mg/dL (ref 0.60–1.35)
GFR, Est African American: >89 mL/min (ref >=60)
GFR, Est Non African American: >89 mL/min (ref >=60)
GLUCOSE: 87 mg/dL (ref 65–99)
Potassium: 4.5 mmol/L (ref 3.5–5.3)
SODIUM: 138 mmol/L (ref 135–146)
TOTAL PROTEIN: 7.3 g/dL (ref 6.1–8.1)
Total Bilirubin: 0.4 mg/dL (ref 0.2–1.2)

## 2016-05-29 LAB — CBC WITH DIFFERENTIAL/PLATELET
BASOS ABS: 0 {cells}/uL (ref 0–200)
Basophils Relative: 0 %
EOS ABS: 80 {cells}/uL (ref 15–500)
EOS PCT: 2 %
HEMATOCRIT: 43.4 % (ref 38.5–50.0)
Hemoglobin: 15.1 g/dL (ref 13.2–17.1)
Lymphocytes Relative: 49 %
Lymphs Abs: 1960 cells/uL (ref 850–3900)
MCH: 31.3 pg (ref 27.0–33.0)
MCHC: 34.8 g/dL (ref 32.0–36.0)
MCV: 89.9 fL (ref 80.0–100.0)
MONOS PCT: 9 %
MPV: 9.5 fL (ref 7.5–12.5)
Monocytes Absolute: 360 cells/uL (ref 200–950)
NEUTROS PCT: 40 %
Neutro Abs: 1600 cells/uL (ref 1500–7800)
Platelets: 243 10*3/uL (ref 140–400)
RBC: 4.83 MIL/uL (ref 4.20–5.80)
RDW: 13.7 % (ref 11.0–15.0)
WBC: 4 10*3/uL (ref 3.8–10.8)

## 2016-05-29 LAB — LIPID PANEL
CHOLESTEROL: 204 mg/dL — AB (ref 125–200)
HDL: 27 mg/dL — ABNORMAL LOW (ref >=40)
LDL Cholesterol: 157 mg/dL — ABNORMAL HIGH (ref <130)
Total CHOL/HDL Ratio: 7.6 Ratio — ABNORMAL HIGH (ref <=5.0)
Triglycerides: 102 mg/dL (ref <150)
VLDL: 20 mg/dL (ref <30)

## 2016-05-30 LAB — RPR

## 2016-05-30 LAB — T-HELPER CELL (CD4) - (RCID CLINIC ONLY)
CD4 % Helper T Cell: 36 % (ref 33–55)
CD4 T Cell Abs: 720 /uL (ref 400–2700)

## 2016-05-31 LAB — HIV-1 RNA QUANT-NO REFLEX-BLD
HIV 1 RNA Quant: 50 copies/mL — ABNORMAL HIGH (ref <20)
HIV-1 RNA QUANT, LOG: 1.7 {Log_copies}/mL — AB (ref <1.30)

## 2016-06-07 LAB — HLA B*5701: HLA-B*5701 w/rflx HLA-B High: NEGATIVE

## 2016-06-12 ENCOUNTER — Encounter: Payer: Self-pay | Admitting: Internal Medicine

## 2016-06-12 ENCOUNTER — Ambulatory Visit (INDEPENDENT_AMBULATORY_CARE_PROVIDER_SITE_OTHER): Payer: BLUE CROSS/BLUE SHIELD | Admitting: Internal Medicine

## 2016-06-12 VITALS — BP 118/72 | HR 77 | Temp 98.3°F | Ht 68.0 in | Wt 139.0 lb

## 2016-06-12 DIAGNOSIS — Z23 Encounter for immunization: Secondary | ICD-10-CM | POA: Diagnosis not present

## 2016-06-12 DIAGNOSIS — B20 Human immunodeficiency virus [HIV] disease: Secondary | ICD-10-CM | POA: Diagnosis not present

## 2016-06-12 MED ORDER — ELVITEG-COBIC-EMTRICIT-TENOFAF 150-150-200-10 MG PO TABS: 1.0000 | ORAL_TABLET | Freq: Every day | ORAL | 11 refills | Status: DC

## 2016-06-12 NOTE — Progress Notes (Signed)
CC: Follow up for HIV  Interval history: Currently is well-controlled on Stribild.  Has not been here in over 1 year.  Since last visit has been on anxiety medications.  He did have an episode of severe depression around December 2016 but responded to Vibryd.  He is doing well but still nervous about his mental health.  He is working and in graduate school and not much time to rest.  Gets nervous with speaking in front of groups and wondering if it is related to anxiety.    Prior to Admission medications   Medication Sig Start Date End Date Taking? Authorizing Provider  cholecalciferol (VITAMIN D) 1000 UNITS tablet Take 2,000 Units by mouth daily.    Yes Historical Provider, MD  elvitegravir-cobicistat-emtricitabine-tenofovir (STRIBILD) 150-150-200-300 MG TABS tablet Take 1 tablet by mouth daily with breakfast. 04/20/16  Yes Gardiner Barefootobert W Odena Mcquaid, MD  folic acid (FOLVITE) 800 MCG tablet Take 1,200 mcg by mouth daily.   Yes Historical Provider, MD  glucosamine-chondroitin 500-400 MG tablet Take 1 tablet by mouth 3 (three) times daily.   Yes Historical Provider, MD  Melatonin 1 MG TABS Take by mouth.   Yes Historical Provider, MD  NON FORMULARY Tumeric   Yes Historical Provider, MD  Omega-3 Fatty Acids (FISH OIL PO) Take by mouth.   Yes Historical Provider, MD  Vilazodone HCl (VIIBRYD) 20 MG TABS Take 1 tablet (20 mg total) by mouth daily. 04/05/16  Yes Veryl SpeakGregory D Calone, FNP  vitamin C (ASCORBIC ACID) 250 MG tablet Take 250 mg by mouth daily.   Yes Historical Provider, MD    Review of Systems Constitutional: negative for fatigue and malaise Gastrointestinal: negative for diarrhea Musculoskeletal: negative for myalgias and arthralgias All other systems reviewed and are negative   Physical Exam: CONSTITUTIONAL:in no apparent distress and alert  Vitals:   06/12/16 1056  BP: 118/72  Pulse: 77  Temp: 98.3 F (36.8 C)   Eyes: anicteric HENT: no thrush, no cervical lymphadenopathy Respiratory: Normal  respiratory effort; CTA B Cardiovascular: RRR  Lab Results  Component Value Date   HIV1RNAQUANT 50 (H) 05/29/2016   HIV1RNAQUANT 42 (H) 09/26/2015   HIV1RNAQUANT <20 03/08/2015   No components found for: HIV1GENOTYPRPLUS No components found for: THELPERCELL  Social History   Social History  . Marital status: Single    Spouse name: N/A  . Number of children: 0  . Years of education: 4916   Occupational History  . Server    Social History Main Topics  . Smoking status: Former Smoker    Packs/day: 0.75    Years: 25.00    Types: Cigarettes    Quit date: 03/04/2016  . Smokeless tobacco: Never Used     Comment: wants to discuss options today  . Alcohol use No     Comment: in recovery  . Drug use: No     Comment: in recovery  . Sexual activity: No     Comment: pt. declined condoms   Other Topics Concern  . Not on file   Social History Narrative   Fun: Yoga, hiking, going to the gym.    Recently stopped smoking about 3 months ago Recently started sexual activity again

## 2016-07-26 ENCOUNTER — Other Ambulatory Visit: Payer: Self-pay | Admitting: Family

## 2016-07-26 DIAGNOSIS — F332 Major depressive disorder, recurrent severe without psychotic features: Secondary | ICD-10-CM

## 2016-08-22 ENCOUNTER — Other Ambulatory Visit: Payer: Self-pay | Admitting: Family

## 2016-08-22 DIAGNOSIS — F332 Major depressive disorder, recurrent severe without psychotic features: Secondary | ICD-10-CM

## 2016-09-22 ENCOUNTER — Other Ambulatory Visit: Payer: Self-pay | Admitting: Family

## 2016-09-22 DIAGNOSIS — F332 Major depressive disorder, recurrent severe without psychotic features: Secondary | ICD-10-CM

## 2016-10-19 ENCOUNTER — Other Ambulatory Visit: Payer: Self-pay | Admitting: Family

## 2016-10-19 DIAGNOSIS — F332 Major depressive disorder, recurrent severe without psychotic features: Secondary | ICD-10-CM

## 2016-11-27 ENCOUNTER — Other Ambulatory Visit: Payer: BLUE CROSS/BLUE SHIELD

## 2016-11-27 DIAGNOSIS — B20 Human immunodeficiency virus [HIV] disease: Secondary | ICD-10-CM

## 2016-11-28 LAB — T-HELPER CELL (CD4) - (RCID CLINIC ONLY)
CD4 % Helper T Cell: 39 % (ref 33–55)
CD4 T CELL ABS: 900 /uL (ref 400–2700)

## 2016-12-03 LAB — HIV-1 RNA QUANT-NO REFLEX-BLD
HIV 1 RNA QUANT: 88 {copies}/mL — AB
HIV-1 RNA QUANT, LOG: 1.94 {Log_copies}/mL — AB

## 2016-12-11 ENCOUNTER — Ambulatory Visit (INDEPENDENT_AMBULATORY_CARE_PROVIDER_SITE_OTHER): Payer: BLUE CROSS/BLUE SHIELD | Admitting: Internal Medicine

## 2016-12-11 ENCOUNTER — Encounter: Payer: Self-pay | Admitting: Internal Medicine

## 2016-12-11 VITALS — BP 106/70 | HR 66 | Temp 98.1°F | Ht 67.0 in | Wt 143.0 lb

## 2016-12-11 DIAGNOSIS — Z79899 Other long term (current) drug therapy: Secondary | ICD-10-CM

## 2016-12-11 DIAGNOSIS — F411 Generalized anxiety disorder: Secondary | ICD-10-CM | POA: Diagnosis not present

## 2016-12-11 DIAGNOSIS — Z113 Encounter for screening for infections with a predominantly sexual mode of transmission: Secondary | ICD-10-CM

## 2016-12-11 DIAGNOSIS — B9789 Other viral agents as the cause of diseases classified elsewhere: Secondary | ICD-10-CM

## 2016-12-11 DIAGNOSIS — J069 Acute upper respiratory infection, unspecified: Secondary | ICD-10-CM | POA: Insufficient documentation

## 2016-12-11 DIAGNOSIS — B20 Human immunodeficiency virus [HIV] disease: Secondary | ICD-10-CM | POA: Diagnosis not present

## 2016-12-11 MED ORDER — GUAIFENESIN ER 600 MG PO TB12: 600.0000 mg | ORAL_TABLET | Freq: Two times a day (BID) | ORAL | 1 refills | Status: DC | PRN

## 2016-12-11 NOTE — Progress Notes (Signed)
CC: Follow up for HIV  Interval history: Currently is asymptomatic and well-controlled on Genvoya.  Since last visit has continued to have issues with anxiety and feels his medication prescribed by his PCP is not working.  No SI.  Also recent URI symptoms.  Has no associated n/v/d.  Denies any missed doses.       Prior to Admission medications   Medication Sig Start Date End Date Taking? Authorizing Provider  cholecalciferol (VITAMIN D) 1000 UNITS tablet Take 2,000 Units by mouth daily.    Yes Historical Provider, MD  elvitegravir-cobicistat-emtricitabine-tenofovir (GENVOYA) 150-150-200-10 MG TABS tablet Take 1 tablet by mouth daily. 06/12/16  Yes Gardiner Barefootobert W Comer, MD  folic acid (FOLVITE) 800 MCG tablet Take 1,200 mcg by mouth daily.   Yes Historical Provider, MD  glucosamine-chondroitin 500-400 MG tablet Take 1 tablet by mouth 3 (three) times daily.   Yes Historical Provider, MD  Melatonin 1 MG TABS Take by mouth.   Yes Historical Provider, MD  NON FORMULARY Tumeric   Yes Historical Provider, MD  Omega-3 Fatty Acids (FISH OIL PO) Take by mouth.   Yes Historical Provider, MD  VIIBRYD 20 MG TABS TAKE 1 TABLET(20 MG) BY MOUTH DAILY 10/19/16  Yes Veryl SpeakGregory D Calone, FNP  vitamin C (ASCORBIC ACID) 250 MG tablet Take 250 mg by mouth daily.   Yes Historical Provider, MD    Review of Systems Constitutional: negative for fevers and chills; had a recent bout with fever though, chills, exacerbated by anxiety Musculoskeletal: negative for arthralgias; has had recent myalgias Behavioral/Psych: positive for anxiety  Physical Exam: CONSTITUTIONAL:in no apparent distress  Vitals:   12/11/16 1105  BP: 106/70  Pulse: 66  Temp: 98.1 F (36.7 C)   Eyes: anicteric HENT: no thrush, no cervical lymphadenopathy Respiratory: Normal respiratory effort; CTA B  Cardiovascular: RRR  Lab Results  Component Value Date   HIV1RNAQUANT 88 (H) 11/27/2016   HIV1RNAQUANT 50 (H) 05/29/2016   HIV1RNAQUANT 42 (H)  09/26/2015   No components found for: HIV1GENOTYPRPLUS No components found for: THELPERCELL   SH: remains drug free, tobacco free

## 2016-12-12 NOTE — Assessment & Plan Note (Signed)
Doing well with this with a CD4 of 900 and viral load suppressed at 88.

## 2016-12-12 NOTE — Assessment & Plan Note (Signed)
Seems to be resolving.  Has had fever but more c/w URI than flu.  Continue supportive care, will prescribe mucinex.

## 2016-12-12 NOTE — Assessment & Plan Note (Signed)
I will refer him to psychiatry.  He is continuing to see a Warden/rangerpsychologist.

## 2017-05-15 ENCOUNTER — Other Ambulatory Visit: Payer: BLUE CROSS/BLUE SHIELD

## 2017-07-02 ENCOUNTER — Other Ambulatory Visit: Payer: Self-pay | Admitting: Internal Medicine

## 2017-07-13 ENCOUNTER — Other Ambulatory Visit: Payer: Self-pay | Admitting: Family

## 2017-07-22 ENCOUNTER — Other Ambulatory Visit: Payer: Self-pay | Admitting: Internal Medicine

## 2017-07-22 DIAGNOSIS — B20 Human immunodeficiency virus [HIV] disease: Secondary | ICD-10-CM

## 2017-08-16 ENCOUNTER — Other Ambulatory Visit: Payer: Self-pay | Admitting: Internal Medicine

## 2017-08-16 DIAGNOSIS — B20 Human immunodeficiency virus [HIV] disease: Secondary | ICD-10-CM

## 2017-08-22 ENCOUNTER — Other Ambulatory Visit (HOSPITAL_COMMUNITY)
Admission: RE | Admit: 2017-08-22 | Discharge: 2017-08-22 | Disposition: A | Discharge status: Home / Self Care | Payer: BLUE CROSS/BLUE SHIELD | Source: Ambulatory Visit | Attending: Internal Medicine | Admitting: Internal Medicine

## 2017-08-22 ENCOUNTER — Other Ambulatory Visit: Payer: BLUE CROSS/BLUE SHIELD

## 2017-08-22 DIAGNOSIS — Z113 Encounter for screening for infections with a predominantly sexual mode of transmission: Secondary | ICD-10-CM

## 2017-08-22 DIAGNOSIS — B20 Human immunodeficiency virus [HIV] disease: Secondary | ICD-10-CM | POA: Insufficient documentation

## 2017-08-22 DIAGNOSIS — Z79899 Other long term (current) drug therapy: Secondary | ICD-10-CM

## 2017-08-22 NOTE — Addendum Note (Signed)
Addended by: Alesia MorinPOOLE, Farran Amsden F on: 08/22/2017 11:43 AM   Modules accepted: Orders

## 2017-08-23 LAB — CBC WITH DIFFERENTIAL/PLATELET
BASOS ABS: 9 {cells}/uL (ref 0–200)
Basophils Relative: 0.2 %
EOS ABS: 71 {cells}/uL (ref 15–500)
EOS PCT: 1.5 %
HEMATOCRIT: 41.8 % (ref 38.5–50.0)
Hemoglobin: 14.2 g/dL (ref 13.2–17.1)
Lymphs Abs: 2453 cells/uL (ref 850–3900)
MCH: 31.1 pg (ref 27.0–33.0)
MCHC: 34 g/dL (ref 32.0–36.0)
MCV: 91.5 fL (ref 80.0–100.0)
MONOS PCT: 7.2 %
MPV: 10 fL (ref 7.5–12.5)
NEUTROS PCT: 38.9 %
Neutro Abs: 1828 cells/uL (ref 1500–7800)
Platelets: 256 10*3/uL (ref 140–400)
RBC: 4.57 10*6/uL (ref 4.20–5.80)
RDW: 13.1 % (ref 11.0–15.0)
Total Lymphocyte: 52.2 %
WBC mixed population: 338 cells/uL (ref 200–950)
WBC: 4.7 10*3/uL (ref 3.8–10.8)

## 2017-08-23 LAB — COMPLETE METABOLIC PANEL WITH GFR
AG Ratio: 1.5 (calc) (ref 1.0–2.5)
ALKALINE PHOSPHATASE (APISO): 66 U/L (ref 40–115)
ALT: 46 U/L (ref 9–46)
AST: 25 U/L (ref 10–40)
Albumin: 4.3 g/dL (ref 3.6–5.1)
BILIRUBIN TOTAL: 0.4 mg/dL (ref 0.2–1.2)
BUN: 15 mg/dL (ref 7–25)
CHLORIDE: 100 mmol/L (ref 98–110)
CO2: 28 mmol/L (ref 20–32)
CREATININE: 0.76 mg/dL (ref 0.60–1.35)
Calcium: 9.4 mg/dL (ref 8.6–10.3)
GFR, Est African American: 130 mL/min/{1.73_m2} (ref >=60)
GFR, Est Non African American: 112 mL/min/{1.73_m2} (ref >=60)
GLOBULIN: 2.9 g/dL (ref 1.9–3.7)
GLUCOSE: 101 mg/dL — AB (ref 65–99)
Potassium: 4.1 mmol/L (ref 3.5–5.3)
SODIUM: 136 mmol/L (ref 135–146)
Total Protein: 7.2 g/dL (ref 6.1–8.1)

## 2017-08-23 LAB — T-HELPER CELL (CD4) - (RCID CLINIC ONLY)
CD4 % Helper T Cell: 41 % (ref 33–55)
CD4 T Cell Abs: 1030 /uL (ref 400–2700)

## 2017-08-23 LAB — LIPID PANEL
CHOL/HDL RATIO: 5.3 (calc) — AB (ref <5.0)
Cholesterol: 228 mg/dL — ABNORMAL HIGH (ref <200)
HDL: 43 mg/dL (ref >40)
LDL CHOLESTEROL (CALC): 167 mg/dL — AB
NON-HDL CHOLESTEROL (CALC): 185 mg/dL — AB (ref <130)
TRIGLYCERIDES: 80 mg/dL (ref <150)

## 2017-08-23 LAB — RPR: RPR Ser Ql: NONREACTIVE

## 2017-08-23 LAB — URINE CYTOLOGY ANCILLARY ONLY
CHLAMYDIA, DNA PROBE: NEGATIVE
NEISSERIA GONORRHEA: NEGATIVE

## 2017-08-24 LAB — HIV-1 RNA QUANT-NO REFLEX-BLD
HIV 1 RNA QUANT: 46 {copies}/mL — AB
HIV-1 RNA Quant, Log: 1.66 Log copies/mL — ABNORMAL HIGH

## 2017-09-25 ENCOUNTER — Other Ambulatory Visit: Payer: Self-pay | Admitting: Internal Medicine

## 2017-09-25 DIAGNOSIS — B20 Human immunodeficiency virus [HIV] disease: Secondary | ICD-10-CM

## 2017-09-26 ENCOUNTER — Ambulatory Visit (INDEPENDENT_AMBULATORY_CARE_PROVIDER_SITE_OTHER): Payer: BLUE CROSS/BLUE SHIELD | Admitting: Infectious Diseases

## 2017-09-26 ENCOUNTER — Encounter: Payer: Self-pay | Admitting: Infectious Diseases

## 2017-09-26 ENCOUNTER — Other Ambulatory Visit (HOSPITAL_COMMUNITY)
Admission: RE | Admit: 2017-09-26 | Discharge: 2017-09-26 | Disposition: A | Discharge status: Home / Self Care | Payer: BLUE CROSS/BLUE SHIELD | Source: Ambulatory Visit | Attending: Infectious Diseases | Admitting: Infectious Diseases

## 2017-09-26 VITALS — Temp 97.8°F | Wt 152.0 lb

## 2017-09-26 DIAGNOSIS — N529 Male erectile dysfunction, unspecified: Secondary | ICD-10-CM | POA: Diagnosis not present

## 2017-09-26 DIAGNOSIS — Z202 Contact with and (suspected) exposure to infections with a predominantly sexual mode of transmission: Secondary | ICD-10-CM

## 2017-09-26 DIAGNOSIS — B20 Human immunodeficiency virus [HIV] disease: Secondary | ICD-10-CM | POA: Diagnosis not present

## 2017-09-26 DIAGNOSIS — E785 Hyperlipidemia, unspecified: Secondary | ICD-10-CM

## 2017-09-26 DIAGNOSIS — Z113 Encounter for screening for infections with a predominantly sexual mode of transmission: Secondary | ICD-10-CM | POA: Insufficient documentation

## 2017-09-26 DIAGNOSIS — Z23 Encounter for immunization: Secondary | ICD-10-CM

## 2017-09-26 DIAGNOSIS — Z21 Asymptomatic human immunodeficiency virus [HIV] infection status: Secondary | ICD-10-CM

## 2017-09-26 HISTORY — DX: Contact with and (suspected) exposure to infections with a predominantly sexual mode of transmission: Z20.2

## 2017-09-26 HISTORY — DX: Male erectile dysfunction, unspecified: N52.9

## 2017-09-26 MED ORDER — SILDENAFIL CITRATE 20 MG PO TABS: 20.0000 mg | ORAL_TABLET | Freq: Every day | ORAL | 3 refills | Status: DC | PRN

## 2017-09-26 NOTE — Patient Instructions (Addendum)
Stop taking your fish oil pills  Will let you know via MyChart about your lab results.   Will send in generic for sildenafil to your pharmacy.  - Try 1 - 3 tablet 60 minutes before sex. Absorbs best on an empty stomach. Do 1 at first because this can interact with Genvoya and make this new medication work better.  - Side effects can include low blood pressure and dizziness.  - If you experience an erection lasting longer than 4 hours seek care at an emergency room.   Will have you back in 6 months with labs 2 weeks before please. Thank you!
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
## 2017-09-26 NOTE — Assessment & Plan Note (Signed)
Continue Genvoya. Discussed his low-level viremia with copies < 100 the last year. He has had HIV a long time per his account. At this point I cannot say with 100% certainty he is at zero risk for transmission since that statement is in the context of true undetectable VL and by our assay he is still detectable. Advised his risk is very low at current level but would always advise condoms to which he took today. Will return in 6 months to follow up either with me or Dr. Luciana Axeomer.

## 2017-09-26 NOTE — Assessment & Plan Note (Signed)
Reviewed cholesterol with him. Overall CVD calculator with 2.2% 10-year risk, excluding his HIV independent risk factor. I have advised dietary changes, exercise and asked him to stop his fish oil as his TGs are excellent but his LDL needs some work. Will recheck in 6 months. No indication for statin / ASA currently.
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
## 2017-09-26 NOTE — Assessment & Plan Note (Signed)
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 RPR 1 m ago was negative. Will screen for oral/rectal GC/C today. Urine test was negative 1 m prior; has no active symptoms of infection.

## 2017-09-27 LAB — TESTOSTERONE TOTAL,FREE,BIO, MALES
Albumin: 4.4 g/dL (ref 3.6–5.1)
SEX HORMONE BINDING: 46 nmol/L (ref 10–50)
TESTOSTERONE FREE: 72.5 pg/mL (ref 46.0–224.0)
Testosterone, Bioavailable: 146 ng/dL (ref >=110.0)
Testosterone: 679 ng/dL (ref 250–827)

## 2017-09-27 LAB — CYTOLOGY, (ORAL, ANAL, URETHRAL) ANCILLARY ONLY
Chlamydia: POSITIVE — AB
Neisseria Gonorrhea: POSITIVE — AB

## 2017-09-30 ENCOUNTER — Telehealth: Payer: Self-pay | Admitting: *Deleted

## 2017-09-30 LAB — CYTOLOGY, (ORAL, ANAL, URETHRAL) ANCILLARY ONLY
CHLAMYDIA, DNA PROBE: NEGATIVE
NEISSERIA GONORRHEA: NEGATIVE

## 2017-09-30 NOTE — Telephone Encounter (Signed)
-----   Message from Blanchard KelchStephanie N Dixon, NP sent at 09/27/2017  3:43 PM EST ----- Patient with chlamydia and gonorrhea from rectal swab in clinic this week. Please call and have him come in for treatment with :   1. 1 g Azithromycin PO  2. 250 mg Rocephin IM x 1   Needs to inform partner(s) so they may have testing/treatment at [facility]. Please also inform him that his testosterone levels were within the normal range.   Thank you.

## 2017-09-30 NOTE — Telephone Encounter (Signed)
Called patient and left a voice mail asking that he call nurse triage as soon as possible for his lab results. Wendall MolaJacqueline Cockerham CMA

## 2017-10-01 ENCOUNTER — Encounter: Payer: Self-pay | Admitting: Infectious Diseases

## 2017-10-01 NOTE — Telephone Encounter (Signed)
Sent him a MyChart message also requesting call back to make an appointment for treatment.

## 2017-10-01 NOTE — Telephone Encounter (Signed)
Left voice mail x 2 for patient to call [facility] nurse triage. Wendall MolaJacqueline Cockerham CMA

## 2017-10-02 ENCOUNTER — Ambulatory Visit (INDEPENDENT_AMBULATORY_CARE_PROVIDER_SITE_OTHER): Payer: BLUE CROSS/BLUE SHIELD | Admitting: *Deleted

## 2017-10-02 DIAGNOSIS — Z113 Encounter for screening for infections with a predominantly sexual mode of transmission: Secondary | ICD-10-CM

## 2017-10-02 MED ORDER — AZITHROMYCIN 250 MG PO TABS
1000.0000 mg | ORAL_TABLET | Freq: Once | ORAL | Status: AC
  Administered 2017-10-02: 1000 mg via ORAL

## 2017-10-02 MED ORDER — CEFTRIAXONE SODIUM 250 MG IJ SOLR
250.0000 mg | Freq: Once | INTRAMUSCULAR | Status: AC
  Administered 2017-10-02: 250 mg via INTRAMUSCULAR

## 2017-10-14 ENCOUNTER — Other Ambulatory Visit: Payer: Self-pay | Admitting: Infectious Diseases

## 2017-10-14 MED ORDER — SILDENAFIL CITRATE 25 MG PO TABS: 25.0000 mg | ORAL_TABLET | Freq: Every day | ORAL | 2 refills | Status: DC | PRN

## 2017-10-17 ENCOUNTER — Other Ambulatory Visit: Payer: Self-pay | Admitting: Internal Medicine

## 2017-10-17 DIAGNOSIS — B20 Human immunodeficiency virus [HIV] disease: Secondary | ICD-10-CM

## 2017-10-24 ENCOUNTER — Other Ambulatory Visit (INDEPENDENT_AMBULATORY_CARE_PROVIDER_SITE_OTHER): Payer: BLUE CROSS/BLUE SHIELD

## 2017-10-24 DIAGNOSIS — E785 Hyperlipidemia, unspecified: Secondary | ICD-10-CM

## 2017-10-24 DIAGNOSIS — B20 Human immunodeficiency virus [HIV] disease: Secondary | ICD-10-CM | POA: Diagnosis not present

## 2017-10-24 DIAGNOSIS — Z21 Asymptomatic human immunodeficiency virus [HIV] infection status: Secondary | ICD-10-CM

## 2017-10-24 LAB — CHOLESTEROL, TOTAL: Cholesterol: 279 mg/dL — ABNORMAL HIGH (ref <200)

## 2017-10-24 MED ORDER — CEFTRIAXONE SODIUM 250 MG IJ SOLR
250.0000 mg | Freq: Once | INTRAMUSCULAR | Status: AC
  Administered 2017-10-24: 250 mg via INTRAMUSCULAR

## 2017-10-24 MED ORDER — AZITHROMYCIN 250 MG PO TABS
1000.0000 mg | ORAL_TABLET | Freq: Once | ORAL | Status: AC
  Administered 2017-10-24: 1000 mg via ORAL

## 2017-10-28 LAB — HIV-1 RNA QUANT-NO REFLEX-BLD
HIV 1 RNA Quant: 27 copies/mL — ABNORMAL HIGH
HIV-1 RNA Quant, Log: 1.43 Log copies/mL — ABNORMAL HIGH

## 2018-01-31 ENCOUNTER — Ambulatory Visit (HOSPITAL_COMMUNITY)
Admission: EM | Admit: 2018-01-31 | Discharge: 2018-01-31 | Disposition: A | Discharge status: Home / Self Care | Payer: BLUE CROSS/BLUE SHIELD | Attending: Emergency Medicine | Admitting: Emergency Medicine

## 2018-01-31 ENCOUNTER — Encounter (HOSPITAL_COMMUNITY): Payer: Self-pay | Admitting: Emergency Medicine

## 2018-01-31 DIAGNOSIS — H9202 Otalgia, left ear: Secondary | ICD-10-CM | POA: Diagnosis present

## 2018-01-31 DIAGNOSIS — B20 Human immunodeficiency virus [HIV] disease: Secondary | ICD-10-CM | POA: Diagnosis not present

## 2018-01-31 DIAGNOSIS — Z79899 Other long term (current) drug therapy: Secondary | ICD-10-CM | POA: Diagnosis not present

## 2018-01-31 DIAGNOSIS — J029 Acute pharyngitis, unspecified: Secondary | ICD-10-CM | POA: Diagnosis present

## 2018-01-31 DIAGNOSIS — R51 Headache: Secondary | ICD-10-CM | POA: Diagnosis not present

## 2018-01-31 DIAGNOSIS — Z87891 Personal history of nicotine dependence: Secondary | ICD-10-CM | POA: Diagnosis not present

## 2018-01-31 DIAGNOSIS — R6889 Other general symptoms and signs: Secondary | ICD-10-CM

## 2018-01-31 LAB — POCT RAPID STREP A: Streptococcus, Group A Screen (Direct): NEGATIVE

## 2018-01-31 MED ORDER — IBUPROFEN 600 MG PO TABS: 600.0000 mg | ORAL_TABLET | Freq: Four times a day (QID) | ORAL | 0 refills | Status: DC | PRN

## 2018-01-31 MED ORDER — FLUTICASONE PROPIONATE 50 MCG/ACT NA SUSP: 2.0000 | Freq: Every day | NASAL | 0 refills | Status: DC

## 2018-01-31 MED ORDER — OSELTAMIVIR PHOSPHATE 75 MG PO CAPS: 75.0000 mg | ORAL_CAPSULE | Freq: Two times a day (BID) | ORAL | 0 refills | Status: DC

## 2018-01-31 NOTE — ED Triage Notes (Signed)
Pt states "last night I had an ear ache on the left, I woke up a couple times freezing with chills. I have really bad body aches and a really bad headache, I threw up, my throat hurt".

## 2018-01-31 NOTE — ED Provider Notes (Signed)
HPI  SUBJECTIVE:  Frank Webster is a 42 y.o. male who presents with acute onset of headache, body aches, sore throat, left-sided ear pain last night.  States that both of his ears hurt today.  He reports an episode of dry heaving secondary to his headache.  He reports some photophobia, denies rash, neck stiffness.  He also reports some sinus pain and pressure.  He took ibuprofen and slept, with significant improvement in his symptoms.  His last dose of ibuprofen was within 6-8 hours prior to evaluation.  Symptoms are worse with exposure to light, talking.  He reports some nasal congestion.  No fevers, but he took his temperature several hours after starting the ibuprofen.  No rhinorrhea, postnasal drip, cough.  No difficulty breathing.  No voice changes no drooling, trismus, abdominal pain.  No diarrhea.  He is tolerating p.o. today.  He states that he cleaned up some raw sewage yesterday, and is wondering if his symptoms have anything to do with that.  He thinks he had a flu shot.  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  He has a past medical history of HIV, CD4 count is 700-800, viral load undetectable, meningitis.  No history of diabetes, hypertension, migraines.  BJY:NWGNFA, Tama Headings, FNP Infectious disease: Dr. Luciana Axe.    Past Medical History:  Diagnosis Date  . Depression   . HIV infection (HCC)   . 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 TESTICLE TORSION REDUCTION      Family History  Problem Relation Age of Onset  . Cancer Maternal Grandmother   . Arthritis Neg Hx   . Lupus Neg Hx   . Rosacea Neg Hx     Social History   Tobacco Use  . Smoking status: Former Smoker    Packs/day: 0.75    Years: 25.00    Pack years: 18.75    Types: Cigarettes    Last attempt to quit: 03/04/2016    Years since quitting: 1.9  . Smokeless tobacco: Never Used  . Tobacco comment: wants to  discuss options today  Substance Use Topics  . Alcohol use: No    Alcohol/week: 0.0 oz    Comment: in recovery  . Drug use: No    Comment: in recovery    No current facility-administered medications for this encounter.   Current Outpatient Medications:  .  doxazosin (CARDURA) 4 MG tablet, , Disp: , Rfl: 2 .  GENVOYA 150-150-200-10 MG TABS tablet, TAKE 1 TABLET BY MOUTH EVERY DAY WITH BREAKFAST, Disp: 30 tablet, Rfl: 5 .  lamoTRIgine (LAMICTAL) 150 MG tablet, TK 1 T PO QD, Disp: , Rfl: 0 .  OXcarbazepine (TRILEPTAL) 150 MG tablet, TK 1 T PO QAM AND 2 TS PO QHS, Disp: , Rfl: 1 .  TRAZODONE HCL PO, Take by mouth., Disp: , Rfl:  .  VIIBRYD 20 MG TABS, TAKE 1 TABLET(20 MG) BY MOUTH DAILY, Disp: 30 tablet, Rfl: 2 .  cholecalciferol (VITAMIN D) 1000 UNITS tablet, Take 2,000 Units by mouth daily. , Disp: , Rfl:  .  fluticasone (FLONASE) 50 MCG/ACT nasal spray, Place 2 sprays into both nostrils daily., Disp: 16 g, Rfl: 0 .  folic acid (FOLVITE) 800 MCG tablet, Take 1,200 mcg by mouth daily., Disp: , Rfl:  .  glucosamine-chondroitin 500-400 MG tablet, Take 1 tablet by mouth 3 (three) times daily., Disp: ,  Rfl:  .  ibuprofen (ADVIL,MOTRIN) 600 MG tablet, Take 1 tablet (600 mg total) by mouth every 6 (six) hours as needed., Disp: 30 tablet, Rfl: 0 .  Melatonin 1 MG TABS, Take by mouth., Disp: , Rfl:  .  NON FORMULARY, Tumeric, Disp: , Rfl:  .  oseltamivir (TAMIFLU) 75 MG capsule, Take 1 capsule (75 mg total) by mouth 2 (two) times daily. X 5 days, Disp: 10 capsule, Rfl: 0 .  sildenafil (VIAGRA) 25 MG tablet, Take 1-2 tablets (25-50 mg total) by mouth daily as needed for erectile dysfunction., Disp: 10 tablet, Rfl: 2 .  vitamin C (ASCORBIC ACID) 250 MG tablet, Take 250 mg by mouth daily., Disp: , Rfl:   No Known Allergies   ROS  As noted in HPI.   Physical Exam  BP (!) 102/58   Pulse 76   Temp 98.6 F (37 C)   Resp 18   SpO2 100%   Constitutional: Well developed, well nourished, no  acute distress Eyes: PERRL, EOMI, conjunctiva normal bilaterally HENT: Normocephalic, atraumatic,mucus membranes moist.  TMs normal bilaterally.  No appreciable sinus tenderness.  Positive mild nasal congestion.  Erythematous, swollen turbinates.  Normal tonsils.  No exudates.  Uvula midline.  Positive cobblestoning, no postnasal drip.  Neck: No cervical lymphadenopathy, meningismus Respiratory: Clear to auscultation bilaterally, no rales, no wheezing, no rhonchi Cardiovascular: Normal rate and rhythm, no murmurs, no gallops, no rubs GI: Soft, nondistended, normal bowel sounds, nontender, no rebound, no guarding Back: no CVAT skin: No rash, skin intact Musculoskeletal: No trapezial tenderness.  Positive mild rhomboid tenderness and low back tenderness.  No edema, no tenderness, no deformities Neurologic: Alert & oriented x 3, CN II-XII grossly intact, no motor deficits, sensation grossly intact Psychiatric: Speech and behavior appropriate   ED Course   Medications - No data to display  Orders Placed This Encounter  Procedures  . Culture, group A strep    Standing Status:   Standing    Number of Occurrences:   1  . POCT rapid strep A [facility])    Standing Status:   Standing    Number of Occurrences:   1   Results for orders placed or performed during the hospital encounter of 01/31/18 (from the past 24 hour(s))  POCT rapid strep A [facility])     Status: None   Collection Time: 01/31/18  5:41 PM  Result Value Ref Range   Streptococcus, Group A Screen (Direct) NEGATIVE NEGATIVE   No results found.  ED Clinical Impression  Flu-like symptoms   ED Assessment/Plan  Presentation consistent with influenza-like illness.  He has no documented fever, but he also took his temperature after taking ibuprofen.  There is no evidence of meningitis, otitis, pharyngitis, sinusitis, doubt pneumonia, intra-abdominal process.  He may have picked up something from the sewage that he  cleaned up yesterday but he has no GI symptoms.  We will check a rapid strep because of the report of sore throat, but will treat as an influenza and will send home with ibuprofen 600 mg take with 1 g of Tylenol 3 or 4 times a day as needed for headache, Tamiflu, Flonase, rest, push electrolyte containing fluids such as Pedialyte.  Will give him a school note for 3 days.  Follow-up with PMD as needed.  Discussed labs, imaging, MDM, treatment plan, and plan for follow-up with patient Discussed sn/sx that should prompt return to the ED. patient agrees with plan.   Meds ordered  this encounter  Medications  . fluticasone (FLONASE) 50 MCG/ACT nasal spray    Sig: Place 2 sprays into both nostrils daily.    Dispense:  16 g    Refill:  0  . ibuprofen (ADVIL,MOTRIN) 600 MG tablet    Sig: Take 1 tablet (600 mg total) by mouth every 6 (six) hours as needed.    Dispense:  30 tablet    Refill:  0  . oseltamivir (TAMIFLU) 75 MG capsule    Sig: Take 1 capsule (75 mg total) by mouth 2 (two) times daily. X 5 days    Dispense:  10 capsule    Refill:  0    *This clinic note was created using Scientist, clinical (histocompatibility and immunogenetics). Therefore, there may be occasional mistakes despite careful proofreading.  ?   Domenick Gong, MD 02/01/18 1728

## 2018-01-31 NOTE — Discharge Instructions (Addendum)
ibuprofen 600 mg take with 1 g of Tylenol 3 or 4 times a day as needed for headache and bodyaches, Tamiflu, Flonase, rest, push electrolyte containing fluids such as Pedialyte.

## 2018-01-31 NOTE — ED Triage Notes (Signed)
Pt states yesterday his toilet overflowed with sewage water and cleaned it up, worried he was contaminated with something.

## 2018-02-03 LAB — CULTURE, GROUP A STREP (THRC)

## 2018-03-14 ENCOUNTER — Other Ambulatory Visit: Payer: Self-pay | Admitting: *Deleted

## 2018-03-14 DIAGNOSIS — B20 Human immunodeficiency virus [HIV] disease: Secondary | ICD-10-CM

## 2018-03-14 MED ORDER — ELVITEG-COBIC-EMTRICIT-TENOFAF 150-150-200-10 MG PO TABS: ORAL_TABLET | ORAL | 2 refills | Status: DC

## 2018-03-18 ENCOUNTER — Other Ambulatory Visit: Payer: Self-pay | Admitting: *Deleted

## 2018-03-18 DIAGNOSIS — B20 Human immunodeficiency virus [HIV] disease: Secondary | ICD-10-CM

## 2018-03-24 ENCOUNTER — Ambulatory Visit (INDEPENDENT_AMBULATORY_CARE_PROVIDER_SITE_OTHER): Payer: BC Managed Care – PPO | Admitting: Pharmacist

## 2018-03-24 ENCOUNTER — Other Ambulatory Visit: Payer: BC Managed Care – PPO

## 2018-03-24 DIAGNOSIS — B20 Human immunodeficiency virus [HIV] disease: Secondary | ICD-10-CM | POA: Diagnosis not present

## 2018-03-24 LAB — CBC WITH DIFFERENTIAL/PLATELET
BASOS PCT: 0.4 %
Basophils Absolute: 20 cells/uL (ref 0–200)
EOS ABS: 59 {cells}/uL (ref 15–500)
Eosinophils Relative: 1.2 %
HCT: 40.5 % (ref 38.5–50.0)
HEMOGLOBIN: 13.9 g/dL (ref 13.2–17.1)
Lymphs Abs: 2465 cells/uL (ref 850–3900)
MCH: 30.8 pg (ref 27.0–33.0)
MCHC: 34.3 g/dL (ref 32.0–36.0)
MCV: 89.8 fL (ref 80.0–100.0)
MPV: 9.8 fL (ref 7.5–12.5)
Monocytes Relative: 10.5 %
NEUTROS ABS: 1842 {cells}/uL (ref 1500–7800)
Neutrophils Relative %: 37.6 %
Platelets: 279 10*3/uL (ref 140–400)
RBC: 4.51 10*6/uL (ref 4.20–5.80)
RDW: 12.8 % (ref 11.0–15.0)
Total Lymphocyte: 50.3 %
WBC mixed population: 515 cells/uL (ref 200–950)
WBC: 4.9 10*3/uL (ref 3.8–10.8)

## 2018-03-24 LAB — COMPLETE METABOLIC PANEL WITH GFR
AG RATIO: 1.6 (calc) (ref 1.0–2.5)
ALKALINE PHOSPHATASE (APISO): 55 U/L (ref 40–115)
ALT: 28 U/L (ref 9–46)
AST: 19 U/L (ref 10–40)
Albumin: 4.2 g/dL (ref 3.6–5.1)
BUN: 12 mg/dL (ref 7–25)
CO2: 27 mmol/L (ref 20–32)
CREATININE: 0.76 mg/dL (ref 0.60–1.35)
Calcium: 9.5 mg/dL (ref 8.6–10.3)
Chloride: 101 mmol/L (ref 98–110)
GFR, EST NON AFRICAN AMERICAN: 111 mL/min/{1.73_m2} (ref >=60)
GFR, Est African American: 129 mL/min/{1.73_m2} (ref >=60)
Globulin: 2.6 g/dL (calc) (ref 1.9–3.7)
Glucose, Bld: 101 mg/dL — ABNORMAL HIGH (ref 65–99)
POTASSIUM: 4.1 mmol/L (ref 3.5–5.3)
Sodium: 136 mmol/L (ref 135–146)
Total Bilirubin: 0.4 mg/dL (ref 0.2–1.2)
Total Protein: 6.8 g/dL (ref 6.1–8.1)

## 2018-03-24 MED ORDER — ABACAVIR-DOLUTEGRAVIR-LAMIVUD 600-50-300 MG PO TABS: 1.0000 | ORAL_TABLET | Freq: Every morning | ORAL | 11 refills | Status: DC

## 2018-03-24 MED ORDER — DOLUTEGRAVIR SODIUM 50 MG PO TABS: 50.0000 mg | ORAL_TABLET | Freq: Every day | ORAL | 11 refills | Status: DC

## 2018-03-24 NOTE — Progress Notes (Signed)
HPI: Frank Webster is a 42 y.o. male who presents to the [facility] clinic today to get labs but asked to speak to someone about his medications while he was here.  Patient Active Problem List   Diagnosis Date Noted  . Erectile dysfunction 09/26/2017  . STD exposure 09/26/2017  . Anxiety state 04/05/2016  . Decreased libido 04/05/2016  . Testicular/scrotal pain 04/21/2015  . Racing heart beat 04/21/2015  . Hyperlipidemia 04/21/2015  . Nonallopathic lesion of thoracic region 04/14/2015  . Scapular dyskinesis 03/24/2015  . Sprain of acromioclavicular joint 03/17/2015  . Routine general medical examination at a health care facility 02/15/2015  . Screening examination for venereal disease 09/30/2014  . Encounter for long-term (current) use of medications 09/30/2014  . Seasonal allergies 01/27/2014  . Hemorrhoid 09/29/2012  . Depression, major ([place]) 10/31/2010  . DERMATOPHYTOSIS OF UNSPECIFIED SITE 10/24/2010  . INFLAMED SEBORRHEIC KERATOSIS 10/11/2010  . 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 Human immunodeficiency virus (HIV) disease ([place]) 02/23/2010    Patient's Medications  New Prescriptions   ABACAVIR-DOLUTEGRAVIR-LAMIVUDINE (TRIUMEQ) 600-50-300 MG TABLET    Take 1 tablet by mouth every morning.   DOLUTEGRAVIR (TIVICAY) 50 MG TABLET    Take 1 tablet (50 mg total) by mouth at bedtime.  Previous Medications   CHOLECALCIFEROL (VITAMIN D) 1000 UNITS TABLET    Take 2,000 Units by mouth daily.    DOXAZOSIN (CARDURA) 4 MG TABLET       FLUTICASONE (FLONASE) 50 MCG/ACT NASAL SPRAY    Place 2 sprays into both nostrils daily.   FOLIC ACID (FOLVITE) 818 MCG TABLET    Take 1,200 mcg by mouth daily.   GLUCOSAMINE-CHONDROITIN 500-400 MG TABLET    Take 1 tablet by mouth 3 (three) times daily.   IBUPROFEN (ADVIL,MOTRIN) 600 MG TABLET    Take 1 tablet (600 mg total) by mouth every 6 (six) hours as needed.   LAMOTRIGINE (LAMICTAL) 150 MG TABLET    TK 1 T PO QD   MELATONIN 1 MG TABS    Take by mouth.   NON FORMULARY    Tumeric   OSELTAMIVIR (TAMIFLU) 75 MG CAPSULE    Take 1 capsule (75 mg total) by mouth 2 (two) times daily. X 5 days   OXCARBAZEPINE (TRILEPTAL) 150 MG TABLET    TK 1 T PO QAM AND 2 TS PO QHS   SILDENAFIL (VIAGRA) 25 MG TABLET    Take 1-2 tablets (25-50 mg total) by mouth daily as needed for erectile dysfunction.   TRAZODONE HCL PO    Take by mouth.   VIIBRYD 20 MG TABS    TAKE 1 TABLET(20 MG) BY MOUTH DAILY   VITAMIN C (ASCORBIC ACID) 250 MG TABLET    Take 250 mg by mouth daily.  Modified Medications   No medications on file  Discontinued Medications   ELVITEGRAVIR-COBICISTAT-EMTRICITABINE-TENOFOVIR (GENVOYA) 150-150-200-10 MG TABS TABLET    TAKE 1 TABLET BY MOUTH EVERY DAY WITH BREAKFAST    Allergies: No Known Allergies  Past Medical History: Past Medical History:  Diagnosis Date  . Depression   . HIV infection ([place])   . 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 Marital status: Single    Spouse name: Not on file  . Number of children: 0  . Years of education: 64  . Highest education level: Not on file  Occupational History  . Occupation: Risk analyst  . Financial resource strain: Not on  file  . Food insecurity:    Worry: Not on file    Inability: Not on file  . Transportation needs:    Medical: Not on file    Non-medical: Not on file  Tobacco Use  . Smoking status: Former Smoker    Packs/day: 0.75    Years: 25.00    Pack years: 18.75    Types: Cigarettes    Last attempt to quit: 03/04/2016    Years since quitting: 2.0  . Smokeless tobacco: Never Used  . Tobacco comment: wants to discuss options today  Substance and Sexual Activity  . Alcohol use: No    Alcohol/week: 0.0 oz    Comment: in recovery  . 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 Sexual activity: Yes    Partners: Male    Birth control/protection: Condom  Lifestyle  . Physical activity:    Days per week: Not on file     Minutes per session: Not on file  . Stress: Not on file  Relationships  . Social connections:    Talks on phone: Not on file    Gets together: Not on file    Attends religious service: Not on file    Active member of club or organization: Not on file    Attends meetings of clubs or organizations: Not on file    Relationship status: Not on file  Other Topics Concern  . Not on file  Social History Narrative   Fun: Yoga, hiking, going to the gym.     Labs: Lab Results  Component Value Date   HIV1RNAQUANT 27 (H) 10/24/2017   HIV1RNAQUANT 46 (H) 08/22/2017   HIV1RNAQUANT 88 (H) 11/27/2016   CD4TABS 1,030 08/22/2017   CD4TABS 900 11/27/2016   CD4TABS 720 05/29/2016    RPR and STI Lab Results  Component Value Date   LABRPR NON-REACTIVE 08/22/2017   LABRPR NON REAC 05/29/2016   LABRPR NON REAC 03/08/2015   LABRPR NON REAC 12/16/2013   LABRPR NON REAC 03/06/2012    STI Results GC CT  09/26/2017 Negative Negative  09/26/2017 **POSITIVE**(A) **POSITIVE**(A)  08/22/2017 Negative Negative    Hepatitis B Lab Results  Component Value Date   HEPBSAB INDETER (A) 02/24/2010   HEPBSAG NEG 02/24/2010   HEPBCAB NEG 02/24/2010   Hepatitis C No results found for: HEPCAB, HCVRNAPCRQN Hepatitis A Lab Results  Component Value Date   HAV NEG 02/24/2010   Lipids: Lab Results  Component Value Date   CHOL 279 (H) 10/24/2017   TRIG 80 08/22/2017   HDL 43 08/22/2017   CHOLHDL 5.3 (H) 08/22/2017   VLDL 20 05/29/2016   LDLCALC 167 (H) 08/22/2017    Current HIV Regimen: Genvoya  Assessment: Costantino is here today to get labs before his upcoming appointment at the end of the month.  He asked to speak to someone about his medications and side effects. Sharyn Lull, one of our RNs, noticed his medication list and asked me to step in.  Patient has had HIV for many years. He is currently taking Genvoya.  He is also taking Flonase and Trileptal, both of which are contraindicated with  Genvoya. The Flonase interacts with the cobicistat and the Trileptal interacts with TAF in the [place]. Trileptal is a big issue with basically all ARTs as it is a major inducer and causes increased metabolism of many drugs (TAF specifically), which in turn could cause Genvoya failure.   Since most of our one pill one daily regimens have  TAF in them, our options are really limited to what we can change him to.  He cannot be on [place], [place], [place], or [place].  The Trileptal also interactions with dolutegravir, but to a lesser extent and this interaction can be overcome by administering dolutegravir twice daily.  He is HLA negative, so will change him to Triumeq in the AM and Tivicay in the PM so that he is getting dolutegravir BID. He has had low level viremia for the last couple of lab draws, and I cant help but wonder if that is why? He last saw Dr. Linus Salmons in February 2018 and was not started on Trileptal yet.  He stated he did ask his psychologist about drug interactions and he told him that there were none.    Patient is on board with plan and happy with the changes.  He states he has absolutely no issues taking something twice daily. He has no resistance that I could find.  He saw Colletta [place] back in December and will follow-up with her in 2 weeks. I told him we would recheck labs on the new regimen.  Plan: - Stop Genvoya - Start Triumeq PO once daily in the AM - Start Tivicay 50 mg PO daily in the PM - F/u with Stephaine 6/24 at [facility]. Dennie Vecchio, PharmD, [place], [place] for Infectious Disease 03/24/2018, 1:33 PM

## 2018-03-25 LAB — T-HELPER CELL (CD4) - (RCID CLINIC ONLY)
CD4 % Helper T Cell: 42 % (ref 33–55)
CD4 T Cell Abs: 980 /uL (ref 400–2700)

## 2018-03-26 LAB — HIV-1 RNA QUANT-NO REFLEX-BLD
HIV 1 RNA Quant: <20 copies/mL — AB
HIV-1 RNA QUANT, LOG: DETECTED {Log_copies}/mL — AB

## 2018-04-07 ENCOUNTER — Ambulatory Visit (INDEPENDENT_AMBULATORY_CARE_PROVIDER_SITE_OTHER): Payer: BC Managed Care – PPO | Admitting: Infectious Diseases

## 2018-04-07 ENCOUNTER — Encounter: Payer: Self-pay | Admitting: Infectious Diseases

## 2018-04-07 VITALS — BP 115/66 | HR 70 | Temp 98.0°F | Resp 16 | Ht 68.0 in | Wt 150.0 lb

## 2018-04-07 DIAGNOSIS — Z0000 Encounter for general adult medical examination without abnormal findings: Secondary | ICD-10-CM | POA: Diagnosis not present

## 2018-04-07 DIAGNOSIS — Z23 Encounter for immunization: Secondary | ICD-10-CM

## 2018-04-07 DIAGNOSIS — B20 Human immunodeficiency virus [HIV] disease: Secondary | ICD-10-CM

## 2018-04-07 NOTE — Assessment & Plan Note (Signed)
Prevnar vaccine today. Will have him return in 3 months and get Pneumovax booster with his lab visit. This will complete his pneumonia vaccine series until he needs pneumovax @ 42yo.  Last Hep C screening was 2015 (after he stopped injection drug use).

## 2018-04-07 NOTE — Progress Notes (Signed)
Name: Frank Webster Lascano  DOB: 03/08/1976  MRN: [id number]  PCP: Veryl Speakalone, Gregory D, FNP   Reason for Visit: Routine HIV Care  Brief Narrative: Frank Webster is a 42 y.o. male with HIV disease. Has seen Dr. Luciana Axeomer in the clinic and previously well controlled with low level viremia < 100 copies the last few draws. Diagnosed 22 years ago; had a time where he struggled with adherence 2/2 drug use. OI Hx: none. HIV Risk: MSM, previous drug use.   Previous Regimens:   Genvoya   Triumeq + Tivicay   Genotype:   01/2014 - no INSTI resistance, wild type   Patient Active Problem List   Diagnosis Date Noted  . Need for vaccination with 13-polyvalent pneumococcal conjugate vaccine 04/07/2018  . Erectile dysfunction 09/26/2017  . STD exposure 09/26/2017  . Anxiety state 04/05/2016  . Decreased libido 04/05/2016  . Testicular/scrotal pain 04/21/2015  . Hyperlipidemia 04/21/2015  . Nonallopathic lesion of thoracic region 04/14/2015  . Scapular dyskinesis 03/24/2015  . Sprain of acromioclavicular joint 03/17/2015  . Health care maintenance 02/15/2015  . Screening examination for venereal disease 09/30/2014  . Encounter for long-term (current) use of medications 09/30/2014  . Seasonal allergies 01/27/2014  . Hemorrhoid 09/29/2012  . Depression, major (HCC) 10/31/2010  . DERMATOPHYTOSIS OF UNSPECIFIED SITE 10/24/2010  . INFLAMED SEBORRHEIC KERATOSIS 10/11/2010  . 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 Human immunodeficiency virus (HIV) disease (HCC) 02/23/2010    HPI:  Frank Webster is here today for routine follow up care of his HIV disease. Interval history noted for medication interaction between Trileptal and dolutegravir component. We have recommended adding PM dose of dolutegravir due to this interaction to achieve an effective dose. He has had no trouble with the medication switch and has been adherent with the new regimen. Wondering if he needs more considering his Trileptal was increased to 600 mg.  Denies complaints today suggestive of associated opportunistic infection or advancing HIV disease such as fevers, night sweats, weight loss, anorexia, cough, SOB, nausea, vomiting, diarrhea, headache, sensory changes, lymphadenopathy or oral thrush. Has not had prevnar vaccine yet.   He has been struggling with some mental health/depression symptoms that have worsened acutely since completing graduate school, moving into a new home and having to put his cat down. Associated symptoms include crying episodes, insomnia, withdrawn from activities and lack of motivation. Denies SI/HI. Working with mental health providers now for adjustment.   Has been free from IVDU for over 5 years now. 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   Review of Systems  Constitutional: Negative for chills, fever, malaise/fatigue and weight loss.  HENT: Negative for sore throat.        No dental problems  Respiratory: Negative for cough and sputum production.   Cardiovascular: Negative for chest pain and leg swelling.  Gastrointestinal: Negative for abdominal pain, diarrhea and vomiting.  Genitourinary: Negative for dysuria and flank pain.  Musculoskeletal: Negative for joint pain, myalgias and neck pain.  Skin: Negative for rash.  Neurological: Negative for dizziness, tingling and headaches.  Psychiatric/Behavioral: Positive for depression. Negative for substance abuse and suicidal ideas. The patient has insomnia. The patient is not nervous/anxious.     Past Medical History:  Diagnosis Date  . Depression   . HIV infection (HCC)   . Substance abuse in remission (HCC)    No Known Allergies  Social History   Tobacco Use  . Smoking status: Former Smoker    Packs/day: 0.75    Years: 25.00  Pack years: 18.75    Types: Cigarettes    Last attempt to quit: 03/04/2016    Years since quitting: 2.0  . Smokeless tobacco: Never Used  . Tobacco comment: wants to discuss options today    Substance Use Topics  . Alcohol use: No    Alcohol/week: 0.0 oz    Comment: in recovery  . Drug use: No    Comment: in recovery   Social History   Substance and Sexual Activity  Sexual Activity Yes  . Partners: Male  . Birth control/protection: Condom   Physical Exam and Objective Findings:  Vitals:   04/07/18 1104  BP: 115/66  Pulse: 70  Resp: 16  Temp: 98 F (36.7 C)  TempSrc: Oral  SpO2: 98%  Weight: 150 lb (68 kg)  Height: 5\' 8"  (1.727 m)   Body mass index is 22.81 kg/m.  Physical Exam  Constitutional: He is oriented to person, place, and time and well-developed, well-nourished, and in no distress.  Seated comfortably in the chair. Good spirits today.   HENT:  Mouth/Throat: Oropharynx is clear and moist. No oral lesions. Normal dentition. No dental caries.  Eyes: No scleral icterus.  Cardiovascular: Normal rate, regular rhythm and normal heart sounds.  Pulmonary/Chest: Effort normal and breath sounds normal.  Abdominal: Soft. He exhibits no distension. There is no tenderness.  Lymphadenopathy:    He has no cervical adenopathy.  Neurological: He is alert and oriented to person, place, and time.  Skin: Skin is warm and dry. No rash noted.  Psychiatric: Mood and affect normal.   Lab Results Lab Results  Component Value Date   WBC 4.9 03/24/2018   HGB 13.9 03/24/2018   HCT 40.5 03/24/2018   MCV 89.8 03/24/2018   PLT 279 03/24/2018    Lab Results  Component Value Date   CREATININE 0.76 03/24/2018   BUN 12 03/24/2018   NA 136 03/24/2018   K 4.1 03/24/2018   CL 101 03/24/2018   CO2 27 03/24/2018    Lab Results  Component Value Date   ALT 28 03/24/2018   AST 19 03/24/2018   ALKPHOS 70 05/29/2016   BILITOT 0.4 03/24/2018    Lab Results  Component Value Date   CHOL 279 (H) 10/24/2017   HDL 43 08/22/2017   LDLCALC 167 (H) 08/22/2017   TRIG 80 08/22/2017   CHOLHDL 5.3 (H) 08/22/2017   HIV 1 RNA Quant (copies/mL)  Date Value  03/24/2018 <20  DETECTED (A)  10/24/2017 27 (H)  08/22/2017 46 (H)   CD4 T Cell Abs (/uL)  Date Value  03/24/2018 980  08/22/2017 1,030  11/27/2016 900   Lab Results  Component Value Date   HAV NEG 02/24/2010   Lab Results  Component Value Date   HEPBSAG NEG 02/24/2010   HEPBSAB INDETER (A) 02/24/2010   Lab Results  Component Value Date   HCVAB NEGATIVE 09/14/2014   Lab Results  Component Value Date   CHLAMYDIAWP **POSITIVE** (A) 09/26/2017   CHLAMYDIAWP Negative 09/26/2017   N **POSITIVE** (A) 09/26/2017   N Negative 09/26/2017     Problem List Items Addressed This Visit      Other   Need for vaccination with 13-polyvalent pneumococcal conjugate vaccine   Human immunodeficiency virus (HIV) disease (HCC) - Primary    He was switched to Triumeq + PM Tivicay after DDI was realized with Trileptal. He interestingly was undetectable this last time prior to med change but we assume he has had low-level viremia due to  this. Will continue Triumeq + PM Tivicay as long as he requires Trileptal. Will have him return in 3 months to recheck his VL for lab appointment only.  No family history of MI or CAD from what he understands.  He can return to see me again in 6 months for routine care including full lab visit/STI screening.       Relevant Orders   HIV 1 RNA quant-no reflex-bld   Health care maintenance    Prevnar vaccine today. Will have him return in 3 months and get Pneumovax booster with his lab visit. This will complete his pneumonia vaccine series until he needs pneumovax @ 42yo.  Last Hep C screening was 2015 (after he stopped injection drug use).         Rexene Alberts, MSN, NP-C [facility] for Infectious Disease [facility] Pager: [phone]  04/07/2018  11:36 AM

## 2018-04-07 NOTE — Patient Instructions (Addendum)
Judeth [email or web address] --> send me your new flyer regarding harm reduction.   Will give you a Prevnar vaccine today. Will also give a booster in 3 months when you  3 months - will have you come by for a lab visit and to get your second pneumonia vaccine booster (then you are done with these vaccines until you ar 63!)  Will have you return to see me again in 6 months for a visit.

## 2018-04-07 NOTE — Assessment & Plan Note (Signed)
He was switched to Triumeq + PM Tivicay after DDI was realized with Trileptal. He interestingly was undetectable this last time prior to med change but we assume he has had low-level viremia due to this. Will continue Triumeq + PM Tivicay as long as he requires Trileptal. Will have him return in 3 months to recheck his VL for lab appointment only.  No family history of MI or CAD from what he understands.  He can return to see me again in 6 months for routine care including full lab visit/STI screening.

## 2018-04-18 ENCOUNTER — Other Ambulatory Visit: Payer: Self-pay | Admitting: *Deleted

## 2018-04-18 DIAGNOSIS — B20 Human immunodeficiency virus [HIV] disease: Secondary | ICD-10-CM

## 2018-04-18 MED ORDER — DOLUTEGRAVIR SODIUM 50 MG PO TABS: 50.0000 mg | ORAL_TABLET | Freq: Every day | ORAL | 11 refills | Status: DC

## 2018-04-18 MED ORDER — ABACAVIR-DOLUTEGRAVIR-LAMIVUD 600-50-300 MG PO TABS: 1.0000 | ORAL_TABLET | Freq: Every morning | ORAL | 11 refills | Status: DC

## 2018-04-18 NOTE — Progress Notes (Signed)
Patient must use [facility] per insurance. RN sent new prescriptions. Andree CossHowell, Evyn Kooyman M, RN

## 2018-07-07 ENCOUNTER — Ambulatory Visit (INDEPENDENT_AMBULATORY_CARE_PROVIDER_SITE_OTHER): Payer: BC Managed Care – PPO | Admitting: *Deleted

## 2018-07-07 ENCOUNTER — Other Ambulatory Visit: Payer: BC Managed Care – PPO

## 2018-07-07 DIAGNOSIS — Z23 Encounter for immunization: Secondary | ICD-10-CM

## 2018-07-07 DIAGNOSIS — B20 Human immunodeficiency virus [HIV] disease: Secondary | ICD-10-CM

## 2018-07-09 LAB — HIV-1 RNA QUANT-NO REFLEX-BLD
HIV 1 RNA QUANT: 53 {copies}/mL — AB
HIV-1 RNA Quant, Log: 1.72 Log copies/mL — ABNORMAL HIGH

## 2018-08-28 ENCOUNTER — Encounter: Payer: Self-pay | Admitting: Psychiatry

## 2018-08-28 ENCOUNTER — Ambulatory Visit (INDEPENDENT_AMBULATORY_CARE_PROVIDER_SITE_OTHER): Payer: BC Managed Care – PPO | Admitting: Psychiatry

## 2018-08-28 VITALS — BP 92/62 | HR 74

## 2018-08-28 DIAGNOSIS — F4310 Post-traumatic stress disorder, unspecified: Secondary | ICD-10-CM

## 2018-08-28 DIAGNOSIS — F5101 Primary insomnia: Secondary | ICD-10-CM | POA: Diagnosis not present

## 2018-08-28 DIAGNOSIS — F39 Unspecified mood [affective] disorder: Secondary | ICD-10-CM | POA: Diagnosis not present

## 2018-08-28 MED ORDER — SUVOREXANT 20 MG PO TABS: 20.0000 mg | ORAL_TABLET | Freq: Every day | ORAL | 0 refills | Status: AC

## 2018-08-28 MED ORDER — SUVOREXANT 15 MG PO TABS: 15.0000 mg | ORAL_TABLET | Freq: Every day | ORAL | 0 refills | Status: AC

## 2018-08-28 NOTE — Progress Notes (Signed)
Frank Webster [id number] [birth year] 42 y.o.  Subjective:   Frank Webster ID:  Frank Webster is a 42 y.o. (DOB Oct 10, 1976) male.  Chief Complaint:  Chief Complaint  Frank Webster presents with  . Sleeping Problem    HPI Frank Webster presents to the office today for follow-up of insomnia, depression, and anxiety. Frank Webster reports that Frank Webster continues to have multiple awakenings a night (at least 5). Denies difficulty with sleep initiation. Sleeping 7-9 hours fragmented. Mood has been stable. Frank Webster reports some occasional anxiety in response to stress. Has had some panic s/s in response to severe stressors. Frank Webster reports that Frank Webster has been able to remain calm in some potentially stressful work situations.  Enjoying activities and interests. Frank Webster reports that energy and motivation is initially low and "once I get going I'm great." Reports concentration is good once Frank Webster gets going with things. Denies impulsive or risky behaviors. Denies mood lability. Denies SI.   Reports that there was no clear benefit with Doxepin. Denies snoring, RLS, nightmares. Reports that Frank Webster has not taken Propranolol prn  Past medication trials: Prozac-vomiting Celexa Cymbalta Viibryd BuSpar Wellbutrin Lamictal Trileptal Trazodone Doxazosin Clonidine Trazodone-excessive somnolence, vivid dreams Doxepin-ineffective for insomnia  Review of Systems:  Review of Systems  Musculoskeletal: Negative for gait problem.       Episodic shoulder pain.   Neurological: Positive for tremors.  Psychiatric/Behavioral:       Please refer to HPI    Medications: I have reviewed the Frank Webster's current medications.  Current Outpatient Medications  Medication Sig Dispense Refill  . abacavir-dolutegravir-lamiVUDine (TRIUMEQ) 600-50-300 MG tablet Take 1 tablet by mouth every morning. 30 tablet 11  . b complex vitamins capsule Take 1 capsule by mouth daily.    . cholecalciferol (VITAMIN D) 1000 UNITS tablet Take 2,000 Units by mouth daily.     .  dolutegravir (TIVICAY) 50 MG tablet Take 1 tablet (50 mg total) by mouth at bedtime. 30 tablet 11  . doxepin (SINEQUAN) 10 MG capsule Take 20 mg by mouth.    . folic acid (FOLVITE) 800 MCG tablet Take 1,200 mcg by mouth daily.    Marland Kitchen glucosamine-chondroitin 500-400 MG tablet Take 1 tablet by mouth 3 (three) times daily.    Marland Kitchen ibuprofen (ADVIL,MOTRIN) 600 MG tablet Take 1 tablet (600 mg total) by mouth every 6 (six) hours as needed. 30 tablet 0  . lamoTRIgine (LAMICTAL) 200 MG tablet TK 1  T PO QD  1  . loratadine (CLARITIN) 10 MG tablet Take 10 mg by mouth daily.    . NON FORMULARY Tumeric    . oxcarbazepine (TRILEPTAL) 600 MG tablet Take 150 mg in the AM and 450 mg in PM  1  . sildenafil (VIAGRA) 25 MG tablet Take 1-2 tablets (25-50 mg total) by mouth daily as needed for erectile dysfunction. 10 tablet 2  . VIIBRYD 20 MG TABS TAKE 1 TABLET(20 MG) BY MOUTH DAILY 30 tablet 2  . vitamin C (ASCORBIC ACID) 250 MG tablet Take 250 mg by mouth daily.    Marland Kitchen doxazosin (CARDURA) 4 MG tablet   2  . fluticasone (FLONASE) 50 MCG/ACT nasal spray Place 2 sprays into both nostrils daily. (Frank Webster not taking: Reported on 08/28/2018) 16 g 0  . Melatonin 1 MG TABS Take by mouth.    . Suvorexant (BELSOMRA) 15 MG TABS Take 15 mg by mouth at bedtime for 10 days. 10 tablet 0  . Suvorexant (BELSOMRA) 20 MG TABS Take 20 mg by mouth at bedtime for 10 days. 10  tablet 0  . TRAZODONE HCL PO Take by mouth.     No current facility-administered medications for this visit.     Medication Side Effects: None  Allergies: No Known Allergies  Past Medical History:  Diagnosis Date  . Depression   . HIV infection (HCC)   . Substance abuse in remission [facility])     Family History  Problem Relation Age of Onset  . Cancer Maternal Grandmother   . Tremor Father   . Eating disorder Sister   . Depression Sister   . Tremor Paternal Uncle   . Tremor Paternal Grandfather   . Eating disorder Sister   . Eating disorder Sister   .  Arthritis Neg Hx   . Lupus Neg Hx   . Rosacea Neg Hx     Social History   Socioeconomic History  . Marital status: Single    Spouse name: Not on file  . Number of children: 0  . Years of education: 23  . Highest education level: Not on file  Occupational History  . Occupation: Academic librarian  . Financial resource strain: Not on file  . Food insecurity:    Worry: Not on file    Inability: Not on file  . Transportation needs:    Medical: Not on file    Non-medical: Not on file  Tobacco Use  . Smoking status: Former Smoker    Packs/day: 0.75    Years: 25.00    Pack years: 18.75    Types: Cigarettes    Last attempt to quit: 03/04/2016    Years since quitting: 2.4  . Smokeless tobacco: Never Used  . Tobacco comment: wants to discuss options today  Substance and Sexual Activity  . 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 Sexual activity: Yes    Partners: Male    Birth control/protection: Condom  Lifestyle  . Physical activity:    Days per week: Not on file    Minutes per session: Not on file  . Stress: Not on file  Relationships  . Social connections:    Talks on phone: Not on file    Gets together: Not on file    Attends religious service: Not on file    Active member of club or organization: Not on file    Attends meetings of clubs or organizations: Not on file    Relationship status: Not on file  . Intimate partner violence:    Fear of current or ex partner: Not on file    Emotionally abused: Not on file    Physically abused: Not on file    Forced sexual activity: Not on file  Other Topics Concern  . Not on file  Social History Narrative   Fun: Yoga, hiking, going to the gym.     Past Medical History, Surgical history, Social history, and Family history were reviewed and updated as appropriate.   Please see review of systems for further details on the Frank Webster's review  from today.   Objective:   Physical Exam:  BP 92/62   Pulse 74   Physical Exam  Constitutional: Frank Webster is oriented to person, place, and time. Frank Webster appears well-developed. No distress.  Musculoskeletal: Frank Webster exhibits no deformity.  Neurological: Frank Webster is alert and oriented to person, place, and time. Coordination normal.  Psychiatric: Frank Webster has a normal mood and affect. His speech  is normal and behavior is normal. Judgment and thought content normal. His mood appears not anxious. His affect is not angry, not blunt, not labile and not inappropriate. Cognition and memory are normal. Frank Webster does not exhibit a depressed mood. Frank Webster expresses no homicidal and no suicidal ideation. Frank Webster expresses no suicidal plans and no homicidal plans.  Insight intact. No auditory or visual hallucinations. No delusions.     Lab Review:     Component Value Date/Time   NA 136 03/24/2018 1123   K 4.1 03/24/2018 1123   CL 101 03/24/2018 1123   CO2 27 03/24/2018 1123   GLUCOSE 101 (H) 03/24/2018 1123   GLUCOSE 76 02/28/2009   BUN 12 03/24/2018 1123   CREATININE 0.76 03/24/2018 1123   CALCIUM 9.5 03/24/2018 1123   PROT 6.8 03/24/2018 1123   ALBUMIN 4.6 05/29/2016 1441   AST 19 03/24/2018 1123   ALT 28 03/24/2018 1123   ALKPHOS 70 05/29/2016 1441   BILITOT 0.4 03/24/2018 1123   GFRNONAA 111 03/24/2018 1123   GFRAA 129 03/24/2018 1123       Component Value Date/Time   WBC 4.9 03/24/2018 1123   RBC 4.51 03/24/2018 1123   HGB 13.9 03/24/2018 1123   HCT 40.5 03/24/2018 1123   PLT 279 03/24/2018 1123   MCV 89.8 03/24/2018 1123   MCH 30.8 03/24/2018 1123   MCHC 34.3 03/24/2018 1123   RDW 12.8 03/24/2018 1123   LYMPHSABS 2,465 03/24/2018 1123   MONOABS 360 05/29/2016 1441   EOSABS 59 03/24/2018 1123   BASOSABS 20 03/24/2018 1123    No results found for: POCLITH, LITHIUM   No results found for: PHENYTOIN, PHENOBARB, VALPROATE, CBMZ   .res Assessment: Plan:   Frank Webster seen for 30 minutes and greater than 50% of  visit spent counseling Frank Webster regarding treatment options for chronic insomnia which has been unimproved with trazodone, doxepin, and Trileptal.  Frank Webster reports that Frank Webster would prefer to avoid any medications that could cause dependence due to being in recovery.  Case staffed with Dr. Jennelle Humanottle.  Discussed potential benefits, risks, and side effects of Belsomra and that Belsomra has not been associated with dependence.  Recommended taking Belsomra 15 mg at bedtime for 10 days and then increasing to 20 mg at bedtime if insomnia has not improved.  Frank Webster provided with voucher for 10 free tablets of 15 mg and 20 mg strength tablets.  Frank Webster advised to call office and request 30-day prescription be sent to pharmacy if Belsomra is effective for his insomnia and for which strength Belsomra was most effective.   Will continue Viibryd 20 mg daily for mood and anxiety. Will continue Lamictal 200 mg daily for mood. Will continue Trileptal 150 mg every morning and 300 mg nightly for mood and anxiety.  Primary insomnia - Plan: Suvorexant (BELSOMRA) 15 MG TABS, Suvorexant (BELSOMRA) 20 MG TABS  PTSD (post-traumatic stress disorder)  Mood disorder (HCC)  Please see After Visit Summary for Frank Webster specific instructions.  Future Appointments  Date Time Provider Department Center  09/08/2018 11:30 AM [facility]  09/22/2018 11:30 AM Blanchard Kelchixon, Stephanie N, NP [facility]  10/16/2018 11:30 AM Corie Chiquitoarter, Britt Petroni, PMHNP [facility] None    No orders of the defined types were placed in this encounter.     -------------------------------

## 2018-09-05 ENCOUNTER — Telehealth: Payer: Self-pay

## 2018-09-05 NOTE — Telephone Encounter (Signed)
PA approved for Belsomra 20mg  tabs from 09/02/2018-09/02/2021

## 2018-09-08 ENCOUNTER — Other Ambulatory Visit: Payer: Self-pay | Admitting: *Deleted

## 2018-09-08 ENCOUNTER — Other Ambulatory Visit (HOSPITAL_COMMUNITY)
Admission: RE | Admit: 2018-09-08 | Discharge: 2018-09-08 | Disposition: A | Discharge status: Home / Self Care | Payer: BC Managed Care – PPO | Source: Ambulatory Visit | Attending: Infectious Diseases | Admitting: Infectious Diseases

## 2018-09-08 ENCOUNTER — Other Ambulatory Visit: Payer: BC Managed Care – PPO

## 2018-09-08 DIAGNOSIS — Z113 Encounter for screening for infections with a predominantly sexual mode of transmission: Secondary | ICD-10-CM

## 2018-09-08 DIAGNOSIS — B20 Human immunodeficiency virus [HIV] disease: Secondary | ICD-10-CM

## 2018-09-08 DIAGNOSIS — Z79899 Other long term (current) drug therapy: Secondary | ICD-10-CM

## 2018-09-09 ENCOUNTER — Other Ambulatory Visit: Payer: BC Managed Care – PPO

## 2018-09-09 LAB — T-HELPER CELL (CD4) - (RCID CLINIC ONLY)

## 2018-09-09 LAB — URINE CYTOLOGY ANCILLARY ONLY
Chlamydia: NEGATIVE
Neisseria Gonorrhea: NEGATIVE

## 2018-09-10 ENCOUNTER — Other Ambulatory Visit: Payer: BC Managed Care – PPO

## 2018-09-10 LAB — COMPLETE METABOLIC PANEL WITH GFR
AG RATIO: 1.8 (calc) (ref 1.0–2.5)
ALT: 45 U/L (ref 9–46)
AST: 26 U/L (ref 10–40)
Albumin: 4.6 g/dL (ref 3.6–5.1)
Alkaline phosphatase (APISO): 62 U/L (ref 40–115)
BUN: 14 mg/dL (ref 7–25)
CALCIUM: 9.7 mg/dL (ref 8.6–10.3)
CHLORIDE: 100 mmol/L (ref 98–110)
CO2: 27 mmol/L (ref 20–32)
Creat: 0.84 mg/dL (ref 0.60–1.35)
GFR, EST AFRICAN AMERICAN: 123 mL/min/{1.73_m2} (ref >=60)
GFR, EST NON AFRICAN AMERICAN: 106 mL/min/{1.73_m2} (ref >=60)
Globulin: 2.5 g/dL (calc) (ref 1.9–3.7)
Glucose, Bld: 125 mg/dL — ABNORMAL HIGH (ref 65–99)
POTASSIUM: 4.2 mmol/L (ref 3.5–5.3)
Sodium: 136 mmol/L (ref 135–146)
TOTAL PROTEIN: 7.1 g/dL (ref 6.1–8.1)
Total Bilirubin: 0.4 mg/dL (ref 0.2–1.2)

## 2018-09-10 LAB — CBC WITH DIFFERENTIAL/PLATELET
BASOS ABS: 11 {cells}/uL (ref 0–200)
BASOS PCT: 0.2 %
EOS ABS: 50 {cells}/uL (ref 15–500)
Eosinophils Relative: 0.9 %
HCT: 43.2 % (ref 38.5–50.0)
Hemoglobin: 15 g/dL (ref 13.2–17.1)
LYMPHS ABS: 2604 {cells}/uL (ref 850–3900)
MCH: 31.9 pg (ref 27.0–33.0)
MCHC: 34.7 g/dL (ref 32.0–36.0)
MCV: 91.9 fL (ref 80.0–100.0)
MONOS PCT: 9.6 %
MPV: 9.8 fL (ref 7.5–12.5)
Neutro Abs: 2397 cells/uL (ref 1500–7800)
Neutrophils Relative %: 42.8 %
PLATELETS: 300 10*3/uL (ref 140–400)
RBC: 4.7 10*6/uL (ref 4.20–5.80)
RDW: 12.7 % (ref 11.0–15.0)
TOTAL LYMPHOCYTE: 46.5 %
WBC mixed population: 538 cells/uL (ref 200–950)
WBC: 5.6 10*3/uL (ref 3.8–10.8)

## 2018-09-10 LAB — LIPID PANEL
Cholesterol: 268 mg/dL — ABNORMAL HIGH (ref <200)
HDL: 35 mg/dL — AB (ref >40)
LDL CHOLESTEROL (CALC): 203 mg/dL — AB
Non-HDL Cholesterol (Calc): 233 mg/dL (calc) — ABNORMAL HIGH (ref <130)
TRIGLYCERIDES: 143 mg/dL (ref <150)
Total CHOL/HDL Ratio: 7.7 (calc) — ABNORMAL HIGH (ref <5.0)

## 2018-09-10 LAB — HIV-1 RNA QUANT-NO REFLEX-BLD
HIV 1 RNA QUANT: DETECTED {copies}/mL — AB
HIV-1 RNA QUANT, LOG: DETECTED {Log_copies}/mL — AB

## 2018-09-10 LAB — RPR: RPR Ser Ql: NONREACTIVE

## 2018-09-12 LAB — T-HELPER CELL (CD4) - (RCID CLINIC ONLY)
CD4 % Helper T Cell: 42 % (ref 33–55)
CD4 T CELL ABS: 1470 /uL (ref 400–2700)

## 2018-09-19 ENCOUNTER — Other Ambulatory Visit: Payer: Self-pay | Admitting: Psychiatry

## 2018-09-19 ENCOUNTER — Telehealth: Payer: Self-pay | Admitting: Psychiatry

## 2018-09-19 DIAGNOSIS — F5101 Primary insomnia: Secondary | ICD-10-CM

## 2018-09-19 MED ORDER — HYDROXYZINE HCL 25 MG PO TABS: ORAL_TABLET | ORAL | 2 refills | Status: DC

## 2018-09-19 NOTE — Telephone Encounter (Signed)
Pt. Called and said that the sleep medication you prescribed is not working. Pt still waking up 5 to 6 times a night wakes up in anguish and does not feel good. What can be done.

## 2018-09-19 NOTE — Telephone Encounter (Signed)
He's aware. 

## 2018-09-19 NOTE — Progress Notes (Signed)
Pt called to report that Belsomra was ineffective. Script sent for Hydroxyzine 25 mg 1-2 po QHS prn insomnia.

## 2018-09-19 NOTE — Telephone Encounter (Signed)
He's not tried hydroxyzine that he's aware of. He would like to try.  Uses [facility] on [address].

## 2018-09-22 ENCOUNTER — Telehealth: Payer: Self-pay | Admitting: Psychiatry

## 2018-09-22 ENCOUNTER — Ambulatory Visit (INDEPENDENT_AMBULATORY_CARE_PROVIDER_SITE_OTHER): Payer: BC Managed Care – PPO | Admitting: Infectious Diseases

## 2018-09-22 ENCOUNTER — Encounter: Payer: Self-pay | Admitting: Infectious Diseases

## 2018-09-22 VITALS — BP 116/75 | HR 71 | Temp 98.0°F | Ht 68.0 in | Wt 160.0 lb

## 2018-09-22 DIAGNOSIS — F5101 Primary insomnia: Secondary | ICD-10-CM

## 2018-09-22 DIAGNOSIS — E7801 Familial hypercholesterolemia: Secondary | ICD-10-CM | POA: Diagnosis not present

## 2018-09-22 DIAGNOSIS — F411 Generalized anxiety disorder: Secondary | ICD-10-CM | POA: Diagnosis not present

## 2018-09-22 DIAGNOSIS — R6882 Decreased libido: Secondary | ICD-10-CM | POA: Diagnosis not present

## 2018-09-22 DIAGNOSIS — B20 Human immunodeficiency virus [HIV] disease: Secondary | ICD-10-CM | POA: Diagnosis not present

## 2018-09-22 MED ORDER — ESZOPICLONE 2 MG PO TABS: 2.0000 mg | ORAL_TABLET | Freq: Every evening | ORAL | 0 refills | Status: DC | PRN

## 2018-09-22 NOTE — Telephone Encounter (Signed)
Pt contacted with information

## 2018-09-22 NOTE — Progress Notes (Signed)
Name: Frank Webster  DOB: October 25, 1975  MRN: [id number]  PCP: Patient, No Pcp Per   Reason for Visit: Routine HIV Care  Brief Narrative: Frank Webster is a 42 y.o. male with HIV disease. Has seen Dr. Luciana Axe in the clinic and previously well controlled with low level viremia < 100 copies the last few draws. Diagnosed 22 years ago; had a time where he struggled with adherence 2/2 drug use. OI Hx: none. HIV Risk: MSM, previous drug use.   Previous Regimens:   Genvoya    Triumeq + Tivicay PM (DDI with Trileptel)  Genotype:   01/2014 - no INSTI resistance, wild type   Patient Active Problem List   Diagnosis Date Noted  . Anxiety state 04/05/2016  . Decreased libido 04/05/2016  . Hyperlipidemia 04/21/2015  . Nonallopathic lesion of thoracic region 04/14/2015  . Scapular dyskinesis 03/24/2015  . Health care maintenance 02/15/2015  . Screening examination for venereal disease 09/30/2014  . Seasonal allergies 01/27/2014  . Hemorrhoid 09/29/2012  . Depression, major (HCC) 10/31/2010  . Human immunodeficiency virus (HIV) disease (HCC) 02/23/2010    CC:  Frank Webster is here today for routine follow up care of his HIV.   HPI: Reports 100% adherence to Triumeq + PM Tivicay and no concern over side effect. Continues on psychiatric regimen including trileptal. Has had worsening anxiety and poor sleep lately. He is under a lot of stress - current boyfriend is an actively using drug addict and trying to help support him through rehab process and be a "functioning adult off drugs is hard." Feels that he is "disgusted with his weight and physical appearance;" obsesses about his weight and feels that he may have body dysmorphia. Hard to get motivated to go to work and often shows up late but getting all his work done for now. He estimates he awakens up to 9 times a night now and has had trouble falling asleep. He is working with his psychiatrist for medical management and has an appointment with a new  therapist next week which he is looking forward to.   His father and mother both have elevated blood pressure and cholesterol.   Continues to abstain from illicit substances. Working new job   Review of Systems  Constitutional: Negative for chills, fever, malaise/fatigue and weight loss.  HENT: Negative for sore throat.        No dental problems  Respiratory: Negative for cough and sputum production.   Cardiovascular: Negative for chest pain and leg swelling.  Gastrointestinal: Negative for abdominal pain, diarrhea and vomiting.  Genitourinary: Negative for dysuria and flank pain.  Musculoskeletal: Negative for joint pain, myalgias and neck pain.  Skin: Negative for rash.  Neurological: Negative for dizziness, tingling and headaches.  Psychiatric/Behavioral: Positive for depression. Negative for substance abuse and suicidal ideas. The patient is nervous/anxious and has insomnia.     Past Medical History:  Diagnosis Date  . Depression   . HIV infection (HCC)   . Substance abuse in remission (HCC)    No Known Allergies  Social History   Tobacco Use  . Smoking status: Former Smoker    Packs/day: 0.75    Years: 25.00    Pack years: 18.75    Types: Cigarettes    Last attempt to quit: 03/04/2016    Years since quitting: 2.5  . Smokeless tobacco: Never Used  . Tobacco comment: wants to discuss options today  Substance Use Topics  . Alcohol use: No    Alcohol/week: 0.0 standard drinks  Comment: in recovery  . Drug use: No    Comment: in recovery   Social History   Substance and Sexual Activity  Sexual Activity Yes  . Partners: Male  . Birth control/protection: Condom   Objective:  Vitals:   09/22/18 1143  BP: 116/75  Pulse: 71  Temp: 98 F (36.7 C)  Weight: 160 lb (72.6 kg)  Height: 5\' 8"  (1.727 m)   Body mass index is 24.33 kg/m.  Physical Exam  Constitutional: He is oriented to person, place, and time and well-developed, well-nourished, and in no  distress.  Seated comfortably in the chair. Good spirits today.   HENT:  Mouth/Throat: Oropharynx is clear and moist. No oral lesions. Normal dentition. No dental caries.  Eyes: No scleral icterus.  Cardiovascular: Normal rate, regular rhythm and normal heart sounds.  Pulmonary/Chest: Effort normal and breath sounds normal.  Abdominal: Soft. He exhibits no distension. There is no tenderness.  Musculoskeletal: Normal range of motion. He exhibits no edema.  Lymphadenopathy:    He has no cervical adenopathy.  Neurological: He is alert and oriented to person, place, and time.  Skin: Skin is warm and dry. No rash noted.  Psychiatric: Affect normal.  Tearful at times during conversation    Lab Results Lab Results  Component Value Date   WBC 5.6 09/08/2018   HGB 15.0 09/08/2018   HCT 43.2 09/08/2018   MCV 91.9 09/08/2018   PLT 300 09/08/2018    Lab Results  Component Value Date   CREATININE 0.84 09/08/2018   BUN 14 09/08/2018   NA 136 09/08/2018   K 4.2 09/08/2018   CL 100 09/08/2018   CO2 27 09/08/2018    Lab Results  Component Value Date   ALT 45 09/08/2018   AST 26 09/08/2018   ALKPHOS 70 05/29/2016   BILITOT 0.4 09/08/2018    Lab Results  Component Value Date   CHOL 268 (H) 09/08/2018   HDL 35 (L) 09/08/2018   LDLCALC 203 (H) 09/08/2018   TRIG 143 09/08/2018   CHOLHDL 7.7 (H) 09/08/2018   HIV 1 RNA Quant (copies/mL)  Date Value  09/08/2018 <20 DETECTED (A)  07/07/2018 53 (H)  03/24/2018 <20 DETECTED (A)   CD4 T Cell Abs (/uL)  Date Value  09/09/2018 1,470  09/08/2018    QUESTIONABLE RESULTS, RECOMMEND RECOLLECT TO VERIFY  03/24/2018 980   Lab Results  Component Value Date   HAV NEG 02/24/2010   Lab Results  Component Value Date   HEPBSAG NEG 02/24/2010   HEPBSAB INDETER (A) 02/24/2010   Lab Results  Component Value Date   HCVAB NEGATIVE 09/14/2014   Lab Results  Component Value Date   CHLAMYDIAWP Negative 09/08/2018   N Negative 09/08/2018      Problem List Items Addressed This Visit      Unprioritized   Anxiety state    Worsening - he is working with psychiatrist and has an appt with new psychologist. Medical management per psychiatry.       Decreased libido    Multifactorial in setting of psychiatric medication s/e, hyperlipidemia. Can increase sildenafil to 50-100 mg now that he is off cobicistat inducer.       Human immunodeficiency virus (HIV) disease (HCC) - Primary    Ongoing viral suppression with current regimen of Triumeq + PM Tivicay. Will continue both of these for him. No family history of MI but +hyperlipidemia as outlined below. Declined STI testing. In dental care regularly. Will have him return in 7212m  with labs prior to.       Relevant Orders   HIV-1 RNA quant-no reflex-bld   T-helper cell (CD4)- ([facility] clinic only)   Lipid panel   Hemoglobin A1c   Hyperlipidemia    Lab Results  Component Value Date   CHOL 268 (H) 09/08/2018   HDL 35 (L) 09/08/2018   LDLCALC 203 (H) 09/08/2018   TRIG 143 09/08/2018   CHOLHDL 7.7 (H) 09/08/2018   Srong first degree family history of hyperlipidemia. He is not diabetic but family history of this. BP under good control however with HIV diagnosis he is at higher r/f cardiac event. Reviewed diet recall with him today and sounds he is sticking to mostly non-processed foods and on the lower carb mentality. I encouraged him to continue current diet practices and avoid processed food/carbs. Incorporate exercise for cardiac benefit and emotional/mental support. Recommend to start statin - will begin Crestor 10 mg QD and refer to cardiology for management.       Relevant Medications   rosuvastatin (CRESTOR) 10 MG tablet   Other Relevant Orders   Ambulatory referral to Cardiology     Rexene Alberts, MSN, NP-C [facility] Pager: [phone]  09/24/2018  12:52 PM

## 2018-09-22 NOTE — Telephone Encounter (Signed)
Pt called and reported to staff that hydroxyzine was ineffective and caused side effects. Please call pt and let him know that script for Alfonso PattenLunesta was sent to pharmacy. Please let him know that it is a controlled substance and is a long-acting sleep medication which should help prevent multiple middle of the night awakenings. Should take before bedtime and when he has at least 6 hours available to sleep. Metallic taste can be a side effect. Please advise him to call for RF if Alfonso PattenLunesta is effective.

## 2018-09-22 NOTE — Telephone Encounter (Signed)
Pt. Called and said that the new sleeping mmedicine given to him lasdt week. The med didn't work took hours to go to sleep and still waking up two or three times a night. Also med increased heart rate and trouble breathing.

## 2018-09-22 NOTE — Patient Instructions (Addendum)
For sildenefil - try 50 - 100 mg per dose (2-4 pills)   Will send you a MyChart about cholesterol medication I want you to start taking. With your family history we may want to refer you to an internal medicine team to help with managing this for you.   Will have you back in 3 months with labs prior to your visit - please nothing to eat or drink 8 hours prior to the test, only water or black coffee.   Continue your Triumeq and Tivicay as you are taking now.   Happy Holidays!!

## 2018-09-24 MED ORDER — ROSUVASTATIN CALCIUM 10 MG PO TABS: 10.0000 mg | ORAL_TABLET | Freq: Every day | ORAL | 2 refills | Status: DC

## 2018-09-24 MED ORDER — SILDENAFIL CITRATE 25 MG PO TABS: 50.0000 mg | ORAL_TABLET | Freq: Every day | ORAL | 2 refills | Status: DC | PRN

## 2018-09-24 NOTE — Assessment & Plan Note (Signed)
Multifactorial in setting of psychiatric medication s/e, hyperlipidemia. Can increase sildenafil to 50-100 mg now that he is off cobicistat inducer.

## 2018-09-24 NOTE — Assessment & Plan Note (Signed)
Worsening - he is working with psychiatrist and has an appt with new psychologist. Medical management per psychiatry.

## 2018-09-24 NOTE — Assessment & Plan Note (Signed)
Ongoing viral suppression with current regimen of Triumeq + PM Tivicay. Will continue both of these for him. No family history of MI but +hyperlipidemia as outlined below. Declined STI testing. In dental care regularly. Will have him return in 5121m with labs prior to.

## 2018-09-24 NOTE — Assessment & Plan Note (Addendum)
Lab Results  Component Value Date   CHOL 268 (H) 09/08/2018   HDL 35 (L) 09/08/2018   LDLCALC 203 (H) 09/08/2018   TRIG 143 09/08/2018   CHOLHDL 7.7 (H) 09/08/2018   Srong first degree family history of hyperlipidemia. He is not diabetic but family history of this. BP under good control however with HIV diagnosis he is at higher r/f cardiac event. Reviewed diet recall with him today and sounds he is sticking to mostly non-processed foods and on the lower carb mentality. I encouraged him to continue current diet practices and avoid processed food/carbs. Incorporate exercise for cardiac benefit and emotional/mental support. Recommend to start statin - will begin Crestor 10 mg QD and refer to cardiology for management.

## 2018-10-16 ENCOUNTER — Encounter: Payer: Self-pay | Admitting: Psychiatry

## 2018-10-16 ENCOUNTER — Ambulatory Visit: Payer: BC Managed Care – PPO | Admitting: Psychiatry

## 2018-10-16 DIAGNOSIS — F39 Unspecified mood [affective] disorder: Secondary | ICD-10-CM | POA: Diagnosis not present

## 2018-10-16 DIAGNOSIS — F4310 Post-traumatic stress disorder, unspecified: Secondary | ICD-10-CM | POA: Diagnosis not present

## 2018-10-16 DIAGNOSIS — F5101 Primary insomnia: Secondary | ICD-10-CM | POA: Diagnosis not present

## 2018-10-16 MED ORDER — VILAZODONE HCL 20 MG PO TABS: 20.0000 mg | ORAL_TABLET | Freq: Every day | ORAL | 1 refills | Status: DC

## 2018-10-16 MED ORDER — LURASIDONE HCL 20 MG PO TABS: 20.0000 mg | ORAL_TABLET | Freq: Every day | ORAL | 0 refills | Status: DC

## 2018-10-16 MED ORDER — OXCARBAZEPINE 150 MG PO TABS: ORAL_TABLET | ORAL | 0 refills | Status: DC

## 2018-10-16 MED ORDER — ESZOPICLONE 3 MG PO TABS: 3.0000 mg | ORAL_TABLET | Freq: Every day | ORAL | 1 refills | Status: DC

## 2018-10-16 MED ORDER — BUSPIRONE HCL 15 MG PO TABS: ORAL_TABLET | ORAL | 1 refills | Status: DC

## 2018-10-16 MED ORDER — LAMOTRIGINE 200 MG PO TABS: 200.0000 mg | ORAL_TABLET | Freq: Every day | ORAL | 0 refills | Status: DC

## 2018-10-16 NOTE — Progress Notes (Signed)
Frank Webster [id number] July 01, 1976 43 y.o.  Subjective:   Patient ID:  Frank Webster is a 43 y.o. (DOB 01/09/1976) male.  Chief Complaint:  Chief Complaint  Patient presents with  . Anxiety  . Insomnia  . Depression    HPI Frank Webster presents to the office today for follow-up of insomnia. He reports that he continues to have frequent middle of the night awakening. He reports that he is now realizing he is "more tense" than he realized when going to bed since he felt more relaxed after taking Lunesta the first few nights. Reports that he slept well the first couple of nights after starting Lunesta and then sleep gradually worsened. He reports that he is now taking Lunesta only as needed.   He reports that he is noticing he is very regimented and "Type-A." Reports that he has frequent anxious thoughts- "everything's got to be right." He notices he is more rigid and critical than he would like to be. Reports that he is frequently tense. Denies panic attacks.   He reports that he has difficulty getting motivated in the morning. Motivation is ok once he gets going. He reports that he is "easily snappy" and does not have as much patience. He reports that mild irritability "comes in waves." He has had some depression. Reports that energy has been very low. He reports gaining 40 lbs in 5 years since he became sober. He reports that the majority of his weight gain has been in his mid-section. He reports that he is "digusted" and "horified" by the way he looks and questions if he perceives the weight gain as worse than it likely is. Reports that he would like to lose weight but does not have the energy or motivation to lose weight. He reports that he is ruminating about weight gain. He reports that his eating habits are "worse." He reports that 6 months ago trainer encouraged him to eat more and thinks that he increased portion sizes at that time. He reports adequate concentration. Denies  impulsive or risky behaviors. Denies SI.   Just got a new puppy.   Past medication trials: Prozac-vomiting Celexa Cymbalta Viibryd BuSpar Wellbutrin Lamictal Trileptal Trazodone Doxazosin Clonidine Trazodone-excessive somnolence, vivid dreams Doxepin-ineffective for insomnia Belsomra- adverse reaction Lunesta- Helped for about 2 nights and then no longer effective.  Hydroxyzine- ineffective.   Review of Systems:  Review of Systems  Musculoskeletal: Negative for gait problem.       Trigger finger. Has apt for this.   Neurological: Negative for tremors.  Psychiatric/Behavioral:       Please refer to HPI    Medications: I have reviewed the patient's current medications.  Current Outpatient Medications  Medication Sig Dispense Refill  . abacavir-dolutegravir-lamiVUDine (TRIUMEQ) 600-50-300 MG tablet Take 1 tablet by mouth every morning. 30 tablet 11  . b complex vitamins capsule Take 1 capsule by mouth daily.    . cholecalciferol (VITAMIN D) 1000 UNITS tablet Take 2,000 Units by mouth daily.     . dolutegravir (TIVICAY) 50 MG tablet Take 1 tablet (50 mg total) by mouth at bedtime. 30 tablet 11  . fluticasone (FLONASE) 50 MCG/ACT nasal spray Place 2 sprays into both nostrils daily. 16 g 0  . folic acid (FOLVITE) 800 MCG tablet Take 1,200 mcg by mouth daily.    Marland Kitchen glucosamine-chondroitin 500-400 MG tablet Take 1 tablet by mouth 3 (three) times daily.    Marland Kitchen ibuprofen (ADVIL,MOTRIN) 600 MG tablet Take 1 tablet (600 mg total) by mouth  every 6 (six) hours as needed. 30 tablet 0  . lamoTRIgine (LAMICTAL) 200 MG tablet Take 1 tablet (200 mg total) by mouth daily. 90 tablet 0  . loratadine (CLARITIN) 10 MG tablet Take 10 mg by mouth daily.    . NON FORMULARY Tumeric    . rosuvastatin (CRESTOR) 10 MG tablet Take 1 tablet (10 mg total) by mouth daily. 30 tablet 2  . sildenafil (VIAGRA) 25 MG tablet Take 2-4 tablets (50-100 mg total) by mouth daily as needed for erectile dysfunction. 10  tablet 2  . Vilazodone HCl (VIIBRYD) 20 MG TABS Take 1 tablet (20 mg total) by mouth daily. 90 tablet 1  . vitamin C (ASCORBIC ACID) 250 MG tablet Take 250 mg by mouth daily.    . busPIRone (BUSPAR) 15 MG tablet Take 1/3 tablet p.o. twice daily for 1 week, then take 2/3 tablet p.o. twice daily for 1 week, then take 1 tablet p.o. twice daily 60 tablet 1  . Eszopiclone 3 MG TABS Take 1 tablet (3 mg total) by mouth at bedtime. Take immediately before bedtime 30 tablet 1  . OXcarbazepine (TRILEPTAL) 150 MG tablet Take 1 tab po q am and 2 tabs po QHS 270 tablet 0   No current facility-administered medications for this visit.     Medication Side Effects: None  Allergies: No Known Allergies  Past Medical History:  Diagnosis Date  . Depression   . HIV infection (HCC)   . Substance abuse in remission [facility])     Family History  Problem Relation Age of Onset  . Cancer Maternal Grandmother   . Tremor Father   . Eating disorder Sister   . Depression Sister   . Tremor Paternal Uncle   . Tremor Paternal Grandfather   . Eating disorder Sister   . Eating disorder Sister   . Arthritis Neg Hx   . Lupus Neg Hx   . Rosacea Neg Hx     Social History   Socioeconomic History  . Marital status: Single    Spouse name: Not on file  . Number of children: 0  . Years of education: 45  . Highest education level: Not on file  Occupational History  . Occupation: Academic librarian  . Financial resource strain: Not on file  . Food insecurity:    Worry: Not on file    Inability: Not on file  . Transportation needs:    Medical: Not on file    Non-medical: Not on file  Tobacco Use  . Smoking status: Former Smoker    Packs/day: 0.75    Years: 25.00    Pack years: 18.75    Types: Cigarettes    Last attempt to quit: 03/04/2016    Years since quitting: 2.6  . Smokeless tobacco: Never Used  . Tobacco comment: wants to discuss options today  Substance and Sexual Activity  . Alcohol use: No     Alcohol/week: 0.0 standard drinks    Comment: in recovery  . 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 Sexual activity: Yes    Partners: Male    Birth control/protection: Condom  Lifestyle  . Physical activity:    Days per week: Not on file    Minutes per session: Not on file  . Stress: Not on file  Relationships  . Social connections:    Talks on phone: Not on file    Gets together: Not on file    Attends religious service: Not  on file    Active member of club or organization: Not on file    Attends meetings of clubs or organizations: Not on file    Relationship status: Not on file  . Intimate partner violence:    Fear of current or ex partner: Not on file    Emotionally abused: Not on file    Physically abused: Not on file    Forced sexual activity: Not on file  Other Topics Concern  . Not on file  Social History Narrative   Fun: Yoga, hiking, going to the gym.     Past Medical History, Surgical history, Social history, and Family history were reviewed and updated as appropriate.   Please see review of systems for further details on the patient's review from today.   Objective:   Physical Exam:  There were no vitals taken for this visit.  Physical Exam Constitutional:      General: He is not in acute distress.    Appearance: He is well-developed.  Musculoskeletal:        General: No deformity.  Neurological:     Mental Status: He is alert and oriented to person, place, and time.     Coordination: Coordination normal.  Psychiatric:        Mood and Affect: Mood is anxious. Mood is not depressed. Affect is not labile, blunt, angry or inappropriate.        Speech: Speech normal.        Behavior: Behavior normal.        Thought Content: Thought content normal. Thought content does not include homicidal or suicidal ideation. Thought content does not include homicidal or suicidal plan.        Judgment: Judgment normal.     Comments: Presents as tense on exam.  Insight  intact. No auditory or visual hallucinations. No delusions.      Lab Review:     Component Value Date/Time   NA 136 09/08/2018 1151   K 4.2 09/08/2018 1151   CL 100 09/08/2018 1151   CO2 27 09/08/2018 1151   GLUCOSE 125 (H) 09/08/2018 1151   GLUCOSE 76 02/28/2009   BUN 14 09/08/2018 1151   CREATININE 0.84 09/08/2018 1151   CALCIUM 9.7 09/08/2018 1151   PROT 7.1 09/08/2018 1151   ALBUMIN 4.6 05/29/2016 1441   AST 26 09/08/2018 1151   ALT 45 09/08/2018 1151   ALKPHOS 70 05/29/2016 1441   BILITOT 0.4 09/08/2018 1151   GFRNONAA 106 09/08/2018 1151   GFRAA 123 09/08/2018 1151       Component Value Date/Time   WBC 5.6 09/08/2018 1151   RBC 4.70 09/08/2018 1151   HGB 15.0 09/08/2018 1151   HCT 43.2 09/08/2018 1151   PLT 300 09/08/2018 1151   MCV 91.9 09/08/2018 1151   MCH 31.9 09/08/2018 1151   MCHC 34.7 09/08/2018 1151   RDW 12.7 09/08/2018 1151   LYMPHSABS 2,604 09/08/2018 1151   MONOABS 360 05/29/2016 1441   EOSABS 50 09/08/2018 1151   BASOSABS 11 09/08/2018 1151    No results found for: POCLITH, LITHIUM   No results found for: PHENYTOIN, PHENOBARB, VALPROATE, CBMZ   .res Assessment: Plan:   Discussed potential benefits, risks, and side effects of starting Buspar and increasing Lunesta. Will increase Lunesta to 3 mg po QHS prn for insomnia. Start BuSpar 15 mg 1/3 tablet twice daily for 1 week, then increase to 2/3 tablet twice daily for 1 week, then increase to 1 tablet twice daily for  anxiety. Continue Lamictal and Trileptal for mood stabilization. Continue Viibryd for mood and anxiety.   PTSD (post-traumatic stress disorder) - Chronic with recent increased anxiety - Plan: busPIRone (BUSPAR) 15 MG tablet, Vilazodone HCl (VIIBRYD) 20 MG TABS  Primary insomnia - Chronic, not controlled - Plan: Eszopiclone 3 MG TABS  Mood disorder (HCC) - Chronic with recent increase in depressive s/s. - Plan: lamoTRIgine (LAMICTAL) 200 MG tablet, OXcarbazepine (TRILEPTAL) 150 MG  tablet, Vilazodone HCl (VIIBRYD) 20 MG TABS  Please see After Visit Summary for patient specific instructions.  Future Appointments  Date Time Provider Department Center  11/27/2018 10:30 AM Corie Chiquito, PMHNP [facility] None  12/22/2018  9:00 AM [facility]  01/12/2019  9:00 AM Blanchard Kelch, NP [facility]    No orders of the defined types were placed in this encounter.     -------------------------------

## 2018-10-23 ENCOUNTER — Telehealth: Payer: Self-pay | Admitting: Psychiatry

## 2018-10-23 DIAGNOSIS — F5101 Primary insomnia: Secondary | ICD-10-CM

## 2018-10-23 NOTE — Telephone Encounter (Signed)
Do you want me to cancel and call in at other pharmacy ?

## 2018-10-23 NOTE — Telephone Encounter (Signed)
Patient called to ask that his eszopiclone be sent to the [facility] at [facility].  It was sent to [facility], but it is too expensive there so he didn't pick it up.

## 2018-10-24 MED ORDER — ESZOPICLONE 3 MG PO TABS: 3.0000 mg | ORAL_TABLET | Freq: Every day | ORAL | 1 refills | Status: DC

## 2018-10-24 NOTE — Telephone Encounter (Signed)
Left voicemail rx sent to [facility] at [facility]

## 2018-10-24 NOTE — Telephone Encounter (Signed)
Called rx into [facility] as requested and cancelled rx at [facility]. Will notify pt.

## 2018-11-27 ENCOUNTER — Ambulatory Visit: Payer: BC Managed Care – PPO | Admitting: Psychiatry

## 2018-11-27 VITALS — BP 116/76 | HR 68

## 2018-11-27 DIAGNOSIS — F5101 Primary insomnia: Secondary | ICD-10-CM | POA: Diagnosis not present

## 2018-11-27 DIAGNOSIS — F39 Unspecified mood [affective] disorder: Secondary | ICD-10-CM

## 2018-11-27 DIAGNOSIS — F4310 Post-traumatic stress disorder, unspecified: Secondary | ICD-10-CM

## 2018-11-27 MED ORDER — LAMOTRIGINE 200 MG PO TABS: 200.0000 mg | ORAL_TABLET | Freq: Every day | ORAL | 1 refills | Status: DC

## 2018-11-27 MED ORDER — OXCARBAZEPINE 150 MG PO TABS: ORAL_TABLET | ORAL | 1 refills | Status: DC

## 2018-11-27 MED ORDER — GABAPENTIN 100 MG PO CAPS: ORAL_CAPSULE | ORAL | 2 refills | Status: DC

## 2018-11-27 MED ORDER — VILAZODONE HCL 20 MG PO TABS: 20.0000 mg | ORAL_TABLET | Freq: Every day | ORAL | 1 refills | Status: DC

## 2018-11-27 MED ORDER — BUSPIRONE HCL 15 MG PO TABS: ORAL_TABLET | ORAL | 2 refills | Status: DC

## 2018-11-27 NOTE — Progress Notes (Signed)
Frank Webster [id number] July 12, 1976 43 y.o.  Subjective:   Patient ID:  Frank Webster is a 43 y.o. (DOB 01-31-1976) male.  Chief Complaint:  Chief Complaint  Patient presents with  . Insomnia  . Follow-up    h/o anxiety, depression, and insomnia    HPI Frank Webster presents to the office today for follow-up of insomnia, anxiety, and mood. Reports that Frank Webster was not effective for his sleep. He reports taking Melatonin a few times a week and thinks that it is partially effective. No longer having difficulty falling asleep. Continues to awaken multiple times a night. He reports that he has been told that he is moving around in his sleep and occasionally has a "viscious snore." Denies severe restlessness. Denies nightmares.   He reports that he has been going through a break up in the last few weeks and that this has been upsetting for him and he has had some tearfulness. Reports that mood was ok prior to the end of the relationship. Reports that he had some increased anxiety prior to the end of the relationship. Reports that anxiety has decreased since relationship ended. "I am so much more relaxed." He reports that he has been doing intermittent fasting to help maintain stable weight. Notices his stomach is growling at times. Reports that he has had some intentional wt loss. Reports that his energy has been fair. Motivation is adequate. Denies impaired concentration. Denies SI.  Denies impulsive or risky behaviors.   Unsure of response to Buspar.  Studying for LCSW exam.    Past medication trials: Prozac-vomiting Celexa Cymbalta Viibryd BuSpar Wellbutrin Lamictal Trileptal Trazodone Doxazosin Clonidine Trazodone-excessive somnolence, vivid dreams Doxepin-ineffective for insomnia Belsomra- adverse reaction Lunesta- Helped for about 2 nights and then no longer effective.  Hydroxyzine- ineffective.  Melatonin-Some benefit but does not sleep throughout the night.    Review of Systems:  Review of Systems  Medications: I have reviewed the patient's current medications.  Current Outpatient Medications  Medication Sig Dispense Refill  . abacavir-dolutegravir-lamiVUDine (TRIUMEQ) 600-50-300 MG tablet Take 1 tablet by mouth every morning. 30 tablet 11  . busPIRone (BUSPAR) 15 MG tablet Take 1/3 tablet p.o. twice daily for 1 week, then take 2/3 tablet p.o. twice daily for 1 week, then take 1 tablet p.o. twice daily 60 tablet 2  . dolutegravir (TIVICAY) 50 MG tablet Take 1 tablet (50 mg total) by mouth at bedtime. 30 tablet 11  . fluticasone (FLONASE) 50 MCG/ACT nasal spray Place 2 sprays into both nostrils daily. 16 g 0  . ibuprofen (ADVIL,MOTRIN) 600 MG tablet Take 1 tablet (600 mg total) by mouth every 6 (six) hours as needed. 30 tablet 0  . lamoTRIgine (LAMICTAL) 200 MG tablet Take 1 tablet (200 mg total) by mouth daily. 90 tablet 1  . LYSINE PO Take by mouth.    . OXcarbazepine (TRILEPTAL) 150 MG tablet Take 1 tab po q am and 2 tabs po QHS 270 tablet 1  . rosuvastatin (CRESTOR) 10 MG tablet Take 1 tablet (10 mg total) by mouth daily. 30 tablet 2  . Vilazodone HCl (VIIBRYD) 20 MG TABS Take 1 tablet (20 mg total) by mouth daily. 90 tablet 1  . gabapentin (NEURONTIN) 100 MG capsule Take 1 capsule one hour before bedtime for 3-5 days, then may increase to 2 capsules q evening x 3-5 days, then may increase to 3 capsules po q evening 90 capsule 2  . sildenafil (VIAGRA) 25 MG tablet Take 2-4 tablets (50-100 mg total) by mouth  daily as needed for erectile dysfunction. (Patient not taking: Reported on 11/27/2018) 10 tablet 2   No current facility-administered medications for this visit.     Medication Side Effects: None  Allergies: No Known Allergies  Past Medical History:  Diagnosis Date  . Depression   . HIV infection (HCC)   . Substance abuse in remission [facility](HCC)     Family History  Problem Relation Age of Onset  . Cancer Maternal Grandmother   .  Tremor Father   . Eating disorder Sister   . Depression Sister   . Tremor Paternal Uncle   . Tremor Paternal Grandfather   . Eating disorder Sister   . Eating disorder Sister   . Arthritis Neg Hx   . Lupus Neg Hx   . Rosacea Neg Hx     Social History   Socioeconomic History  . Marital status: Single    Spouse name: Not on file  . Number of children: 0  . Years of education: 1416  . Highest education level: Not on file  Occupational History  . Occupation: Academic librarianerver  Social Needs  . Financial resource strain: Not on file  . Food insecurity:    Worry: Not on file    Inability: Not on file  . Transportation needs:    Medical: Not on file    Non-medical: Not on file  Tobacco Use  . Smoking status: Former Smoker    Packs/day: 0.75    Years: 25.00    Pack years: 18.75    Types: Cigarettes    Last attempt to quit: 03/04/2016    Years since quitting: 2.7  . Smokeless tobacco: Never Used  . Tobacco comment: wants to discuss options today  Substance and Sexual Activity  . 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 Sexual activity: Yes    Partners: Male    Birth control/protection: Condom  Lifestyle  . Physical activity:    Days per week: Not on file    Minutes per session: Not on file  . Stress: Not on file  Relationships  . Social connections:    Talks on phone: Not on file    Gets together: Not on file    Attends religious service: Not on file    Active member of club or organization: Not on file    Attends meetings of clubs or organizations: Not on file    Relationship status: Not on file  . Intimate partner violence:    Fear of current or ex partner: Not on file    Emotionally abused: Not on file    Physically abused: Not on file    Forced sexual activity: Not on file  Other Topics Concern  . Not on file  Social History Narrative   Fun: Yoga, hiking, going to the gym.     Past  Medical History, Surgical history, Social history, and Family history were reviewed and updated as appropriate.   Please see review of systems for further details on the patient's review from today.   Objective:   Physical Exam:  BP 116/76   Pulse 68   Physical Exam Constitutional:      General: He is not in acute distress.    Appearance: He is well-developed.  Musculoskeletal:        General: No deformity.  Neurological:     Mental Status: He is alert  and oriented to person, place, and time.     Coordination: Coordination normal.  Psychiatric:        Attention and Perception: Attention and perception normal. He does not perceive auditory or visual hallucinations.        Mood and Affect: Mood normal. Mood is not anxious or depressed. Affect is not labile, blunt, angry or inappropriate.        Speech: Speech normal.        Behavior: Behavior normal.        Thought Content: Thought content normal. Thought content does not include homicidal or suicidal ideation. Thought content does not include homicidal or suicidal plan.        Cognition and Memory: Cognition and memory normal.        Judgment: Judgment normal.     Comments: Insight intact. No delusions.      Lab Review:     Component Value Date/Time   NA 136 09/08/2018 1151   K 4.2 09/08/2018 1151   CL 100 09/08/2018 1151   CO2 27 09/08/2018 1151   GLUCOSE 125 (H) 09/08/2018 1151   GLUCOSE 76 02/28/2009   BUN 14 09/08/2018 1151   CREATININE 0.84 09/08/2018 1151   CALCIUM 9.7 09/08/2018 1151   PROT 7.1 09/08/2018 1151   ALBUMIN 4.6 05/29/2016 1441   AST 26 09/08/2018 1151   ALT 45 09/08/2018 1151   ALKPHOS 70 05/29/2016 1441   BILITOT 0.4 09/08/2018 1151   GFRNONAA 106 09/08/2018 1151   GFRAA 123 09/08/2018 1151       Component Value Date/Time   WBC 5.6 09/08/2018 1151   RBC 4.70 09/08/2018 1151   HGB 15.0 09/08/2018 1151   HCT 43.2 09/08/2018 1151   PLT 300 09/08/2018 1151   MCV 91.9 09/08/2018 1151   MCH  31.9 09/08/2018 1151   MCHC 34.7 09/08/2018 1151   RDW 12.7 09/08/2018 1151   LYMPHSABS 2,604 09/08/2018 1151   MONOABS 360 05/29/2016 1441   EOSABS 50 09/08/2018 1151   BASOSABS 11 09/08/2018 1151    No results found for: POCLITH, LITHIUM   No results found for: PHENYTOIN, PHENOBARB, VALPROATE, CBMZ   .res Assessment: Plan:   Discussed that patient may have restless leg syndrome since he has been told that he moves around frequently in his sleep and insomnia has been unimproved with multiple medications.  Discussed trial of gabapentin which may be helpful for legs and may also cause some drowsiness that may improve sleep as well. Discussed potential benefits, risks, and side effects of Gabapentin.  Discussed starting with low dose considering patient's history of sensitivity to some medications in the past, and then increasing as tolerated/needed.  Will start with gabapentin 100 mg at bedtime for 3 to 5 days, then increase to 200 mg at bedtime for 3 to 5 days, then may increase to 3 capsules at bedtime for restless legs and insomnia. Continue BuSpar, Trileptal, Viibryd, and Lamictal since these medications have been effective for patient's mood and anxiety.  Discussed considering possible dose reduction in medications once sleep improves. Primary insomnia - Plan: gabapentin (NEURONTIN) 100 MG capsule  Mood disorder (HCC) - Plan: lamoTRIgine (LAMICTAL) 200 MG tablet, OXcarbazepine (TRILEPTAL) 150 MG tablet, Vilazodone HCl (VIIBRYD) 20 MG TABS  PTSD (post-traumatic stress disorder) - Plan: busPIRone (BUSPAR) 15 MG tablet, Vilazodone HCl (VIIBRYD) 20 MG TABS  PTSD (post-traumatic stress disorder) - Chronic with recent increased anxiety - Plan: busPIRone (BUSPAR) 15 MG tablet, Vilazodone HCl (VIIBRYD) 20 MG TABS  Mood disorder (HCC) - Chronic with recent increase in depressive s/s. - Plan: lamoTRIgine (LAMICTAL) 200 MG tablet, OXcarbazepine (TRILEPTAL) 150 MG tablet, Vilazodone HCl (VIIBRYD)  20 MG TABS  Please see After Visit Summary for patient specific instructions.  Future Appointments  Date Time Provider Department Center  12/22/2018  9:00 AM [facility]  01/12/2019  9:00 AM Blanchard Kelchixon, Stephanie N, NP [facility]  02/25/2019 11:30 AM Corie Chiquitoarter, Austine Kelsay, PMHNP [facility] None    No orders of the defined types were placed in this encounter.     -------------------------------

## 2018-11-30 ENCOUNTER — Encounter: Payer: Self-pay | Admitting: Psychiatry

## 2018-12-03 NOTE — Progress Notes (Addendum)
Cardiology Office Note    Date:  12/04/2018   ID:  Frank Webster, DOB 1976-05-16, MRN [id number]  PCP:  Levonne Lappingann, Samandra, NP  Cardiologist:  Armanda Magicraci Turner, MD   Chief Complaint  Patient presents with  . Hyperlipidemia    History of Present Illness:  Frank BatmanGuillermo Perriello is a 43 y.o. male who is being seen today for the evaluation of hyperlipidemia at the request of Blanchard Kelchixon, Stephanie N, NP.  This is a 43yo male with a history of HIV who was diagnosed with hyperlipidemia.  Apparently he has a history of familial hyperlipidemia and is now here for further evaluation.  His last cholesterol 08/2018 showed LDL 203, HDL 35, Tag 143.  He is here today for followup and is doing well.  He denies any chest pain or pressure, SOB, DOE, PND, orthopnea, LE edema, dizziness, palpitations or syncope. He is compliant with his meds and is tolerating meds with no SE.    Past Medical History:  Diagnosis Date  . Anxiety state 04/05/2016  . CANDIDIASIS, ORAL 10/11/2010   Qualifier: Diagnosis of  By: Sundra AlandFarrington NP, Malvin JohnsBradford    . Decreased libido 04/05/2016  . Depression, major (HCC) 10/31/2010   Qualifier: Diagnosis of  By: Sundra AlandFarrington NP, Malvin JohnsBradford    . Dermatophytosis of unspecified site 10/24/2010   Qualifier: Diagnosis of  By: Sundra AlandFarrington NP, Malvin JohnsBradford    . Erectile dysfunction 09/26/2017  . FOLLICULITIS 10/24/2010   Qualifier: Diagnosis of  By: Sundra AlandFarrington NP, Malvin JohnsBradford    . Glossitis 10/03/2010   Qualifier: Diagnosis of  By: Sundra AlandFarrington NP, Malvin JohnsBradford    . Hemorrhoid 09/29/2012  . HIV infection (HCC)   . Inflamed seborrheic keratosis 10/11/2010   Qualifier: Diagnosis of  By: Sundra AlandFarrington NP, Malvin JohnsBradford    . Left shoulder pain 03/17/2015  . Nonallopathic lesion of cervical region 04/14/2015  . Nonallopathic lesion of lumbosacral region 04/14/2015  . Nonallopathic lesion of thoracic region 04/14/2015  . Other specified disorder of rectum and anus 10/03/2010   Qualifier: Diagnosis of  By: Sundra AlandFarrington NP, Malvin JohnsBradford    .  OTITIS EXTERNA 06/14/2010   Qualifier: Diagnosis of  By: Philipp DeputyVollmer MD, Tresa EndoKelly    . Pruritus ani 10/24/2010   Qualifier: Diagnosis of  By: Sundra AlandFarrington NP, Malvin JohnsBradford    . Racing heart beat 04/21/2015  . Scapular dyskinesis 03/24/2015  . Seasonal allergies 01/27/2014  . Sprain of acromioclavicular joint 03/17/2015  . STD exposure 09/26/2017  . Substance abuse (HCC) 01/27/2014  . Substance abuse in remission (HCC)   . Testicular/scrotal pain 04/21/2015  . Tobacco abuse 03/22/2015  . URI 10/31/2010   Qualifier: Diagnosis of  By: Sundra AlandFarrington NP, Malvin JohnsBradford    . VINCENTS ANGINA 10/03/2010   Qualifier: Diagnosis of  By: Sundra AlandFarrington NP, Malvin JohnsBradford      Past Surgical History:  Procedure Laterality Date  . TESTICLE TORSION REDUCTION      Current Medications: Current Meds  Medication Sig  . abacavir-dolutegravir-lamiVUDine (TRIUMEQ) 600-50-300 MG tablet Take 1 tablet by mouth every morning.  . busPIRone (BUSPAR) 15 MG tablet Take 1/3 tablet p.o. twice daily for 1 week, then take 2/3 tablet p.o. twice daily for 1 week, then take 1 tablet p.o. twice daily  . dolutegravir (TIVICAY) 50 MG tablet Take 1 tablet (50 mg total) by mouth at bedtime.  . fluticasone (FLONASE) 50 MCG/ACT nasal spray Place 2 sprays into both nostrils daily.  Marland Kitchen. gabapentin (NEURONTIN) 100 MG capsule Take 1 capsule one hour before bedtime for 3-5 days, then may increase to 2  capsules q evening x 3-5 days, then may increase to 3 capsules po q evening  . ibuprofen (ADVIL,MOTRIN) 600 MG tablet Take 1 tablet (600 mg total) by mouth every 6 (six) hours as needed.  . lamoTRIgine (LAMICTAL) 200 MG tablet Take 1 tablet (200 mg total) by mouth daily.  Marland Kitchen LYSINE PO Take by mouth.  . OXcarbazepine (TRILEPTAL) 150 MG tablet Take 1 tab po q am and 2 tabs po QHS  . rosuvastatin (CRESTOR) 10 MG tablet Take 1 tablet (10 mg total) by mouth daily.  . sildenafil (VIAGRA) 25 MG tablet Take 2-4 tablets (50-100 mg total) by mouth daily as needed for erectile dysfunction.    . Vilazodone HCl (VIIBRYD) 20 MG TABS Take 1 tablet (20 mg total) by mouth daily.    Allergies:   Patient has no known allergies.   Social History   Socioeconomic History  . Marital status: Single    Spouse name: Not on file  . Number of children: 0  . Years of education: 84  . Highest education level: Not on file  Occupational History  . Occupation: Academic librarian  . Financial resource strain: Not on file  . Food insecurity:    Worry: Not on file    Inability: Not on file  . Transportation needs:    Medical: Not on file    Non-medical: Not on file  Tobacco Use  . Smoking status: Former Smoker    Packs/day: 0.75    Years: 25.00    Pack years: 18.75    Types: Cigarettes    Last attempt to quit: 03/04/2016    Years since quitting: 2.7  . Smokeless tobacco: Never Used  Substance and Sexual Activity  . 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 Sexual activity: Yes    Partners: Male    Birth control/protection: Condom  Lifestyle  . Physical activity:    Days per week: Not on file    Minutes per session: Not on file  . Stress: Not on file  Relationships  . Social connections:    Talks on phone: Not on file    Gets together: Not on file    Attends religious service: Not on file    Active member of club or organization: Not on file    Attends meetings of clubs or organizations: Not on file    Relationship status: Not on file  Other Topics Concern  . Not on file  Social History Narrative   Fun: Yoga, hiking, going to the gym.      Family History:  The patient's family history includes Cancer in his maternal grandmother; Depression in his sister; Eating disorder in his sister, sister, and sister; Tremor in his father, paternal grandfather, and paternal uncle.   ROS:   Please see the history of present illness.    ROS All other systems reviewed and are negative.  No flowsheet  data found.     PHYSICAL EXAM:   VS:  BP 108/70   Pulse 62   Ht 5\' 8"  (1.727 m)   Wt 153 lb 9.6 oz (69.7 kg)   SpO2 98%   BMI 23.35 kg/m    GEN: Well nourished, well developed, in no acute distress  HEENT: normal  Neck: no JVD, carotid bruits, or masses Cardiac: RRR; no murmurs, rubs, or gallops,no edema.  Intact distal pulses  bilaterally.  Respiratory:  clear to auscultation bilaterally, normal work of breathing GI: soft, nontender, nondistended, + BS MS: no deformity or atrophy  Skin: warm and dry, no rash Neuro:  Alert and Oriented x 3, Strength and sensation are intact Psych: euthymic mood, full affect  Wt Readings from Last 3 Encounters:  12/04/18 153 lb 9.6 oz (69.7 kg)  09/22/18 160 lb (72.6 kg)  04/07/18 150 lb (68 kg)      Studies/Labs Reviewed:   EKG:  EKG is ordered today.  The ekg ordered today demonstrates NSR with no ST changes  Recent Labs: 09/08/2018: ALT 45; BUN 14; Creat 0.84; Hemoglobin 15.0; Platelets 300; Potassium 4.2; Sodium 136   Lipid Panel    Component Value Date/Time   CHOL 268 (H) 09/08/2018 1151   TRIG 143 09/08/2018 1151   HDL 35 (L) 09/08/2018 1151   CHOLHDL 7.7 (H) 09/08/2018 1151   VLDL 20 05/29/2016 1441   LDLCALC 203 (H) 09/08/2018 1151    Additional studies/ records that were reviewed today include:  Office notes from PCP    ASSESSMENT:    1. Familial hyperlipidemia   2. Pure hypercholesterolemia      PLAN:  In order of problems listed above:  1. Familial Hyperlipidemia - his LDL is very high and is now on Crestor 10mg  daily. He has repeat labs scheduled for 3/9.  I will refer to Dr. Rennis Golden for further evaluation I mid march after his labs are back.  I will get a baseline ETT to rule out ischemia.     Medication Adjustments/Labs and Tests Ordered: Current medicines are reviewed at length with the patient today.  Concerns regarding medicines are outlined above.  Medication changes, Labs and Tests ordered today are  listed in the Patient Instructions below.  Patient Instructions  Medication Instructions:  Your physician recommends that you continue on your current medications as directed. Please refer to the Current Medication list given to you today.  If you need a refill on your cardiac medications before your next appointment, please call your pharmacy.   Lab work: None If you have labs (blood work) drawn today and your tests are completely normal, you will receive your results only by: Marland Kitchen MyChart Message (if you have MyChart) OR . A paper copy in the mail If you have any lab test that is abnormal or we need to change your treatment, we will call you to review the results.  Testing/Procedures: Your physician has requested that you have an exercise tolerance test. For further information please visit [email or web address] Please also follow instruction sheet, as given.  Follow-Up: As needed.  You have been referred to Lipid Clinic with Dr. Rennis Golden after March 15.        Signed, Armanda Magic, MD  12/04/2018 9:33 AM    [facility] [address] [place], [place], [place]  [address] Phone: [phone]; Fax: [phone]

## 2018-12-04 ENCOUNTER — Encounter: Payer: Self-pay | Admitting: Cardiology

## 2018-12-04 ENCOUNTER — Ambulatory Visit: Payer: BC Managed Care – PPO | Admitting: Cardiology

## 2018-12-04 VITALS — BP 108/70 | HR 62 | Ht 68.0 in | Wt 153.6 lb

## 2018-12-04 DIAGNOSIS — E7800 Pure hypercholesterolemia, unspecified: Secondary | ICD-10-CM

## 2018-12-04 DIAGNOSIS — E7849 Other hyperlipidemia: Secondary | ICD-10-CM | POA: Diagnosis not present

## 2018-12-04 NOTE — Patient Instructions (Signed)
Medication Instructions:  Your physician recommends that you continue on your current medications as directed. Please refer to the Current Medication list given to you today.  If you need a refill on your cardiac medications before your next appointment, please call your pharmacy.   Lab work: None If you have labs (blood work) drawn today and your tests are completely normal, you will receive your results only by: Marland Kitchen MyChart Message (if you have MyChart) OR . A paper copy in the mail If you have any lab test that is abnormal or we need to change your treatment, we will call you to review the results.  Testing/Procedures: Your physician has requested that you have an exercise tolerance test. For further information please visit [email or web address] Please also follow instruction sheet, as given.  Follow-Up: As needed.  You have been referred to Lipid Clinic with Dr. Rennis Golden after March 15.

## 2018-12-07 ENCOUNTER — Other Ambulatory Visit: Payer: Self-pay | Admitting: Infectious Diseases

## 2018-12-11 ENCOUNTER — Ambulatory Visit (INDEPENDENT_AMBULATORY_CARE_PROVIDER_SITE_OTHER): Payer: BC Managed Care – PPO

## 2018-12-11 DIAGNOSIS — E7800 Pure hypercholesterolemia, unspecified: Secondary | ICD-10-CM | POA: Diagnosis not present

## 2018-12-11 LAB — EXERCISE TOLERANCE TEST
Estimated workload: 17.2 METS
Exercise duration (min): 13 min
Exercise duration (sec): 0 s
MPHR: 176 {beats}/min
Peak HR: 171 {beats}/min
Percent HR: 97 %
RPE: 17
Rest HR: 83 {beats}/min

## 2018-12-22 ENCOUNTER — Other Ambulatory Visit: Payer: BC Managed Care – PPO

## 2018-12-22 DIAGNOSIS — B20 Human immunodeficiency virus [HIV] disease: Secondary | ICD-10-CM

## 2018-12-23 LAB — T-HELPER CELL (CD4) - (RCID CLINIC ONLY)
CD4 % Helper T Cell: 44 % (ref 33–55)
CD4 T Cell Abs: 1260 /uL (ref 400–2700)

## 2018-12-24 LAB — LIPID PANEL
Cholesterol: 172 mg/dL (ref <200)
HDL: 35 mg/dL — ABNORMAL LOW (ref >=40)
LDL Cholesterol (Calc): 121 mg/dL (calc) — ABNORMAL HIGH
Non-HDL Cholesterol (Calc): 137 mg/dL (calc) — ABNORMAL HIGH (ref <130)
Total CHOL/HDL Ratio: 4.9 (calc) (ref <5.0)
Triglycerides: 72 mg/dL (ref <150)

## 2018-12-24 LAB — HEMOGLOBIN A1C
HEMOGLOBIN A1C: 5.4 %{Hb} (ref <5.7)
MEAN PLASMA GLUCOSE: 108 (calc)
eAG (mmol/L): 6 (calc)

## 2018-12-24 LAB — HIV-1 RNA QUANT-NO REFLEX-BLD
HIV 1 RNA Quant: 56 copies/mL — ABNORMAL HIGH
HIV-1 RNA QUANT, LOG: 1.75 {Log_copies}/mL — AB

## 2019-01-12 ENCOUNTER — Encounter: Payer: BC Managed Care – PPO | Admitting: Infectious Diseases

## 2019-01-16 ENCOUNTER — Ambulatory Visit: Payer: BC Managed Care – PPO | Admitting: Internal Medicine

## 2019-02-25 ENCOUNTER — Ambulatory Visit (INDEPENDENT_AMBULATORY_CARE_PROVIDER_SITE_OTHER): Payer: BC Managed Care – PPO | Admitting: Psychiatry

## 2019-02-25 ENCOUNTER — Encounter: Payer: Self-pay | Admitting: Psychiatry

## 2019-02-25 ENCOUNTER — Other Ambulatory Visit: Payer: Self-pay

## 2019-02-25 DIAGNOSIS — F39 Unspecified mood [affective] disorder: Secondary | ICD-10-CM | POA: Diagnosis not present

## 2019-02-25 DIAGNOSIS — F5101 Primary insomnia: Secondary | ICD-10-CM

## 2019-02-25 DIAGNOSIS — F4310 Post-traumatic stress disorder, unspecified: Secondary | ICD-10-CM | POA: Diagnosis not present

## 2019-02-25 MED ORDER — OXCARBAZEPINE 150 MG PO TABS: ORAL_TABLET | ORAL | 1 refills | Status: DC

## 2019-02-25 MED ORDER — BUSPIRONE HCL 30 MG PO TABS: ORAL_TABLET | ORAL | 1 refills | Status: DC

## 2019-02-25 NOTE — Progress Notes (Signed)
Courage Ruby [id number] 02/01/1976 43 y.o.  Virtual Visit via Telephone Note  I connected with pt on 02/25/19 at 11:30 AM EDT by telephone and verified that I am speaking with the correct person using two identifiers.   I discussed the limitations, risks, security and privacy concerns of performing an evaluation and management service by telephone and the availability of in person appointments. I also discussed with the patient that there may be a patient responsible charge related to this service. The patient expressed understanding and agreed to proceed.   I discussed the assessment and treatment plan with the patient. The patient was provided an opportunity to ask questions and all were answered. The patient agreed with the plan and demonstrated an understanding of the instructions.   The patient was advised to call back or seek an in-person evaluation if the symptoms worsen or if the condition fails to improve as anticipated.  I provided 30 minutes of non-face-to-face time during this encounter.  The patient was located at home.  The provider was located at home.   Corie Chiquito, PMHNP   Subjective:   Patient ID:  Frank Webster is a 43 y.o. (DOB 10/20/1975) male.  Chief Complaint:  Chief Complaint  Patient presents with  . Anxiety  . Depression  . Insomnia    HPI Frank Webster presents for follow-up of insomnia, mood, and anxiety. Reports that he has been having some home renovations and had issues with this to include project costing more, taking longer, and not turning out as expected. Reports that this caused him increased anxiety. Reports that at times he was having some obsessive thoughts around the rumination. Reports that this also caused distractions while trying to work from home. He feels that his anxiety has been manageable aside from occasionally reacting in ways that he wishes he would have reacted differently. Denies any panic s/s.  He reports that he has  had episodic sadness in response to "botched" renovations. Denies persistent sad mood. Denies any manic s/s aside from excessive spending for bathroom renovations that has created some debt.  He denies any change in sleep. He reports that he continues to wake up multiple times a night and does not feel rested upon awakening. Reports that he has not been as motivated recently. Reports low energy at the start of the day. Appetite has been good. He reports that he has been intentionally trying to lose weight and was doing intermittent fasting. He is no longer fasting intermittently and is now eating smaller portions. He reports that his concentration is adequate once he starts a task. Reports that he had a virtual visit with sleep specialist who recommended sleep schedule for 7 hours. He reports that he has been having a hard time with set sleep schedule. For example, some days he will feel tired and want to fall asleep before 11 pm. Other times is wanting to watch TV in bed. Denies SI.   Able to work remotely. 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   Past medication trials: Prozac-vomiting Celexa Cymbalta Viibryd BuSpar- Has been helpful for anxiety Wellbutrin Lamictal Trileptal Trazodone Doxazosin Clonidine Trazodone-excessive somnolence, vivid dreams Doxepin-ineffective for insomnia Belsomra- adverse reaction Lunesta- Helped for about 2 nights and then no longer effective.  Hydroxyzine- ineffective. Melatonin-Some benefit but does not sleep throughout the night.  Gabapentin   Review of Systems:  Review of Systems  Musculoskeletal: Negative for gait problem.  Neurological: Negative for tremors and headaches.  Psychiatric/Behavioral:  Please refer to HPI    Medications: I have reviewed the patient's current medications.  Current Outpatient Medications  Medication Sig Dispense Refill  . abacavir-dolutegravir-lamiVUDine (TRIUMEQ) 600-50-300 MG tablet Take 1  tablet by mouth every morning. 30 tablet 11  . busPIRone (BUSPAR) 15 MG tablet Take 1/3 tablet p.o. twice daily for 1 week, then take 2/3 tablet p.o. twice daily for 1 week, then take 1 tablet p.o. twice daily 60 tablet 2  . cetirizine (ZYRTEC) 10 MG tablet Take 10 mg by mouth daily.    . dolutegravir (TIVICAY) 50 MG tablet Take 1 tablet (50 mg total) by mouth at bedtime. 30 tablet 11  . fluticasone (FLONASE) 50 MCG/ACT nasal spray Place 2 sprays into both nostrils daily. 16 g 0  . ibuprofen (ADVIL,MOTRIN) 600 MG tablet Take 1 tablet (600 mg total) by mouth every 6 (six) hours as needed. 30 tablet 0  . lamoTRIgine (LAMICTAL) 200 MG tablet Take 1 tablet (200 mg total) by mouth daily. 90 tablet 1  . LYSINE PO Take by mouth.    . Multiple Vitamins-Minerals (CENTRUM MEN PO) Take by mouth.    . OXcarbazepine (TRILEPTAL) 150 MG tablet Take 2 tabs at bedtime only for 5 days, then 1 tab at bedtime for 5 days, then stop 270 tablet 1  . Probiotic Product (PROBIOTIC PO) Take by mouth.    . rosuvastatin (CRESTOR) 10 MG tablet TAKE 1 TABLET(10 MG) BY MOUTH DAILY 30 tablet 5  . sildenafil (VIAGRA) 25 MG tablet Take 2-4 tablets (50-100 mg total) by mouth daily as needed for erectile dysfunction. 10 tablet 2  . Vilazodone HCl (VIIBRYD) 20 MG TABS Take 1 tablet (20 mg total) by mouth daily. 90 tablet 1  . busPIRone (BUSPAR) 30 MG tablet Take 2/3 tablet BID x 1 week, then increase 1 tab po BID 60 tablet 1   No current facility-administered medications for this visit.     Medication Side Effects: None  Allergies: No Known Allergies  Past Medical History:  Diagnosis Date  . Anxiety state 04/05/2016  . CANDIDIASIS, ORAL 10/11/2010   Qualifier: Diagnosis of  By: [name] NP, Malvin Johns    . Decreased libido 04/05/2016  . Depression, major (HCC) 10/31/2010   Qualifier: Diagnosis of  By: [name] NP, Malvin Johns    . Dermatophytosis of unspecified site 10/24/2010   Qualifier: Diagnosis of  By: [name] NP,  Malvin Johns    . Erectile dysfunction 09/26/2017  . FOLLICULITIS 10/24/2010   Qualifier: Diagnosis of  By: [name] NP, Malvin Johns    . Glossitis 10/03/2010   Qualifier: Diagnosis of  By: [name] NP, Malvin Johns    . Hemorrhoid 09/29/2012  . HIV infection (HCC)   . Inflamed seborrheic keratosis 10/11/2010   Qualifier: Diagnosis of  By: [name] NP, Malvin Johns    . Left shoulder pain 03/17/2015  . Nonallopathic lesion of cervical region 04/14/2015  . Nonallopathic lesion of lumbosacral region 04/14/2015  . Nonallopathic lesion of thoracic region 04/14/2015  . Other specified disorder of rectum and anus 10/03/2010   Qualifier: Diagnosis of  By: [name] NP, Malvin Johns    . OTITIS EXTERNA 06/14/2010   Qualifier: Diagnosis of  By: Philipp Deputy MD, Tresa Endo    . Pruritus ani 10/24/2010   Qualifier: Diagnosis of  By: [name] NP, Malvin Johns    . Racing heart beat 04/21/2015  . Scapular dyskinesis 03/24/2015  . Seasonal allergies 01/27/2014  . Sprain of acromioclavicular joint 03/17/2015  . STD exposure 09/26/2017  . Substance abuse (HCC) 01/27/2014  .  Substance abuse in remission (HCC)   . Testicular/scrotal pain 04/21/2015  . Tobacco abuse 03/22/2015  . URI 10/31/2010   Qualifier: Diagnosis of  By: [name] NP, Malvin Johns    . 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 Cancer Maternal Grandmother   . Tremor Father   . Eating disorder Sister   . Depression Sister   . Tremor Paternal Uncle   . Tremor Paternal Grandfather   . Eating disorder Sister   . Eating disorder Sister   . Arthritis Neg Hx   . Lupus Neg Hx   . Rosacea Neg Hx     Social History   Socioeconomic History  . Marital status: Single    Spouse name: Not on file  . Number of children: 0  . Years of education: 88  . Highest education level: Not on file  Occupational History  . Occupation: Academic librarian  . Financial resource strain:  Not on file  . Food insecurity:    Worry: Not on file    Inability: Not on file  . Transportation needs:    Medical: Not on file    Non-medical: Not on file  Tobacco Use  . Smoking status: Former Smoker    Packs/day: 0.75    Years: 25.00    Pack years: 18.75    Types: Cigarettes    Last attempt to quit: 03/04/2016    Years since quitting: 2.9  . Smokeless tobacco: Never Used  Substance and Sexual Activity  . 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 Sexual activity: Yes    Partners: Male    Birth control/protection: Condom  Lifestyle  . Physical activity:    Days per week: Not on file    Minutes per session: Not on file  . Stress: Not on file  Relationships  . Social connections:    Talks on phone: Not on file    Gets together: Not on file    Attends religious service: Not on file    Active member of club or organization: Not on file    Attends meetings of clubs or organizations: Not on file    Relationship status: Not on file  . Intimate partner violence:    Fear of current or ex partner: Not on file    Emotionally abused: Not on file    Physically abused: Not on file    Forced sexual activity: Not on file  Other Topics Concern  . Not on file  Social History Narrative   Fun: Yoga, hiking, going to the gym.     Past Medical History, Surgical history, Social history, and Family history were reviewed and updated as appropriate.   Please see review of systems for further details on the patient's review from today.   Objective:   Physical Exam:  There were no vitals taken for this visit.  Physical Exam Neurological:     Mental Status: He is alert and oriented to person, place, and time.     Cranial Nerves: No dysarthria.  Psychiatric:        Attention and Perception: Attention normal.        Mood and Affect: Mood is anxious.        Speech: Speech normal.  Behavior: Behavior  is cooperative.        Thought Content: Thought content normal. Thought content is not paranoid or delusional. Thought content does not include homicidal or suicidal ideation. Thought content does not include homicidal or suicidal plan.        Cognition and Memory: Cognition and memory normal.        Judgment: Judgment normal.     Lab Review:     Component Value Date/Time   NA 136 09/08/2018 1151   K 4.2 09/08/2018 1151   CL 100 09/08/2018 1151   CO2 27 09/08/2018 1151   GLUCOSE 125 (H) 09/08/2018 1151   GLUCOSE 76 02/28/2009   BUN 14 09/08/2018 1151   CREATININE 0.84 09/08/2018 1151   CALCIUM 9.7 09/08/2018 1151   PROT 7.1 09/08/2018 1151   ALBUMIN 4.6 05/29/2016 1441   AST 26 09/08/2018 1151   ALT 45 09/08/2018 1151   ALKPHOS 70 05/29/2016 1441   BILITOT 0.4 09/08/2018 1151   GFRNONAA 106 09/08/2018 1151   GFRAA 123 09/08/2018 1151       Component Value Date/Time   WBC 5.6 09/08/2018 1151   RBC 4.70 09/08/2018 1151   HGB 15.0 09/08/2018 1151   HCT 43.2 09/08/2018 1151   PLT 300 09/08/2018 1151   MCV 91.9 09/08/2018 1151   MCH 31.9 09/08/2018 1151   MCHC 34.7 09/08/2018 1151   RDW 12.7 09/08/2018 1151   LYMPHSABS 2,604 09/08/2018 1151   MONOABS 360 05/29/2016 1441   EOSABS 50 09/08/2018 1151   BASOSABS 11 09/08/2018 1151    No results found for: POCLITH, LITHIUM   No results found for: PHENYTOIN, PHENOBARB, VALPROATE, CBMZ   .res Assessment: Plan:   Discussed treatment options with patient and he reports that he would like to try to decrease number of medications and decrease anxiety.  Discussed potential benefits, risks, and side effects of increasing BuSpar and patient agrees to increasing dose of BuSpar.  Will increase BuSpar to 20 mg twice daily for 1 week, then increase to 30 mg twice a day for anxiety since patient reports partial response at lower doses. Discussed decreasing Trileptal since patient is unsure if this has been helpful for his mood or anxiety  signs and symptoms.  Will decrease Trileptal to 300 mg at bedtime for 5 days, then 150 mg at bedtime for 5 days, then stop.  Recommended that he notify [facility] disease specialist that he is decreasing and discontinuing Trileptal since it decreases blood levels of Tivicay.  Continue Viibryd 20 mg daily for depression and anxiety. Continue Lamictal 200 mg daily for mood signs and symptoms. Patient follow-up in 6 weeks or sooner if clinically indicated. Patient advised to contact office with any questions, adverse effects, or acute worsening in signs and symptoms.  PTSD (post-traumatic stress disorder) - Plan: busPIRone (BUSPAR) 30 MG tablet  Mood disorder (HCC) - Plan: OXcarbazepine (TRILEPTAL) 150 MG tablet  Please see After Visit Summary for patient specific instructions.  No future appointments.  No orders of the defined types were placed in this encounter.     -------------------------------

## 2019-03-02 ENCOUNTER — Telehealth: Payer: Self-pay | Admitting: Psychiatry

## 2019-03-02 NOTE — Telephone Encounter (Signed)
Returned call to Dr. Benjaman Kindler who reports that he is considering using low dose Klonopin to improve pt's insomnia and wanted to discuss this option considering pt's h/o substance abuse. Agreed that this seems to be an appropriate option for pt since he is further into his recovery, remains active in recovery support, has been forthcoming about concerns of avoiding any medications with addiction potential, and has stopped medications that have not been effective. Will likely initiate Klonopin at 0.25 mg po QHS and consider increasing to 0.5 mg if needed. Will contact Dr. Earl Gala with any concerns in the future.

## 2019-03-02 NOTE — Telephone Encounter (Signed)
Dr. Earl Gala patient's sleep Doctor would like for you to contact him regarding patient.

## 2019-03-09 ENCOUNTER — Other Ambulatory Visit: Payer: Self-pay | Admitting: Psychiatry

## 2019-03-09 DIAGNOSIS — F4310 Post-traumatic stress disorder, unspecified: Secondary | ICD-10-CM

## 2019-03-25 ENCOUNTER — Other Ambulatory Visit: Payer: Self-pay

## 2019-03-25 DIAGNOSIS — B20 Human immunodeficiency virus [HIV] disease: Secondary | ICD-10-CM

## 2019-03-25 MED ORDER — ABACAVIR-DOLUTEGRAVIR-LAMIVUD 600-50-300 MG PO TABS: 1.0000 | ORAL_TABLET | Freq: Every morning | ORAL | 2 refills | Status: DC

## 2019-04-08 ENCOUNTER — Telehealth: Payer: Self-pay | Admitting: Infectious Diseases

## 2019-04-08 NOTE — Telephone Encounter (Signed)
COVID-19 Pre-Screening Questions: ° °Do you currently have a fever (>100 °F), chills or unexplained body aches? No  ° °Are you currently experiencing new cough, shortness of breath, sore throat, runny nose? No  °•  °Have you recently travelled outside the state of [place] in the last 14 days? No  °•  °1. Have you been in contact with someone that is currently pending confirmation of Covid19 testing or has been confirmed to have the Covid19 virus?  No  ° °

## 2019-04-09 ENCOUNTER — Other Ambulatory Visit: Payer: Self-pay

## 2019-04-09 ENCOUNTER — Other Ambulatory Visit (HOSPITAL_COMMUNITY)
Admission: RE | Admit: 2019-04-09 | Discharge: 2019-04-09 | Disposition: A | Discharge status: Home / Self Care | Payer: BC Managed Care – PPO | Source: Ambulatory Visit | Attending: Infectious Diseases | Admitting: Infectious Diseases

## 2019-04-09 ENCOUNTER — Encounter: Payer: Self-pay | Admitting: Infectious Diseases

## 2019-04-09 ENCOUNTER — Ambulatory Visit (INDEPENDENT_AMBULATORY_CARE_PROVIDER_SITE_OTHER): Payer: BC Managed Care – PPO | Admitting: Infectious Diseases

## 2019-04-09 VITALS — BP 123/75 | HR 80 | Temp 98.4°F | Wt 138.0 lb

## 2019-04-09 DIAGNOSIS — Z113 Encounter for screening for infections with a predominantly sexual mode of transmission: Secondary | ICD-10-CM | POA: Insufficient documentation

## 2019-04-09 DIAGNOSIS — E7801 Familial hypercholesterolemia: Secondary | ICD-10-CM

## 2019-04-09 DIAGNOSIS — Z79899 Other long term (current) drug therapy: Secondary | ICD-10-CM

## 2019-04-09 DIAGNOSIS — Z21 Asymptomatic human immunodeficiency virus [HIV] infection status: Secondary | ICD-10-CM

## 2019-04-09 DIAGNOSIS — F332 Major depressive disorder, recurrent severe without psychotic features: Secondary | ICD-10-CM | POA: Diagnosis not present

## 2019-04-09 DIAGNOSIS — B20 Human immunodeficiency virus [HIV] disease: Secondary | ICD-10-CM

## 2019-04-09 NOTE — Patient Instructions (Addendum)
Always nice to see you!  Will have you continue your Triumeq once a day as you are taking now.   Will see you back in 6 months with labs 2 weeks before.

## 2019-04-09 NOTE — Progress Notes (Signed)
Name: Frank Webster   DOB: 04-05-1976   MRN: [id number]   PCP: Levonne Lappingann, Samandra, NP (Inactive)   Reason for Visit: Routine HIV Care  Brief Narrative: Frank Webster is a 43 y.o. male with HIV disease. Has seen Dr. Luciana Axeomer in the clinic and previously well controlled with low level viremia < 100 copies the last few draws. Diagnosed >22 years ago; had a time where he struggled with adherence 2/2 drug use. OI Hx: none.  HIV Risk: MSM, previous drug use.   Previous Regimens:   Genvoya    Triumeq + Tivicay PM (DDI with Trileptel)  Triumeq >> suppressed   Genotype:   01/2014 - no INSTI resistance, wild type   Patient Active Problem List   Diagnosis Date Noted  . Anxiety state 04/05/2016  . Decreased libido 04/05/2016  . Hyperlipidemia 04/21/2015  . Nonallopathic lesion of thoracic region 04/14/2015  . Scapular dyskinesis 03/24/2015  . Health care maintenance 02/15/2015  . Screening examination for venereal disease 09/30/2014  . Seasonal allergies 01/27/2014  . Hemorrhoid 09/29/2012  . Depression, major (HCC) 10/31/2010  . Human immunodeficiency virus (HIV) disease (HCC) 02/23/2010    CC:  Frank Webster is here today for routine follow up care of his HIV. He has had a few changes to his medications from his mental health provider. Still in counseling/therapy. Doing well overall.   HPI: Reports 100% adherence to Triumeq without concern for side effects or access to his medications. He is now off of trileptal and we discussed over a previous MyChart message to stop the PM tivicay which he did do. He has no concerns today with regards to his HIV care. Requesting STI screening today. Reports insertive anal and oral intercourse with multiple male partners. No symptoms but has required treatment for colonization before.   He is now taking clonazapam as directed by his mental health team - did not notice much with 0.25 mg but now on 0.5 mg and finally for the first time in 4 years he has started  to get some better quality sleep. Continues to work with therapist.    Review of Systems  Constitutional: Negative for chills, fever, malaise/fatigue and weight loss.  HENT: Negative for sore throat.        No dental problems  Respiratory: Negative for cough and sputum production.   Cardiovascular: Negative for chest pain and leg swelling.  Gastrointestinal: Negative for abdominal pain, diarrhea and vomiting.  Genitourinary: Negative for dysuria and flank pain.  Musculoskeletal: Negative for joint pain, myalgias and neck pain.  Skin: Negative for rash.  Neurological: Negative for dizziness, tingling and headaches.  Psychiatric/Behavioral: Positive for depression. Negative for substance abuse and suicidal ideas. The patient is nervous/anxious. The patient does not have insomnia.     Past Medical History:  Diagnosis Date  . Anxiety state 04/05/2016  . CANDIDIASIS, ORAL 10/11/2010   Qualifier: Diagnosis of  By: Sundra AlandFarrington NP, Malvin JohnsBradford    . Decreased libido 04/05/2016  . Depression, major (HCC) 10/31/2010   Qualifier: Diagnosis of  By: Sundra AlandFarrington NP, Malvin JohnsBradford    . Dermatophytosis of unspecified site 10/24/2010   Qualifier: Diagnosis of  By: Sundra AlandFarrington NP, Malvin JohnsBradford    . Erectile dysfunction 09/26/2017  . FOLLICULITIS 10/24/2010   Qualifier: Diagnosis of  By: Sundra AlandFarrington NP, Malvin JohnsBradford    . Glossitis 10/03/2010   Qualifier: Diagnosis of  By: Sundra AlandFarrington NP, Malvin JohnsBradford    . Hemorrhoid 09/29/2012  . HIV infection (HCC)   . Inflamed seborrheic keratosis 10/11/2010   Qualifier:  Diagnosis of  By: Sundra AlandFarrington NP, Malvin JohnsBradford    . Left shoulder pain 03/17/2015  . Nonallopathic lesion of cervical region 04/14/2015  . Nonallopathic lesion of lumbosacral region 04/14/2015  . Nonallopathic lesion of thoracic region 04/14/2015  . Other specified disorder of rectum and anus 10/03/2010   Qualifier: Diagnosis of  By: Sundra AlandFarrington NP, Malvin JohnsBradford    . OTITIS EXTERNA 06/14/2010   Qualifier: Diagnosis of  By: Philipp DeputyVollmer MD,  Tresa EndoKelly    . Pruritus ani 10/24/2010   Qualifier: Diagnosis of  By: Sundra AlandFarrington NP, Malvin JohnsBradford    . Racing heart beat 04/21/2015  . Scapular dyskinesis 03/24/2015  . Seasonal allergies 01/27/2014  . Sprain of acromioclavicular joint 03/17/2015  . STD exposure 09/26/2017  . Substance abuse (HCC) 01/27/2014  . Substance abuse in remission (HCC)   . Testicular/scrotal pain 04/21/2015  . Tobacco abuse 03/22/2015  . URI 10/31/2010   Qualifier: Diagnosis of  By: Sundra AlandFarrington NP, Malvin JohnsBradford    . VINCENTS ANGINA 10/03/2010   Qualifier: Diagnosis of  By: Sundra AlandFarrington NP, Malvin JohnsBradford     No Known Allergies  Outpatient Medications Prior to Visit  Medication Sig Dispense Refill  . abacavir-dolutegravir-lamiVUDine (TRIUMEQ) 600-50-300 MG tablet Take 1 tablet by mouth every morning. 30 tablet 2  . busPIRone (BUSPAR) 30 MG tablet Take 2/3 tablet BID x 1 week, then increase 1 tab po BID 60 tablet 1  . cetirizine (ZYRTEC) 10 MG tablet Take 10 mg by mouth daily.    Marland Kitchen. ibuprofen (ADVIL,MOTRIN) 600 MG tablet Take 1 tablet (600 mg total) by mouth every 6 (six) hours as needed. 30 tablet 0  . LYSINE PO Take by mouth.    . Multiple Vitamins-Minerals (CENTRUM MEN PO) Take by mouth.    . Probiotic Product (PROBIOTIC PO) Take by mouth.    . rosuvastatin (CRESTOR) 10 MG tablet TAKE 1 TABLET(10 MG) BY MOUTH DAILY 30 tablet 5  . sildenafil (VIAGRA) 25 MG tablet Take 2-4 tablets (50-100 mg total) by mouth daily as needed for erectile dysfunction. 10 tablet 2  . busPIRone (BUSPAR) 15 MG tablet Take 1/3 tablet p.o. twice daily for 1 week, then take 2/3 tablet p.o. twice daily for 1 week, then take 1 tablet p.o. twice daily (Patient not taking: Reported on 04/09/2019) 60 tablet 2  . fluticasone (FLONASE) 50 MCG/ACT nasal spray Place 2 sprays into both nostrils daily. 16 g 0  . lamoTRIgine (LAMICTAL) 200 MG tablet Take 1 tablet (200 mg total) by mouth daily. 90 tablet 1  . Vilazodone HCl (VIIBRYD) 20 MG TABS Take 1 tablet (20 mg total) by mouth  daily. 90 tablet 1   No facility-administered medications prior to visit.      Social History   Tobacco Use  . Smoking status: Former Smoker    Packs/day: 0.75    Years: 25.00    Pack years: 18.75    Types: Cigarettes    Quit date: 03/04/2016    Years since quitting: 3.1  . Smokeless tobacco: Never Used  Substance Use Topics  . 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 Drug use: No    Comment: in recovery   Social History   Substance and Sexual Activity  Sexual Activity Yes  . Partners: Male  . Birth control/protection: Condom   Objective:  Vitals:   04/09/19 1546  BP: 123/75  Pulse: 80  Temp: 98.4 F (36.9 C)  TempSrc: Oral  Weight: 138 lb (62.6 kg)  Body mass index is 20.98 kg/m.  Physical Exam  Constitutional: He is oriented to person, place, and time and well-developed, well-nourished, and in no distress.  Seated comfortably in the chair. Good spirits today.   HENT:  Mouth/Throat: Oropharynx is clear and moist. No oral lesions. Normal dentition. No dental caries.  Eyes: No scleral icterus.  Cardiovascular: Normal rate, regular rhythm and normal heart sounds.  Pulmonary/Chest: Effort normal and breath sounds normal.  Abdominal: Soft. He exhibits no distension. There is no abdominal tenderness.  Musculoskeletal: Normal range of motion.        General: No edema.  Lymphadenopathy:    He has no cervical adenopathy.  Neurological: He is alert and oriented to person, place, and time.  Skin: Skin is warm and dry. No rash noted.  Psychiatric: Affect normal.  Tearful at times during conversation     Lab Results Lab Results  Component Value Date   WBC 5.6 09/08/2018   HGB 15.0 09/08/2018   HCT 43.2 09/08/2018   MCV 91.9 09/08/2018   PLT 300 09/08/2018    Lab Results  Component Value Date   CREATININE 0.84 09/08/2018   BUN 14 09/08/2018   NA 136 09/08/2018   K 4.2 09/08/2018   CL 100 09/08/2018   CO2 27  09/08/2018    Lab Results  Component Value Date   ALT 45 09/08/2018   AST 26 09/08/2018   ALKPHOS 70 05/29/2016   BILITOT 0.4 09/08/2018    Lab Results  Component Value Date   CHOL 172 12/22/2018   HDL 35 (L) 12/22/2018   LDLCALC 121 (H) 12/22/2018   TRIG 72 12/22/2018   CHOLHDL 4.9 12/22/2018   HIV 1 RNA Quant (copies/mL)  Date Value  12/22/2018 56 (H)  09/08/2018 <20 DETECTED (A)  07/07/2018 53 (H)   CD4 T Cell Abs (/uL)  Date Value  12/22/2018 1,260  09/09/2018 1,470  09/08/2018    QUESTIONABLE RESULTS, RECOMMEND RECOLLECT TO VERIFY   Lab Results  Component Value Date   HAV NEG 02/24/2010   Lab Results  Component Value Date   HEPBSAG NEG 02/24/2010   HEPBSAB INDETER (A) 02/24/2010   Lab Results  Component Value Date   HCVAB NEGATIVE 09/14/2014   Lab Results  Component Value Date   CHLAMYDIAWP Negative 09/08/2018   N Negative 09/08/2018     Problem List Items Addressed This Visit      Unprioritized   Depression, major (HCC)    Continues on medical and therapeutic sessions with his mental health team.       Human immunodeficiency virus (HIV) disease (HCC)    Viral blip @ 52 copies last time but he has continual 100% adherence. We discussed other options for treatment and decided to continue on Triumeq for now. No DDI's detected with medication review today.  Screen STI as detailed below.  Seasonal flu shot in the fall.  He can return to clinic in 6 months for repeat. Can space out to Q1 year interval if his other medications are stable.       Hyperlipidemia    Improved. Continue to monitor.       Screening examination for venereal disease    Will screen in accordance with risk factors today with urine and oral gonorrhea/chlamydia swabs. Serum RPR.        Other Visit Diagnoses    Routine screening for STI (sexually transmitted infection)    -  Primary   Relevant Orders   Cytology (oral,  anal, urethral) ancillary only   Cytology (oral, anal,  urethral) ancillary only   RPR   High risk medication use       Relevant Orders   Lipid panel   Asymptomatic HIV infection ([place])       Relevant Orders   HIV-1 RNA quant-no reflex-bld   T-helper cell (CD4)- ([facility] clinic only)   CBC with Differential/Platelet   COMPLETE METABOLIC PANEL WITH GFR     Janene Madeira, MSN, NP-C [facility] for Infectious [place] Pager: [phone]  04/12/2019  3:56 PM

## 2019-04-12 ENCOUNTER — Encounter: Payer: Self-pay | Admitting: Infectious Diseases

## 2019-04-12 NOTE — Assessment & Plan Note (Signed)
Improved. Continue to monitor. 

## 2019-04-12 NOTE — Assessment & Plan Note (Signed)
Viral blip @ 52 copies last time but he has continual 100% adherence. We discussed other options for treatment and decided to continue on Triumeq for now. No DDI's detected with medication review today.  Screen STI as detailed below.  Seasonal flu shot in the fall.  He can return to clinic in 6 months for repeat. Can space out to Q1 year interval if his other medications are stable.

## 2019-04-12 NOTE — Assessment & Plan Note (Signed)
Will screen in accordance with risk factors today with urine and oral gonorrhea/chlamydia swabs. Serum RPR.

## 2019-04-12 NOTE — Assessment & Plan Note (Signed)
Continues on medical and therapeutic sessions with his mental health team.

## 2019-04-14 LAB — CYTOLOGY, (ORAL, ANAL, URETHRAL) ANCILLARY ONLY
Chlamydia: NEGATIVE
Chlamydia: NEGATIVE
Neisseria Gonorrhea: NEGATIVE
Neisseria Gonorrhea: NEGATIVE

## 2019-04-24 ENCOUNTER — Telehealth: Payer: Self-pay | Admitting: Internal Medicine

## 2019-04-24 NOTE — Telephone Encounter (Signed)
LMTCB to inform pt that visit with Dr. Debara Pickett on Monday, 7/13 with be regular office visit. Informed that he will need to wear a mask and we are not allowing visitors at this time unless necessary for him. Asked that he give [place] a call back to let us know if he will need assistance and if he is ok with regular office visit.

## 2019-04-27 ENCOUNTER — Encounter: Payer: Self-pay | Admitting: Internal Medicine

## 2019-04-27 ENCOUNTER — Ambulatory Visit: Payer: BC Managed Care – PPO | Admitting: Internal Medicine

## 2019-04-27 ENCOUNTER — Other Ambulatory Visit: Payer: Self-pay

## 2019-04-27 VITALS — BP 100/72 | HR 71 | Ht 68.0 in | Wt 142.0 lb

## 2019-04-27 DIAGNOSIS — E785 Hyperlipidemia, unspecified: Secondary | ICD-10-CM | POA: Diagnosis not present

## 2019-04-27 DIAGNOSIS — B20 Human immunodeficiency virus [HIV] disease: Secondary | ICD-10-CM | POA: Diagnosis not present

## 2019-04-27 NOTE — Patient Instructions (Signed)
Medication Instructions:  Your physician recommends that you continue on your current medications as directed. Please refer to the Current Medication list given to you today.  If you need a refill on your cardiac medications before your next appointment, please call your pharmacy.   Testing/Procedures: Dr. Rennis GoldenHilty has ordered a CT coronary calcium score. This test is done at [address]. This is $150 out of pocket.  Coronary CalciumScan A coronary calcium scan is an imaging test used to look for deposits of calcium and other fatty materials (plaques) in the inner lining of the blood vessels of the heart (coronary arteries). These deposits of calcium and plaques can partly clog and narrow the coronary arteries without producing any symptoms or warning signs. This puts a person at risk for a heart attack. This test can detect these deposits before symptoms develop. Tell a health care provider about:  Any allergies you have.  All medicines you are taking, including vitamins, herbs, eye drops, creams, and over-the-counter medicines.  Any problems you or family members have had with anesthetic medicines.  Any blood disorders you have.  Any surgeries you have had.  Any medical conditions you have.  Whether you are pregnant or may be pregnant. What are the risks? Generally, this is a safe procedure. However, problems may occur, including:  Harm to a pregnant woman and her unborn baby. This test involves the use of radiation. Radiation exposure can be dangerous to a pregnant woman and her unborn baby. If you are pregnant, you generally should not have this procedure done.  Slight increase in the risk of cancer. This is because of the radiation involved in the test. What happens before the procedure? No preparation is needed for this procedure. What happens during the procedure?  You will undress and remove any jewelry around your neck or chest.  You will put on a  hospital gown.  Sticky electrodes will be placed on your chest. The electrodes will be connected to an electrocardiogram (ECG) machine to record a tracing of the electrical activity of your heart.  A CT scanner will take pictures of your heart. During this time, you will be asked to lie still and hold your breath for 2-3 seconds while a picture of your heart is being taken. The procedure may vary among health care providers and hospitals. What happens after the procedure?  You can get dressed.  You can return to your normal activities.  It is up to you to get the results of your test. Ask your health care provider, or the department that is doing the test, when your results will be ready. Summary  A coronary calcium scan is an imaging test used to look for deposits of calcium and other fatty materials (plaques) in the inner lining of the blood vessels of the heart (coronary arteries).  Generally, this is a safe procedure. Tell your health care provider if you are pregnant or may be pregnant.  No preparation is needed for this procedure.  A CT scanner will take pictures of your heart.  You can return to your normal activities after the scan is done. This information is not intended to replace advice given to you by your health care provider. Make sure you discuss any questions you have with your health care provider. Document Released: 03/29/2008 Document Revised: 08/20/2016 Document Reviewed: 08/20/2016 Elsevier Interactive Patient Education  2017 [employer]    Follow-Up: Dr. Rennis GoldenHilty recommends that you schedule a follow up visit  with him the in the [place] as needed with Dr. Debara Pickett.  ** we will call with your test results and tailor your treatment and any additional follow up based on the findings

## 2019-04-29 ENCOUNTER — Encounter: Payer: Self-pay | Admitting: Internal Medicine

## 2019-04-29 ENCOUNTER — Other Ambulatory Visit: Payer: Self-pay

## 2019-04-29 DIAGNOSIS — F4310 Post-traumatic stress disorder, unspecified: Secondary | ICD-10-CM

## 2019-04-29 MED ORDER — BUSPIRONE HCL 30 MG PO TABS: ORAL_TABLET | ORAL | 1 refills | Status: DC

## 2019-04-29 NOTE — Progress Notes (Signed)
LIPID CLINIC CONSULT NOTE  Chief Complaint:  Manage dyslipidemia  Primary Care Physician: Seward Carol, MD  Primary Cardiologist:  No primary care provider on file.  HPI:  Frank Webster is a 43 y.o. male who is being seen today for the evaluation of dyslipidemia at the request of Turner, Eber Hong, MD.  This is a pleasant 43 year old male who was kindly referred to heart care for management of dyslipidemia.  He was previously seen by Dr. Radford Pax who recommended lipid clinic follow-up.  He has a past medical history significant for HIV disease which is been managed for more than 20 years and he has no detectable viral load.  He was recently on a combination therapy with Triumeq and Tivicay.  Lab work is demonstrated significant dyslipidemia.  About 7 months ago his total cholesterol was 268, HDL 35, triglycerides 143 and LDL cholesterol of 203.  This was concerning for familial dyslipidemia, however there is no significant family history of early onset heart disease or significant dyslipidemia.  Subsequently he had repeat labs about 4 months ago which showed a total cholesterol now is 172, HDL 35, triglycerides 72 and LDL of 121.  The only difference to explain the marked reduction in his lipid profile is that he came off of the [place], but still remains on Triumeq.  He is also on rosuvastatin 10 mg daily but has been on that for some time.  He has no known history of coronary disease and denies chest pain, shortness of breath or other associated symptoms.  PMHx:  Past Medical History:  Diagnosis Date  . Anxiety state 04/05/2016  . CANDIDIASIS, ORAL 10/11/2010   Qualifier: Diagnosis of  By: Magdalen Spatz NP, Romona Curls    . Decreased libido 04/05/2016  . Depression, major ([place]) 10/31/2010   Qualifier: Diagnosis of  By: Magdalen Spatz NP, Romona Curls    . Dermatophytosis of unspecified site 10/24/2010   Qualifier: Diagnosis of  By: Magdalen Spatz NP, Romona Curls    . Erectile dysfunction 09/26/2017  .  FOLLICULITIS 3/90/3009   Qualifier: Diagnosis of  By: Magdalen Spatz NP, Romona Curls    . Glossitis 10/03/2010   Qualifier: Diagnosis of  By: Magdalen Spatz NP, Romona Curls    . Hemorrhoid 09/29/2012  . HIV infection ([place])   . Inflamed seborrheic keratosis 10/11/2010   Qualifier: Diagnosis of  By: Magdalen Spatz NP, Romona Curls    . Left shoulder pain 03/17/2015  . Nonallopathic lesion of cervical region 04/14/2015  . Nonallopathic lesion of lumbosacral region 04/14/2015  . Nonallopathic lesion of thoracic region 04/14/2015  . Other specified disorder of rectum and anus 10/03/2010   Qualifier: Diagnosis of  By: Magdalen Spatz NP, Romona Curls    . OTITIS EXTERNA 06/14/2010   Qualifier: Diagnosis of  By: Tomma Lightning MD, Claiborne Billings    . Pruritus ani 10/24/2010   Qualifier: Diagnosis of  By: Magdalen Spatz NP, Romona Curls    . Racing heart beat 04/21/2015  . Scapular dyskinesis 03/24/2015  . Seasonal allergies 01/27/2014  . Sprain of acromioclavicular joint 03/17/2015  . STD exposure 09/26/2017  . Substance abuse ([place]) 01/27/2014  . Substance abuse in remission ([place])   . Testicular/scrotal pain 04/21/2015  . Tobacco abuse 03/22/2015  . URI 10/31/2010   Qualifier: Diagnosis of  By: Magdalen Spatz NP, Romona Curls    . VINCENTS ANGINA 10/03/2010   Qualifier: Diagnosis of  By: Magdalen Spatz NP, Romona Curls      Past Surgical History:  Procedure Laterality Date  . TESTICLE TORSION REDUCTION      FAMHx:  Family History  Problem  Relation Age of Onset  . Cancer Maternal Grandmother   . Tremor Father   . Eating disorder Sister   . Depression Sister   . Tremor Paternal Uncle   . Tremor Paternal Grandfather   . Eating disorder Sister   . Eating disorder Sister   . Arthritis Neg Hx   . Lupus Neg Hx   . Rosacea Neg Hx     SOCHx:   reports that he quit smoking about 3 years ago. His smoking use included cigarettes. He has a 18.75 pack-year smoking history. He has never used smokeless tobacco. He reports that he does not drink alcohol or use drugs.   ALLERGIES:  No Known Allergies  ROS: Pertinent items noted in HPI and remainder of comprehensive ROS otherwise negative.  HOME MEDS: Current Outpatient Medications on File Prior to Visit  Medication Sig Dispense Refill  . abacavir-dolutegravir-lamiVUDine (TRIUMEQ) 600-50-300 MG tablet Take 1 tablet by mouth every morning. 30 tablet 2  . busPIRone (BUSPAR) 30 MG tablet Take 2/3 tablet BID x 1 week, then increase 1 tab po BID 60 tablet 1  . cetirizine (ZYRTEC) 10 MG tablet Take 10 mg by mouth daily.    . clonazePAM (KLONOPIN) 0.5 MG tablet Take 0.5 mg by mouth daily.    . fluticasone (FLONASE) 50 MCG/ACT nasal spray Place 2 sprays into both nostrils daily. 16 g 0  . ibuprofen (ADVIL,MOTRIN) 600 MG tablet Take 1 tablet (600 mg total) by mouth every 6 (six) hours as needed. 30 tablet 0  . lamoTRIgine (LAMICTAL) 200 MG tablet Take 1 tablet (200 mg total) by mouth daily. 90 tablet 1  . LYSINE PO Take by mouth.    . Multiple Vitamins-Minerals (CENTRUM MEN PO) Take by mouth.    . Probiotic Product (PROBIOTIC PO) Take by mouth.    . rosuvastatin (CRESTOR) 10 MG tablet TAKE 1 TABLET(10 MG) BY MOUTH DAILY 30 tablet 5  . sildenafil (VIAGRA) 25 MG tablet Take 2-4 tablets (50-100 mg total) by mouth daily as needed for erectile dysfunction. 10 tablet 2  . Vilazodone HCl (VIIBRYD) 20 MG TABS Take 1 tablet (20 mg total) by mouth daily. 90 tablet 1   No current facility-administered medications on file prior to visit.     LABS/IMAGING: No results found for this or any previous visit (from the past 48 hour(s)). No results found.  LIPID PANEL:    Component Value Date/Time   CHOL 172 12/22/2018 0905   TRIG 72 12/22/2018 0905   HDL 35 (L) 12/22/2018 0905   CHOLHDL 4.9 12/22/2018 0905   VLDL 20 05/29/2016 1441   LDLCALC 121 (H) 12/22/2018 0905    WEIGHTS: Wt Readings from Last 3 Encounters:  04/27/19 142 lb (64.4 kg)  04/09/19 138 lb (62.6 kg)  12/04/18 153 lb 9.6 oz (69.7 kg)    VITALS:  BP 100/72   Pulse 71   Ht 5\' 8"  (1.727 m)   Wt 142 lb (64.4 kg)   SpO2 96%   BMI 21.59 kg/m   EXAM: General appearance: alert and no distress Neck: no JVD, supple, symmetrical, trachea midline and thyroid not enlarged, symmetric, no tenderness/mass/nodules Lungs: clear to auscultation bilaterally Heart: regular rate and rhythm Abdomen: soft, non-tender; bowel sounds normal; no masses,  no organomegaly Extremities: extremities normal, atraumatic, no cyanosis or edema Pulses: 2+ and symmetric Skin: Skin color, texture, turgor normal. No rashes or lesions Neurologic: Grossly normal Psych: Pleasant  EKG: Deferred  ASSESSMENT: 1. Mixed dyslipidemia 2. HIV disease with  triple therapy and no detectable viral load  PLAN: 1.   Mr. Cooper Renderremols has dyslipidemia which is markedly improved after recent cessation of Tivicay.  Almost all of the HIV medications are associated with dyslipidemia and increases in LDL, triglycerides and total cholesterol.  I suspect this is a big factor in why his LDL was significantly elevated.  There is no evidence suggest early family heart disease.  That being said, I think would be helpful for us to pursue coronary calcium scoring.  If this is 0 or low risk, then I would not recommend more aggressive therapy.  He does have excellent control with his current triple therapy and has had long-term control over his HIV disease.  While there are some subtle changes that could be made to his medications that might improve his lipid profile, I think it is likely more important for him to have good control over his HIV disease.  I would likely recommend continuing his rosuvastatin and might consider a small increase if there is some evidence of early coronary disease to target a lower LDL.  Thanks again for the kind referral.  Chrystie NoseKenneth C. Damaso Laday, MD, [facility] Medical CenterFACC, FACP  [place]  [facility]  Medical Director of the [facility] Lipid Disorders &  Cardiovascular Risk Reduction  Clinic Diplomate of the American Board of Clinical Lipidology Attending Cardiologist  Direct Dial: [phone]  Fax: [phone]  Website:  [email or web address]  Judas Mohammad C Dierks Wach 04/29/2019, 10:55 AM

## 2019-05-20 ENCOUNTER — Other Ambulatory Visit: Payer: Self-pay

## 2019-05-20 ENCOUNTER — Inpatient Hospital Stay: Admission: RE | Admit: 2019-05-20 | Payer: BC Managed Care – PPO | Source: Ambulatory Visit

## 2019-05-20 DIAGNOSIS — Z20822 Contact with and (suspected) exposure to covid-19: Secondary | ICD-10-CM

## 2019-05-21 ENCOUNTER — Other Ambulatory Visit: Payer: Self-pay | Admitting: Infectious Diseases

## 2019-05-21 DIAGNOSIS — B20 Human immunodeficiency virus [HIV] disease: Secondary | ICD-10-CM

## 2019-05-21 LAB — NOVEL CORONAVIRUS, NAA: SARS-CoV-2, NAA: NOT DETECTED

## 2019-05-22 ENCOUNTER — Other Ambulatory Visit: Payer: Self-pay | Admitting: Infectious Diseases

## 2019-06-08 ENCOUNTER — Inpatient Hospital Stay: Admission: RE | Admit: 2019-06-08 | Payer: BC Managed Care – PPO | Source: Ambulatory Visit

## 2019-06-16 ENCOUNTER — Other Ambulatory Visit: Payer: Self-pay | Admitting: Psychiatry

## 2019-06-16 DIAGNOSIS — F4310 Post-traumatic stress disorder, unspecified: Secondary | ICD-10-CM

## 2019-06-16 DIAGNOSIS — F39 Unspecified mood [affective] disorder: Secondary | ICD-10-CM

## 2019-06-25 ENCOUNTER — Other Ambulatory Visit: Payer: Self-pay

## 2019-06-25 ENCOUNTER — Encounter (HOSPITAL_COMMUNITY): Payer: Self-pay | Admitting: Emergency Medicine

## 2019-06-25 ENCOUNTER — Emergency Department (HOSPITAL_COMMUNITY): Payer: BC Managed Care – PPO

## 2019-06-25 ENCOUNTER — Emergency Department (HOSPITAL_COMMUNITY)
Admission: EM | Admit: 2019-06-25 | Discharge: 2019-06-25 | Disposition: A | Discharge status: Home / Self Care | Payer: BC Managed Care – PPO | Attending: Emergency Medicine | Admitting: Emergency Medicine

## 2019-06-25 DIAGNOSIS — N23 Unspecified renal colic: Secondary | ICD-10-CM | POA: Insufficient documentation

## 2019-06-25 DIAGNOSIS — N50812 Left testicular pain: Secondary | ICD-10-CM | POA: Diagnosis present

## 2019-06-25 DIAGNOSIS — Z87891 Personal history of nicotine dependence: Secondary | ICD-10-CM | POA: Diagnosis not present

## 2019-06-25 DIAGNOSIS — Z21 Asymptomatic human immunodeficiency virus [HIV] infection status: Secondary | ICD-10-CM | POA: Diagnosis not present

## 2019-06-25 DIAGNOSIS — K769 Liver disease, unspecified: Secondary | ICD-10-CM | POA: Diagnosis not present

## 2019-06-25 DIAGNOSIS — Z79899 Other long term (current) drug therapy: Secondary | ICD-10-CM | POA: Diagnosis not present

## 2019-06-25 LAB — URINALYSIS, ROUTINE W REFLEX MICROSCOPIC
Bilirubin Urine: NEGATIVE
Glucose, UA: NEGATIVE mg/dL
Ketones, ur: NEGATIVE mg/dL
Nitrite: NEGATIVE
Protein, ur: NEGATIVE mg/dL
RBC / HPF: >50 RBC/hpf — ABNORMAL HIGH (ref 0–5)
Specific Gravity, Urine: 1.017 (ref 1.005–1.030)
pH: 6 (ref 5.0–8.0)

## 2019-06-25 LAB — BASIC METABOLIC PANEL
Anion gap: 15 (ref 5–15)
BUN: 14 mg/dL (ref 6–20)
CO2: 22 mmol/L (ref 22–32)
Calcium: 9.6 mg/dL (ref 8.9–10.3)
Chloride: 98 mmol/L (ref 98–111)
Creatinine, Ser: 1 mg/dL (ref 0.61–1.24)
GFR calc Af Amer: >60 mL/min (ref >60)
GFR calc non Af Amer: >60 mL/min (ref >60)
Glucose, Bld: 117 mg/dL — ABNORMAL HIGH (ref 70–99)
Potassium: 4.2 mmol/L (ref 3.5–5.1)
Sodium: 135 mmol/L (ref 135–145)

## 2019-06-25 LAB — CBC
HCT: 43.3 % (ref 39.0–52.0)
Hemoglobin: 15 g/dL (ref 13.0–17.0)
MCH: 32.1 pg (ref 26.0–34.0)
MCHC: 34.6 g/dL (ref 30.0–36.0)
MCV: 92.5 fL (ref 80.0–100.0)
Platelets: 339 10*3/uL (ref 150–400)
RBC: 4.68 MIL/uL (ref 4.22–5.81)
RDW: 12.5 % (ref 11.5–15.5)
WBC: 8.9 10*3/uL (ref 4.0–10.5)
nRBC: 0 % (ref 0.0–0.2)

## 2019-06-25 MED ORDER — HYDROMORPHONE HCL 1 MG/ML IJ SOLN
1.0000 mg | Freq: Once | INTRAMUSCULAR | Status: AC
  Administered 2019-06-25: 1 mg via INTRAVENOUS
  Filled 2019-06-25: qty 1

## 2019-06-25 MED ORDER — TAMSULOSIN HCL 0.4 MG PO CAPS: 0.4000 mg | ORAL_CAPSULE | Freq: Every day | ORAL | 0 refills | Status: DC

## 2019-06-25 MED ORDER — HYDROCODONE-ACETAMINOPHEN 5-325 MG PO TABS: 1.0000 | ORAL_TABLET | Freq: Four times a day (QID) | ORAL | 0 refills | Status: DC | PRN

## 2019-06-25 MED ORDER — HYDROMORPHONE HCL 1 MG/ML IJ SOLN
1.0000 mg | INTRAMUSCULAR | Status: DC | PRN
  Administered 2019-06-25: 1 mg via INTRAVENOUS
  Filled 2019-06-25: qty 1

## 2019-06-25 MED ORDER — HYDROMORPHONE HCL 1 MG/ML IJ SOLN
2.0000 mg | Freq: Once | INTRAMUSCULAR | Status: AC
  Administered 2019-06-25: 2 mg via INTRAVENOUS
  Filled 2019-06-25: qty 2

## 2019-06-25 MED ORDER — SODIUM CHLORIDE 0.9 % IV BOLUS
1000.0000 mL | Freq: Once | INTRAVENOUS | Status: AC
  Administered 2019-06-25: 1000 mL via INTRAVENOUS

## 2019-06-25 MED ORDER — HYDROMORPHONE HCL 1 MG/ML IJ SOLN: 1.0000 mg | Freq: Once | INTRAMUSCULAR | Status: DC

## 2019-06-25 MED ORDER — IBUPROFEN 600 MG PO TABS: 600.0000 mg | ORAL_TABLET | Freq: Four times a day (QID) | ORAL | 0 refills | Status: DC | PRN

## 2019-06-25 MED ORDER — SODIUM CHLORIDE 0.9 % IV SOLN
1.0000 g | Freq: Once | INTRAVENOUS | Status: AC
  Administered 2019-06-25: 1 g via INTRAVENOUS
  Filled 2019-06-25: qty 10

## 2019-06-25 MED ORDER — CEPHALEXIN 500 MG PO CAPS: 500.0000 mg | ORAL_CAPSULE | Freq: Four times a day (QID) | ORAL | 0 refills | Status: DC

## 2019-06-25 NOTE — ED Notes (Signed)
Patient is aware he needs a urine spec. Unable to go at present.

## 2019-06-25 NOTE — ED Triage Notes (Signed)
Patient c/o severe left testicle pain radiating up into his lower abdomen and left flank pain onset of today. Patient tearful in triage. Reports history of torsion.

## 2019-06-25 NOTE — ED Notes (Signed)
States he was sitting on the commode and he got a cramp in his left testicle and now the pain is radiating to the left flank. States he had surgery on the testicle .

## 2019-06-25 NOTE — ED Notes (Signed)
Patient returned from u/s c/o severe back pain

## 2019-06-25 NOTE — Discharge Instructions (Addendum)
You have a kidney stone and may have a urine infection   Take motrin for pain   Take vicodin for severe pain   Take flomax to help you pass the stone   Take keflex for UTI   See your doctor and urologist   You have a small liver lesion that is incidental and that can be followed up with your doctor  Return to ER if you have severe abdominal pain, vomiting, fever.

## 2019-06-25 NOTE — ED Notes (Signed)
Patient  Transported to U/S

## 2019-06-25 NOTE — ED Notes (Signed)
CCM at bedside. Orders received.

## 2019-06-25 NOTE — ED Provider Notes (Signed)
Frank Webster Provider Note   CSN: [id number] Arrival date & time: 06/25/19  1206     History   Chief Complaint Chief Complaint  Patient presents with  . Testicle Pain    HPI [name] is a 43 y.o. male.     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  He reports he never had problems with that.  He reports that several days ago he started getting an occasional sharp pain in the left testicle.  It resolved pretty quickly.  Today however about 30 minutes prior to arrival he got a severe and intense pain that is radiating from the flank area down into his groin and the testicle.  He reports it is intensely painful.  He denies he has had any pain burning urgency with urination.  He is not seeing blood in the urine.  He has not had nausea vomiting or diarrhea.  He has had no respiratory illnesses.  No personal history of kidney stones. Past Medical History:  Diagnosis Date  . Anxiety state 04/05/2016  . CANDIDIASIS, ORAL 10/11/2010   Qualifier: Diagnosis of  By: Magdalen Spatz NP, Romona Curls    . Decreased libido 04/05/2016  . Depression, major ([place]) 10/31/2010   Qualifier: Diagnosis of  By: Magdalen Spatz NP, Romona Curls    . Dermatophytosis of unspecified site 10/24/2010   Qualifier: Diagnosis of  By: Magdalen Spatz NP, Romona Curls    . Erectile dysfunction 09/26/2017  . FOLLICULITIS 10/16/5850   Qualifier: Diagnosis of  By: Magdalen Spatz NP, Romona Curls    . Glossitis 10/03/2010   Qualifier: Diagnosis of  By: Magdalen Spatz NP, Romona Curls    . Hemorrhoid 09/29/2012  . HIV infection ([place])   . Inflamed seborrheic keratosis 10/11/2010   Qualifier: Diagnosis of  By: Magdalen Spatz NP, Romona Curls    . Left shoulder pain 03/17/2015  . Nonallopathic lesion of cervical region 04/14/2015  . Nonallopathic lesion of lumbosacral region 04/14/2015  . Nonallopathic lesion of thoracic region 04/14/2015  . Other specified disorder of  rectum and anus 10/03/2010   Qualifier: Diagnosis of  By: Magdalen Spatz NP, Romona Curls    . OTITIS EXTERNA 06/14/2010   Qualifier: Diagnosis of  By: Tomma Lightning MD, Claiborne Billings    . Pruritus ani 10/24/2010   Qualifier: Diagnosis of  By: Magdalen Spatz NP, Romona Curls    . Racing heart beat 04/21/2015  . Scapular dyskinesis 03/24/2015  . Seasonal allergies 01/27/2014  . Sprain of acromioclavicular joint 03/17/2015  . STD exposure 09/26/2017  . Substance abuse ([place]) 01/27/2014  . Substance abuse in remission ([place])   . Testicular/scrotal pain 04/21/2015  . Tobacco abuse 03/22/2015  . URI 10/31/2010   Qualifier: Diagnosis of  By: Magdalen Spatz NP, Romona Curls    . VINCENTS ANGINA 10/03/2010   Qualifier: Diagnosis of  By: Magdalen Spatz NP, Romona Curls      Patient Active Problem List   Diagnosis Date Noted  . Anxiety state 04/05/2016  . Decreased libido 04/05/2016  . Hyperlipidemia 04/21/2015  . Nonallopathic lesion of thoracic region 04/14/2015  . Scapular dyskinesis 03/24/2015  . Health care maintenance 02/15/2015  . Screening examination for venereal disease 09/30/2014  . Seasonal allergies 01/27/2014  . Hemorrhoid 09/29/2012  . Depression, major ([place]) 10/31/2010  . Human immunodeficiency virus (HIV) disease ([place]) 02/23/2010    Past Surgical History:  Procedure Laterality Date  . TESTICLE TORSION REDUCTION          Home Medications    Prior to Admission  medications   Medication Sig Start Date End Date Taking? Authorizing Provider  busPIRone (BUSPAR) 30 MG tablet Take 2/3 tablet BID x 1 week, then increase 1 tab po BID 04/29/19   Corie Chiquitoarter, Jessica, PMHNP  cetirizine (ZYRTEC) 10 MG tablet Take 10 mg by mouth daily.    [provider]  clonazePAM (KLONOPIN) 0.5 MG tablet Take 0.5 mg by mouth daily.    [provider]  fluticasone (FLONASE) 50 MCG/ACT nasal spray Place 2 sprays into both nostrils daily. 01/31/18   Domenick GongMortenson, Ashley, MD  ibuprofen (ADVIL,MOTRIN) 600 MG tablet Take 1 tablet (600 mg total)  by mouth every 6 (six) hours as needed. 01/31/18   Domenick GongMortenson, Ashley, MD  lamoTRIgine (LAMICTAL) 200 MG tablet Take 1 tablet (200 mg total) by mouth daily. 11/27/18 04/27/19  Corie Chiquitoarter, Jessica, PMHNP  LYSINE PO Take by mouth.    [provider]  Multiple Vitamins-Minerals (CENTRUM MEN PO) Take by mouth.    [provider]  Probiotic Product (PROBIOTIC PO) Take by mouth.    [provider]  rosuvastatin (CRESTOR) 10 MG tablet TAKE 1 TABLET(10 MG) BY MOUTH DAILY 05/22/19   Blanchard Kelchixon, Stephanie N, NP  sildenafil (VIAGRA) 25 MG tablet Take 2-4 tablets (50-100 mg total) by mouth daily as needed for erectile dysfunction. 09/24/18   Dixon, Gomez CleverlyStephanie N, NP  TRIUMEQ 600-50-300 MG tablet TAKE 1 TABLET BY MOUTH ONCE DAILY IN THE MORNING WITH OR WITHOUT FOOD.STORE IN ORIGINAL CONTAINER AT ROOM TEMPERATURE. 05/21/19   Blanchard Kelchixon, Stephanie N, NP  VIIBRYD 20 MG TABS TAKE 1 TABLET(20 MG) BY MOUTH DAILY 06/16/19   Corie Chiquitoarter, Jessica, PMHNP    Family History Family History  Problem Relation Age of Onset  . Cancer Maternal Grandmother   . Tremor Father   . Eating disorder Sister   . Depression Sister   . Tremor Paternal Uncle   . Tremor Paternal Grandfather   . Eating disorder Sister   . Eating disorder Sister   . Arthritis Neg Hx   . Lupus Neg Hx   . Rosacea Neg Hx     Social History Social History   Tobacco Use  . Smoking status: Former Smoker    Packs/day: 0.75    Years: 25.00    Pack years: 18.75    Types: Cigarettes    Quit date: 03/04/2016    Years since quitting: 3.3  . Smokeless tobacco: Never Used  Substance Use Topics  . 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 Drug use: No    Comment: in recovery     Allergies   Patient has no known allergies.   Review of Systems Review of Systems 10 Systems reviewed and are negative for acute change except as noted in the HPI.   Physical Exam Updated Vital Signs BP 136/75   Pulse 90    Temp 98.6 F (37 C)   Resp (!) 22   SpO2 100%   Physical Exam Constitutional:      Comments: Patient is alert and nontoxic.  He does appear to be in severe pain.  HENT:     Head: Normocephalic and atraumatic.  Eyes:     Extraocular Movements: Extraocular movements intact.  Cardiovascular:     Rate and Rhythm: Normal rate and regular rhythm.  Pulmonary:     Effort: Pulmonary effort is normal.     Breath sounds: Normal breath sounds.  Abdominal:     Palpations: Abdomen is  soft.     Tenderness: There is abdominal tenderness.     Comments: Mild tenderness left lower quadrant.  No guarding.  No mass or fullness in the inguinal canal on the left.  Genitourinary:    Comments: Penis normal.  Scrotum is not enlarged or swollen.  Left testicle is tender but contours are smooth and does not seem elevated or rotated. Musculoskeletal: Normal range of motion.        General: No swelling or tenderness.     Right lower leg: No edema.     Left lower leg: No edema.  Skin:    General: Skin is warm and dry.  Neurological:     General: No focal deficit present.     Mental Status: He is oriented to person, place, and time.     Coordination: Coordination normal.      ED Treatments / Results  Labs (all labs ordered are listed, but only abnormal results are displayed) Labs Reviewed - No data to display  EKG None  Radiology No results found.  Procedures Procedures (including critical care time)  Medications Ordered in ED Medications - No data to display   Initial Impression / Assessment and Plan / ED Course  I have reviewed the triage vital signs and the nursing notes.  Pertinent labs & imaging results that were available during my care of the patient were reviewed by me and considered in my medical decision making (see chart for details).       Scrotal ultrasound is negative.  Patient does not have scrotal swelling or edema.  Patient has experienced severe flank pain as well.   At this time CT stone study is pending.  Dr. Silverio Lay to review the CT results and make final disposition.  Final Clinical Impressions(s) / ED Diagnoses   Final diagnoses:  None    ED Discharge Orders    None       Arby Barrette, MD 06/25/19 1620

## 2019-06-25 NOTE — ED Provider Notes (Signed)
  Physical Exam  BP 106/82   Pulse 75   Temp 98.6 F (37 C)   Resp 18   SpO2 97%   Physical Exam  ED Course/Procedures     Procedures  MDM  Patient care assumed at 4 pm. Patient here with left testicular pain.  Patient had previous history of torsion by ultrasound showed no torsion.  Still had pain so CT renal stone was ordered and was pending.  7:10 PM CT showed 5 mm L obstructive calculus. Incidental liver masses that can be followed up. UA ? UTI. Pain controlled in the ED. Will dc home with pain meds, motrin, flomax, urology follow up.       Drenda Freeze, MD 06/25/19 [phone]

## 2019-06-25 NOTE — ED Notes (Signed)
Patient's sats dropped several times into the low 80's post medication,  Patient told to take a deep breath and o2 at 2 liters applied.

## 2019-06-27 LAB — URINE CULTURE

## 2019-06-29 ENCOUNTER — Other Ambulatory Visit: Payer: Self-pay | Admitting: Psychiatry

## 2019-06-29 DIAGNOSIS — F4310 Post-traumatic stress disorder, unspecified: Secondary | ICD-10-CM

## 2019-07-02 ENCOUNTER — Other Ambulatory Visit: Payer: Self-pay

## 2019-07-02 ENCOUNTER — Encounter (HOSPITAL_COMMUNITY): Payer: Self-pay | Admitting: Emergency Medicine

## 2019-07-02 ENCOUNTER — Emergency Department (HOSPITAL_COMMUNITY)
Admission: EM | Admit: 2019-07-02 | Discharge: 2019-07-02 | Disposition: A | Discharge status: Home / Self Care | Payer: BC Managed Care – PPO | Attending: Emergency Medicine | Admitting: Emergency Medicine

## 2019-07-02 ENCOUNTER — Emergency Department (HOSPITAL_COMMUNITY): Payer: BC Managed Care – PPO

## 2019-07-02 DIAGNOSIS — B20 Human immunodeficiency virus [HIV] disease: Secondary | ICD-10-CM | POA: Insufficient documentation

## 2019-07-02 DIAGNOSIS — R1032 Left lower quadrant pain: Secondary | ICD-10-CM | POA: Insufficient documentation

## 2019-07-02 DIAGNOSIS — Z20828 Contact with and (suspected) exposure to other viral communicable diseases: Secondary | ICD-10-CM | POA: Insufficient documentation

## 2019-07-02 DIAGNOSIS — Z87442 Personal history of urinary calculi: Secondary | ICD-10-CM | POA: Diagnosis not present

## 2019-07-02 DIAGNOSIS — R109 Unspecified abdominal pain: Secondary | ICD-10-CM

## 2019-07-02 DIAGNOSIS — Z79899 Other long term (current) drug therapy: Secondary | ICD-10-CM | POA: Diagnosis not present

## 2019-07-02 DIAGNOSIS — Z87891 Personal history of nicotine dependence: Secondary | ICD-10-CM | POA: Diagnosis not present

## 2019-07-02 DIAGNOSIS — R112 Nausea with vomiting, unspecified: Secondary | ICD-10-CM | POA: Insufficient documentation

## 2019-07-02 LAB — CBC
HCT: 41.3 % (ref 39.0–52.0)
Hemoglobin: 14.1 g/dL (ref 13.0–17.0)
MCH: 31.8 pg (ref 26.0–34.0)
MCHC: 34.1 g/dL (ref 30.0–36.0)
MCV: 93.2 fL (ref 80.0–100.0)
Platelets: 278 10*3/uL (ref 150–400)
RBC: 4.43 MIL/uL (ref 4.22–5.81)
RDW: 12.3 % (ref 11.5–15.5)
WBC: 9.9 10*3/uL (ref 4.0–10.5)
nRBC: 0 % (ref 0.0–0.2)

## 2019-07-02 LAB — URINALYSIS, ROUTINE W REFLEX MICROSCOPIC
Bilirubin Urine: NEGATIVE
Glucose, UA: NEGATIVE mg/dL
Ketones, ur: NEGATIVE mg/dL
Leukocytes,Ua: NEGATIVE
Nitrite: NEGATIVE
Protein, ur: NEGATIVE mg/dL
Specific Gravity, Urine: 1.014 (ref 1.005–1.030)
pH: 6 (ref 5.0–8.0)

## 2019-07-02 LAB — SARS CORONAVIRUS 2 (TAT 6-24 HRS): SARS Coronavirus 2: NEGATIVE

## 2019-07-02 LAB — BASIC METABOLIC PANEL
Anion gap: 6 (ref 5–15)
BUN: 20 mg/dL (ref 6–20)
CO2: 31 mmol/L (ref 22–32)
Calcium: 9.6 mg/dL (ref 8.9–10.3)
Chloride: 98 mmol/L (ref 98–111)
Creatinine, Ser: 1.29 mg/dL — ABNORMAL HIGH (ref 0.61–1.24)
GFR calc Af Amer: >60 mL/min (ref >60)
GFR calc non Af Amer: >60 mL/min (ref >60)
Glucose, Bld: 110 mg/dL — ABNORMAL HIGH (ref 70–99)
Potassium: 3.9 mmol/L (ref 3.5–5.1)
Sodium: 135 mmol/L (ref 135–145)

## 2019-07-02 MED ORDER — SODIUM CHLORIDE 0.9 % IV BOLUS
500.0000 mL | Freq: Once | INTRAVENOUS | Status: AC
  Administered 2019-07-02: 15:00:00 500 mL via INTRAVENOUS

## 2019-07-02 MED ORDER — HYDROMORPHONE HCL 1 MG/ML IJ SOLN
1.0000 mg | Freq: Once | INTRAMUSCULAR | Status: AC
  Administered 2019-07-02: 1 mg via INTRAVENOUS
  Filled 2019-07-02: qty 1

## 2019-07-02 MED ORDER — ONDANSETRON HCL 4 MG/2ML IJ SOLN
4.0000 mg | Freq: Once | INTRAMUSCULAR | Status: AC
  Administered 2019-07-02: 14:00:00 4 mg via INTRAVENOUS
  Filled 2019-07-02: qty 2

## 2019-07-02 MED ORDER — IBUPROFEN 600 MG PO TABS: 600.0000 mg | ORAL_TABLET | Freq: Four times a day (QID) | ORAL | 0 refills | Status: AC | PRN

## 2019-07-02 MED ORDER — HYDROMORPHONE HCL 1 MG/ML IJ SOLN
1.0000 mg | Freq: Once | INTRAMUSCULAR | Status: AC
  Administered 2019-07-02: 14:00:00 1 mg via INTRAVENOUS
  Filled 2019-07-02: qty 1

## 2019-07-02 NOTE — ED Provider Notes (Signed)
[facility] Provider Note   CSN: [id number] Arrival date & time: 07/02/19  1232     History   Chief Complaint Chief Complaint  Patient presents with  . Flank Pain    HPI Frank Webster is a 43 y.o. male.     The history is provided by the patient and medical records. No language interpreter was used.  Flank Pain     43 year old male with history of HIV, anxiety, depression, recently diagnosed with kidney stones a week ago presenting complaining of left flank pain.  Patient states he was seen in the hospital a week ago for left flank pain radiates to his testicle.  CT scan demonstrated a 5 mm obstructive calculus within the distal left ureter near the UVJ with moderate hydronephrosis and hydroureter.  Patient discharged home with pain medication.  Patient states that night the pain went away and he has been symptom-free up until today when he developed acute onset of pain to his left flank which initially started in his left testicle and radiated up.  Pain is 10 out of 10, he felt nauseous, vomited, and states pain is persistent but he seems to make it better or worse.  Pain feels similar to the recent kidney stone.  He is unsure if he has passed the previous stone.  He tries taking his medication but unable to keep it down.  He denies any dysuria or hematuria.  Past Medical History:  Diagnosis Date  . Anxiety state 04/05/2016  . CANDIDIASIS, ORAL 10/11/2010   Qualifier: Diagnosis of  By: Sundra AlandFarrington NP, Malvin JohnsBradford    . Decreased libido 04/05/2016  . Depression, major (HCC) 10/31/2010   Qualifier: Diagnosis of  By: Sundra AlandFarrington NP, Malvin JohnsBradford    . Dermatophytosis of unspecified site 10/24/2010   Qualifier: Diagnosis of  By: Sundra AlandFarrington NP, Malvin JohnsBradford    . Erectile dysfunction 09/26/2017  . FOLLICULITIS 10/24/2010   Qualifier: Diagnosis of  By: Sundra AlandFarrington NP, Malvin JohnsBradford    . Glossitis 10/03/2010   Qualifier: Diagnosis of  By: Sundra AlandFarrington NP, Malvin JohnsBradford     . Hemorrhoid 09/29/2012  . HIV infection (HCC)   . Inflamed seborrheic keratosis 10/11/2010   Qualifier: Diagnosis of  By: Sundra AlandFarrington NP, Malvin JohnsBradford    . Left shoulder pain 03/17/2015  . Nonallopathic lesion of cervical region 04/14/2015  . Nonallopathic lesion of lumbosacral region 04/14/2015  . Nonallopathic lesion of thoracic region 04/14/2015  . Other specified disorder of rectum and anus 10/03/2010   Qualifier: Diagnosis of  By: Sundra AlandFarrington NP, Malvin JohnsBradford    . OTITIS EXTERNA 06/14/2010   Qualifier: Diagnosis of  By: Philipp DeputyVollmer MD, Tresa EndoKelly    . Pruritus ani 10/24/2010   Qualifier: Diagnosis of  By: Sundra AlandFarrington NP, Malvin JohnsBradford    . Racing heart beat 04/21/2015  . Scapular dyskinesis 03/24/2015  . Seasonal allergies 01/27/2014  . Sprain of acromioclavicular joint 03/17/2015  . STD exposure 09/26/2017  . Substance abuse (HCC) 01/27/2014  . Substance abuse in remission (HCC)   . Testicular/scrotal pain 04/21/2015  . Tobacco abuse 03/22/2015  . URI 10/31/2010   Qualifier: Diagnosis of  By: Sundra AlandFarrington NP, Malvin JohnsBradford    . VINCENTS ANGINA 10/03/2010   Qualifier: Diagnosis of  By: Sundra AlandFarrington NP, Malvin JohnsBradford      Patient Active Problem List   Diagnosis Date Noted  . Anxiety state 04/05/2016  . Decreased libido 04/05/2016  . Hyperlipidemia 04/21/2015  . Nonallopathic lesion of thoracic region 04/14/2015  . Scapular dyskinesis 03/24/2015  . Health care maintenance  02/15/2015  . Screening examination for venereal disease 09/30/2014  . Seasonal allergies 01/27/2014  . Hemorrhoid 09/29/2012  . Depression, major ([place]) 10/31/2010  . Human immunodeficiency virus (HIV) disease ([place]) 02/23/2010    Past Surgical History:  Procedure Laterality Date  . TESTICLE TORSION REDUCTION          Home Medications    Prior to Admission medications   Medication Sig Start Date End Date Taking? Authorizing Provider  busPIRone (BUSPAR) 30 MG tablet Take 1 tablet (30 mg total) by mouth 2 (two) times daily. 06/29/19   Thayer Headings, PMHNP  cephALEXin (KEFLEX) 500 MG capsule Take 1 capsule (500 mg total) by mouth 4 (four) times daily. 06/25/19   Drenda Freeze, MD  cetirizine (ZYRTEC) 10 MG tablet Take 10 mg by mouth daily.    [provider]  clonazePAM (KLONOPIN) 0.5 MG tablet Take 0.25-0.5 mg by mouth at bedtime.     [provider]  fluticasone (FLONASE) 50 MCG/ACT nasal spray Place 2 sprays into both nostrils daily. Patient taking differently: Place 2 sprays into both nostrils as needed for allergies.  01/31/18   Melynda Ripple, MD  HYDROcodone-acetaminophen (NORCO) 5-325 MG tablet Take 1 tablet by mouth every 6 (six) hours as needed. 06/25/19   Drenda Freeze, MD  ibuprofen (ADVIL) 600 MG tablet Take 1 tablet (600 mg total) by mouth every 6 (six) hours as needed. 06/25/19   Drenda Freeze, MD  lamoTRIgine (LAMICTAL) 200 MG tablet Take 1 tablet (200 mg total) by mouth daily. 11/27/18 06/25/19  Thayer Headings, PMHNP  Multiple Vitamins-Minerals (CENTRUM MEN PO) Take by mouth.    [provider]  Probiotic Product (PROBIOTIC PO) Take by mouth.    [provider]  rosuvastatin (CRESTOR) 10 MG tablet TAKE 1 TABLET(10 MG) BY MOUTH DAILY Patient taking differently: Take 10 mg by mouth daily.  05/22/19   [place] Callas, NP  sildenafil (VIAGRA) 25 MG tablet Take 2-4 tablets (50-100 mg total) by mouth daily as needed for erectile dysfunction. Patient not taking: Reported on 06/25/2019 09/24/18   [place] Callas, NP  tamsulosin (FLOMAX) 0.4 MG CAPS capsule Take 1 capsule (0.4 mg total) by mouth daily. 06/25/19   Drenda Freeze, MD  [place] 600-50-300 MG tablet TAKE 1 TABLET BY MOUTH ONCE DAILY IN THE MORNING WITH OR WITHOUT FOOD.STORE IN ORIGINAL CONTAINER AT ROOM TEMPERATURE. Patient taking differently: Take 1 tablet by mouth daily.  05/21/19   Dixon, Melton Krebs, NP  VIIBRYD 20 MG TABS TAKE 1 TABLET(20 MG) BY MOUTH DAILY Patient taking differently: Take 20 mg by mouth  daily.  06/16/19   Thayer Headings, PMHNP    Family History Family History  Problem Relation Age of Onset  . Cancer Maternal Grandmother   . Tremor Father   . Eating disorder Sister   . Depression Sister   . Tremor Paternal Uncle   . Tremor Paternal Grandfather   . Eating disorder Sister   . Eating disorder Sister   . Arthritis Neg Hx   . Lupus Neg Hx   . Rosacea Neg Hx     Social History Social History   Tobacco Use  . Smoking status: Former Smoker    Packs/day: 0.75    Years: 25.00    Pack years: 18.75    Types: Cigarettes    Quit date: 03/04/2016    Years since quitting: 3.3  . Smokeless tobacco: Never Used  Substance Use Topics  . Alcohol use: No  Alcohol/week: 0.0 standard drinks    Comment: in recovery  . Drug use: No    Comment: in recovery     Allergies   Patient has no known allergies.   Review of Systems Review of Systems  Genitourinary: Positive for flank pain.  All other systems reviewed and are negative.    Physical Exam Updated Vital Signs BP 136/85   Pulse 70   Temp 98 F (36.7 C) (Oral)   Resp 18   Ht 5\' 8"  (1.727 m)   Wt 63.5 kg   SpO2 99%   BMI 21.29 kg/m   Physical Exam Vitals signs and nursing note reviewed.  Constitutional:      General: He is not in acute distress.    Appearance: He is well-developed.  HENT:     Head: Atraumatic.  Eyes:     Conjunctiva/sclera: Conjunctivae normal.  Neck:     Musculoskeletal: Neck supple.  Cardiovascular:     Rate and Rhythm: Normal rate and regular rhythm.     Pulses: Normal pulses.     Heart sounds: Normal heart sounds.  Pulmonary:     Effort: Pulmonary effort is normal.     Breath sounds: Normal breath sounds.  Abdominal:     Palpations: Abdomen is soft.     Tenderness: There is no abdominal tenderness. There is left CVA tenderness. There is no right CVA tenderness.  Skin:    Findings: No rash.  Neurological:     Mental Status: He is alert.      ED Treatments / Results   Labs (all labs ordered are listed, but only abnormal results are displayed) Labs Reviewed  URINALYSIS, ROUTINE W REFLEX MICROSCOPIC - Abnormal; Notable for the following components:      Result Value   Hgb urine dipstick SMALL (*)    Bacteria, UA RARE (*)    All other components within normal limits  BASIC METABOLIC PANEL - Abnormal; Notable for the following components:   Glucose, Bld 110 (*)    Creatinine, Ser 1.29 (*)    All other components within normal limits  NOVEL CORONAVIRUS, NAA (HOSP ORDER, SEND-OUT TO REF LAB; TAT 18-24 HRS)  CBC    EKG None  Radiology US Renal  Result Date: 07/02/2019 CLINICAL DATA:  43 year old male with left flank pain. EXAM: RENAL / URINARY TRACT ULTRASOUND COMPLETE COMPARISON:  CT of the abdomen pelvis dated 06/25/2019 FINDINGS: Right Kidney: Renal measurements: 12.5 x 4.4 x 5.7 cm = volume: 165 mL. Normal echogenicity. No hydronephrosis or shadowing stone. Left Kidney: Renal measurements: 12.8 x 7 0 x 6.6 cm = volume: 310 mL. Normal echogenicity. There is mild left hydronephrosis. Small perinephric fluid is noted. No calcified stone identified. Bladder: Appears normal for degree of bladder distention. The ureteral jets are not visualized. Patient was not able to void. IMPRESSION: Mild left hydronephrosis with small left perinephric fluid. Electronically Signed   By: Elgie Collard M.D.   On: 07/02/2019 14:26    Procedures Procedures (including critical care time)  Medications Ordered in ED Medications  sodium chloride 0.9 % bolus 500 mL (has no administration in time range)  HYDROmorphone (DILAUDID) injection 1 mg (1 mg Intravenous Given 07/02/19 1343)  ondansetron (ZOFRAN) injection 4 mg (4 mg Intravenous Given 07/02/19 1343)  HYDROmorphone (DILAUDID) injection 1 mg (1 mg Intravenous Given 07/02/19 1442)     Initial Impression / Assessment and Plan / ED Course  I have reviewed the triage vital signs and the nursing notes.  Pertinent labs &  imaging results that were available during my care of the patient were reviewed by me and considered in my medical decision making (see chart for details).        BP 136/85   Pulse 70   Temp 98 F (36.7 C) (Oral)   Resp 18   Ht 5\' 8"  (1.727 m)   Wt 63.5 kg   SpO2 99%   BMI 21.29 kg/m    Final Clinical Impressions(s) / ED Diagnoses   Final diagnoses:  Left flank pain    ED Discharge Orders         Ordered    ibuprofen (ADVIL) 600 MG tablet  Every 6 hours PRN     07/02/19 1539         1:32 PM Patient was diagnosed with a left obstructive kidney stones approximately 5 mm in size approximately a week ago.  Pain resolved however now return.  Unsure if patient have passed a stone yet will obtain renal ultrasound for further assessment.  Will provide pain medication.  He does exhibit some mild AKI with a creatinine of 1.29.  UA is currently pending.  2:30 PM Renal US shows mild left hydronephrosis with small left perinephric fluid but no calcified stone identified.  Pt still in moderate discomfort.  Will provider additional pain medication.  IVF given since pt still unable to void.   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    Frank BatmanGuillermo Stum was evaluated in Emergency Department on 07/02/2019 for the symptoms described in the history of present illness. He was evaluated in the context of the global COVID-19 pandemic, which necessitated consideration that the patient might be at risk for infection with the SARS-CoV-2 virus that causes COVID-19. Institutional protocols and algorithms that pertain to the evaluation of patients at risk for COVID-19 are in a state of rapid change based on information released by regulatory bodies including the CDC and federal and state organizations. These policies and algorithms were followed during the patient's care in the ED.  3:38 PM UA without signs of urinary tract infection.   After receiving pain medication patient felt much better.  He did receive IV fluid for mild AKI.  At this time he is stable for discharge recommend follow-up with urologist for further care.  Return precautions discussed.    Fayrene Helperran, Prentis Langdon, PA-C 07/02/19 1541    Jacalyn LefevreHaviland, Julie, MD 07/03/19 [id number]

## 2019-07-02 NOTE — ED Notes (Signed)
Patient transported to CT 

## 2019-07-02 NOTE — Discharge Instructions (Signed)
Your left flank pain is likely related to recent kidney stone.  Fortunately your urine did not show any signs of infection.  Take ibuprofen as needed for pain and follow-up with urologist for further care.  Return if you have any concern.  A COVID-19 test was obtained today, you will be notified if test positive.

## 2019-07-02 NOTE — ED Triage Notes (Signed)
Pt states he was diagnosed with kidney stones last Thursday. Pt's symptoms went away after he went home so he thought he passed the stone. This morning, pt developed terrible left flank pain. Feels like his stone is back.

## 2019-07-02 NOTE — ED Notes (Signed)
Patient Alert and oriented to baseline. Stable and ambulatory to baseline. Patient verbalized understanding of the discharge instructions.  Patient belongings were taken by the patient.   

## 2019-07-13 ENCOUNTER — Other Ambulatory Visit: Payer: Self-pay | Admitting: Internal Medicine

## 2019-07-13 DIAGNOSIS — R160 Hepatomegaly, not elsewhere classified: Secondary | ICD-10-CM

## 2019-07-26 ENCOUNTER — Other Ambulatory Visit: Payer: Self-pay | Admitting: Psychiatry

## 2019-07-26 DIAGNOSIS — F4310 Post-traumatic stress disorder, unspecified: Secondary | ICD-10-CM

## 2019-07-30 ENCOUNTER — Other Ambulatory Visit: Payer: Self-pay

## 2019-07-30 ENCOUNTER — Ambulatory Visit
Admission: RE | Admit: 2019-07-30 | Discharge: 2019-07-30 | Disposition: A | Discharge status: Home / Self Care | Payer: BC Managed Care – PPO | Source: Ambulatory Visit | Attending: Internal Medicine | Admitting: Internal Medicine

## 2019-07-30 DIAGNOSIS — R160 Hepatomegaly, not elsewhere classified: Secondary | ICD-10-CM

## 2019-07-30 MED ORDER — GADOBENATE DIMEGLUMINE 529 MG/ML IV SOLN
13.0000 mL | Freq: Once | INTRAVENOUS | Status: AC | PRN
  Administered 2019-07-30: 13 mL via INTRAVENOUS

## 2019-08-03 ENCOUNTER — Other Ambulatory Visit: Payer: Self-pay

## 2019-08-03 ENCOUNTER — Ambulatory Visit (INDEPENDENT_AMBULATORY_CARE_PROVIDER_SITE_OTHER)
Admission: RE | Admit: 2019-08-03 | Discharge: 2019-08-03 | Disposition: A | Discharge status: Home / Self Care | Payer: Self-pay | Source: Ambulatory Visit | Attending: Internal Medicine | Admitting: Internal Medicine

## 2019-08-03 DIAGNOSIS — E785 Hyperlipidemia, unspecified: Secondary | ICD-10-CM

## 2019-08-11 ENCOUNTER — Ambulatory Visit (INDEPENDENT_AMBULATORY_CARE_PROVIDER_SITE_OTHER): Payer: BC Managed Care – PPO | Admitting: Psychiatry

## 2019-08-11 ENCOUNTER — Encounter: Payer: Self-pay | Admitting: Psychiatry

## 2019-08-11 ENCOUNTER — Other Ambulatory Visit: Payer: Self-pay

## 2019-08-11 DIAGNOSIS — F39 Unspecified mood [affective] disorder: Secondary | ICD-10-CM | POA: Diagnosis not present

## 2019-08-11 DIAGNOSIS — F4310 Post-traumatic stress disorder, unspecified: Secondary | ICD-10-CM | POA: Diagnosis not present

## 2019-08-11 MED ORDER — BUSPIRONE HCL 30 MG PO TABS: 30.0000 mg | ORAL_TABLET | Freq: Two times a day (BID) | ORAL | 0 refills | Status: DC

## 2019-08-11 MED ORDER — LAMOTRIGINE 200 MG PO TABS: 200.0000 mg | ORAL_TABLET | Freq: Every day | ORAL | 0 refills | Status: DC

## 2019-08-11 MED ORDER — VIIBRYD 20 MG PO TABS: 30.0000 mg | ORAL_TABLET | Freq: Every day | ORAL | 0 refills | Status: DC

## 2019-08-11 NOTE — Progress Notes (Signed)
Frank Webster [id number] 1976/01/17 43 y.o.  Virtual Visit via Telephone Note  I connected with pt on 08/11/19 at 10:00 AM EDT by telephone and verified that I am speaking with the correct person using two identifiers.   I discussed the limitations, risks, security and privacy concerns of performing an evaluation and management service by telephone and the availability of in person appointments. I also discussed with the patient that there may be a patient responsible charge related to this service. The patient expressed understanding and agreed to proceed.   I discussed the assessment and treatment plan with the patient. The patient was provided an opportunity to ask questions and all were answered. The patient agreed with the plan and demonstrated an understanding of the instructions.   The patient was advised to call back or seek an in-person evaluation if the symptoms worsen or if the condition fails to improve as anticipated.  I provided 25 minutes of non-face-to-face time during this encounter.  The patient was located at home.  The provider was located at [facility].   Corie Chiquito, PMHNP   Subjective:   Patient ID:  Frank Webster is a 43 y.o. (DOB Jun 28, 1976) male.  Chief Complaint:  Chief Complaint  Patient presents with  . Anxiety  . Depression    HPI Frank Webster presents for follow-up of anxiety, mood lability, and insomnia. He reports that he has noticed some mild depression with difficulty getting out of bed and wanting to socially isolate and social interaction has been exhausting. He has been working from home. He reports that he is sleeping excessively. He denies excessive tearfulness and is only crying in response to occ relationship stressors. "I feel very numb and I feel very detached." He reports that he has a few friends that need his support and he feels overwhelmed by this. Reports that he had anxiety about driving to see friend and  taking his puppy that usually cries with car rides. Denies physical s/s with anxiety. Denies excessive worry or rumination, aside from some worry about his relationship. Denies any intrusive memories, re-experiencing, or nightmares. Has had some irritability. Denies impulsive or risky behavior. Denies SI.   "Once, I get started I am fine" and able to concentrate and be productive. He denies excessive energy. Denies change in appetite. He reports that he often will not eat until the afternoon.   He reports that his relationship has been "on again, off again" in response to significant other's substance dependence.   He reports limited current psychosocial supports. Has not been attending online meetings. Denies relapse or cravings to use.  Past medication trials: Prozac-vomiting Celexa Cymbalta Viibryd BuSpar- Has been helpful for anxiety Wellbutrin Lamictal Trileptal Trazodone Doxazosin Clonidine Trazodone-excessive somnolence, vivid dreams Doxepin-ineffective for insomnia Belsomra- adverse reaction Lunesta- Helped for about 2 nights and then no longer effective.  Klonopin- Effective for insomnia. Hydroxyzine- ineffective. Melatonin-Some benefit but does not sleep throughout the night. Gabapentin  Review of Systems:  Review of Systems  Endocrine:       Reports that he was told he has pre-DM  Genitourinary:       Had kidney stone and 2 ED visits r/t this.  Musculoskeletal: Negative for gait problem.  Neurological:       Sciatic nerve pain.   Psychiatric/Behavioral:       Please refer to HPI    Medications: I have reviewed the patient's current medications.  Current Outpatient Medications  Medication Sig Dispense Refill  . busPIRone (BUSPAR) 30  MG tablet Take 1 tablet (30 mg total) by mouth 2 (two) times daily. 180 tablet 0  . cetirizine (ZYRTEC) 10 MG tablet Take 10 mg by mouth daily.    . clonazePAM (KLONOPIN) 0.5 MG tablet Take 0.25-0.5 mg by mouth at bedtime as  needed.     . fluticasone (FLONASE) 50 MCG/ACT nasal spray Place 2 sprays into both nostrils daily. (Patient taking differently: Place 2 sprays into both nostrils as needed for allergies. ) 16 g 0  . ibuprofen (ADVIL) 600 MG tablet Take 1 tablet (600 mg total) by mouth every 6 (six) hours as needed for moderate pain. 30 tablet 0  . Multiple Vitamins-Minerals (CENTRUM MEN PO) Take by mouth.    . Probiotic Product (PROBIOTIC PO) Take by mouth.    . rosuvastatin (CRESTOR) 10 MG tablet TAKE 1 TABLET(10 MG) BY MOUTH DAILY (Patient taking differently: Take 10 mg by mouth daily. ) 30 tablet 5  . TRIUMEQ 600-50-300 MG tablet TAKE 1 TABLET BY MOUTH ONCE DAILY IN THE MORNING WITH OR WITHOUT FOOD.STORE IN ORIGINAL CONTAINER AT ROOM TEMPERATURE. (Patient taking differently: Take 1 tablet by mouth daily. ) 30 tablet 5  . Vilazodone HCl (VIIBRYD) 20 MG TABS Take 1.5 tablets (30 mg total) by mouth daily. 135 tablet 0  . HYDROcodone-acetaminophen (NORCO) 5-325 MG tablet Take 1 tablet by mouth every 6 (six) hours as needed. (Patient not taking: Reported on 08/11/2019) 12 tablet 0  . lamoTRIgine (LAMICTAL) 200 MG tablet Take 1 tablet (200 mg total) by mouth daily. 30 tablet 0  . sildenafil (VIAGRA) 25 MG tablet Take 2-4 tablets (50-100 mg total) by mouth daily as needed for erectile dysfunction. (Patient not taking: Reported on 08/11/2019) 10 tablet 2  . tamsulosin (FLOMAX) 0.4 MG CAPS capsule Take 1 capsule (0.4 mg total) by mouth daily. (Patient not taking: Reported on 08/11/2019) 15 capsule 0   No current facility-administered medications for this visit.     Medication Side Effects: None  Allergies: No Known Allergies  Past Medical History:  Diagnosis Date  . Anxiety state 04/05/2016  . CANDIDIASIS, ORAL 10/11/2010   Qualifier: Diagnosis of  By: Sundra AlandFarrington NP, Malvin JohnsBradford    . Decreased libido 04/05/2016  . Depression, major (HCC) 10/31/2010   Qualifier: Diagnosis of  By: Sundra AlandFarrington NP, Malvin JohnsBradford    .  Dermatophytosis of unspecified site 10/24/2010   Qualifier: Diagnosis of  By: Sundra AlandFarrington NP, Malvin JohnsBradford    . Erectile dysfunction 09/26/2017  . FOLLICULITIS 10/24/2010   Qualifier: Diagnosis of  By: Sundra AlandFarrington NP, Malvin JohnsBradford    . Glossitis 10/03/2010   Qualifier: Diagnosis of  By: Sundra AlandFarrington NP, Malvin JohnsBradford    . Hemorrhoid 09/29/2012  . HIV infection (HCC)   . Inflamed seborrheic keratosis 10/11/2010   Qualifier: Diagnosis of  By: Sundra AlandFarrington NP, Malvin JohnsBradford    . Left shoulder pain 03/17/2015  . Nonallopathic lesion of cervical region 04/14/2015  . Nonallopathic lesion of lumbosacral region 04/14/2015  . Nonallopathic lesion of thoracic region 04/14/2015  . Other specified disorder of rectum and anus 10/03/2010   Qualifier: Diagnosis of  By: Sundra AlandFarrington NP, Malvin JohnsBradford    . OTITIS EXTERNA 06/14/2010   Qualifier: Diagnosis of  By: Philipp DeputyVollmer MD, Tresa EndoKelly    . Pruritus ani 10/24/2010   Qualifier: Diagnosis of  By: Sundra AlandFarrington NP, Malvin JohnsBradford    . Racing heart beat 04/21/2015  . Scapular dyskinesis 03/24/2015  . Seasonal allergies 01/27/2014  . Sprain of acromioclavicular joint 03/17/2015  . STD exposure 09/26/2017  . Substance abuse (HCC)  01/27/2014  . Substance abuse in remission ([place])   . Testicular/scrotal pain 04/21/2015  . Tobacco abuse 03/22/2015  . URI 10/31/2010   Qualifier: Diagnosis of  By: Magdalen Spatz NP, Romona Curls    . 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 Cancer Maternal Grandmother   . Tremor Father   . Eating disorder Sister   . Depression Sister   . Tremor Paternal Uncle   . Tremor Paternal Grandfather   . Eating disorder Sister   . Eating disorder Sister   . Arthritis Neg Hx   . Lupus Neg Hx   . Rosacea Neg Hx     Social History   Socioeconomic History  . Marital status: Single    Spouse name: Not on file  . Number of children: 0  . Years of education: 90  . Highest education level: Not on file   Occupational History  . Occupation: Risk analyst  . Financial resource strain: Not on file  . Food insecurity    Worry: Not on file    Inability: Not on file  . Transportation needs    Medical: Not on file    Non-medical: Not on file  Tobacco Use  . Smoking status: Former Smoker    Packs/day: 0.75    Years: 25.00    Pack years: 18.75    Types: Cigarettes    Quit date: 03/04/2016    Years since quitting: 3.4  . Smokeless tobacco: Never Used  Substance and Sexual Activity  . 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 Sexual activity: Yes    Partners: Male    Birth control/protection: Condom  Lifestyle  . Physical activity    Days per week: Not on file    Minutes per session: Not on file  . Stress: Not on file  Relationships  . Social Herbalist on phone: Not on file    Gets together: Not on file    Attends religious service: Not on file    Active member of club or organization: Not on file    Attends meetings of clubs or organizations: Not on file    Relationship status: Not on file  . Intimate partner violence    Fear of current or ex partner: Not on file    Emotionally abused: Not on file    Physically abused: Not on file    Forced sexual activity: Not on file  Other Topics Concern  . Not on file  Social History Narrative   Fun: Yoga, hiking, going to the gym.    Past Medical History, Surgical history, Social history, and Family history were reviewed and updated as appropriate.   Please see review of systems for further details on the patient's review from today.   Objective:   Physical Exam:  There were no vitals taken for this visit.  Physical Exam Neurological:     Mental Status: He is alert and oriented to person, place, and time.     Cranial Nerves: No dysarthria.  Psychiatric:        Attention and Perception: Attention normal.        Mood and Affect:  Mood is anxious and depressed.        Speech: Speech normal.  Behavior: Behavior is cooperative.        Thought Content: Thought content normal. Thought content is not paranoid or delusional. Thought content does not include homicidal or suicidal ideation. Thought content does not include homicidal or suicidal plan.        Cognition and Memory: Cognition and memory normal.        Judgment: Judgment normal.     Comments: Insight intact     Lab Review:     Component Value Date/Time   NA 135 07/02/2019 1246   K 3.9 07/02/2019 1246   CL 98 07/02/2019 1246   CO2 31 07/02/2019 1246   GLUCOSE 110 (H) 07/02/2019 1246   GLUCOSE 76 02/28/2009   BUN 20 07/02/2019 1246   CREATININE 1.29 (H) 07/02/2019 1246   CREATININE 0.84 09/08/2018 1151   CALCIUM 9.6 07/02/2019 1246   PROT 7.1 09/08/2018 1151   ALBUMIN 4.6 05/29/2016 1441   AST 26 09/08/2018 1151   ALT 45 09/08/2018 1151   ALKPHOS 70 05/29/2016 1441   BILITOT 0.4 09/08/2018 1151   GFRNONAA >60 07/02/2019 1246   GFRNONAA 106 09/08/2018 1151   GFRAA >60 07/02/2019 1246   GFRAA 123 09/08/2018 1151       Component Value Date/Time   WBC 9.9 07/02/2019 1246   RBC 4.43 07/02/2019 1246   HGB 14.1 07/02/2019 1246   HCT 41.3 07/02/2019 1246   PLT 278 07/02/2019 1246   MCV 93.2 07/02/2019 1246   MCH 31.8 07/02/2019 1246   MCHC 34.1 07/02/2019 1246   RDW 12.3 07/02/2019 1246   LYMPHSABS 2,604 09/08/2018 1151   MONOABS 360 05/29/2016 1441   EOSABS 50 09/08/2018 1151   BASOSABS 11 09/08/2018 1151    No results found for: POCLITH, LITHIUM   No results found for: PHENYTOIN, PHENOBARB, VALPROATE, CBMZ   .res Assessment: Plan:   Patient seen for 30 minutes and greater than 50% of visit spent counseling patient and coordination of care to include discussing that it may be beneficial for patient to resume psychotherapy considering current psychosocial stressors that seem to be exacerbating anxiety.  Patient is in agreement with  this and was assisted with scheduling appointment to see Fred May, LPC. Discussed that patient seems to be having current exacerbation of anxiety which in turn is causing some depressive signs and symptoms.  Discussed potential benefits, risks, and side effects of increasing Viibryd to 30 mg daily to target both anxiety and depressive signs and symptoms.  Patient is in agreement with plan to increase Viibryd.  Discussed considering possible dose reduction of lamotrigine in the future based upon response to increase in Viibryd. Continue BuSpar for anxiety. Patient to follow-up in 4 weeks or sooner if clinically indicated. Patient advised to contact office with any questions, adverse effects, or acute worsening in signs and symptoms.  Bronc was seen today for anxiety and depression.  Diagnoses and all orders for this visit:  Mood disorder (HCC) -     Vilazodone HCl (VIIBRYD) 20 MG TABS; Take 1.5 tablets (30 mg total) by mouth daily. -     lamoTRIgine (LAMICTAL) 200 MG tablet; Take 1 tablet (200 mg total) by mouth daily.  PTSD (post-traumatic stress disorder) -     Vilazodone HCl (VIIBRYD) 20 MG TABS; Take 1.5 tablets (30 mg total) by mouth daily. -     busPIRone (BUSPAR) 30 MG tablet; Take 1 tablet (30 mg total) by mouth 2 (two) times daily.    Please see After Visit Summary for  patient specific instructions.  Future Appointments  Date Time Provider Department Center  08/31/2019  2:45 PM [facility]  09/23/2019  2:45 PM Blanchard Kelch, NP [facility]    No orders of the defined types were placed in this encounter.     -------------------------------

## 2019-08-18 ENCOUNTER — Telehealth: Payer: Self-pay | Admitting: Psychiatry

## 2019-08-18 NOTE — Telephone Encounter (Signed)
Got letter from [place] stating that his insurance will not cover Viibrydd next year. Not sure if there is a PA option, but he wants a call to discuss other options on meds.

## 2019-08-19 ENCOUNTER — Other Ambulatory Visit: Payer: Self-pay

## 2019-08-19 DIAGNOSIS — Z20822 Contact with and (suspected) exposure to covid-19: Secondary | ICD-10-CM

## 2019-08-19 NOTE — Telephone Encounter (Signed)
Left voicemail that it sounds like his insurance is just requiring a PA to be completed so shouldn't be a problem

## 2019-08-21 LAB — NOVEL CORONAVIRUS, NAA: SARS-CoV-2, NAA: NOT DETECTED

## 2019-08-26 ENCOUNTER — Encounter: Payer: Self-pay | Admitting: Psychiatry

## 2019-08-26 ENCOUNTER — Other Ambulatory Visit: Payer: Self-pay

## 2019-08-26 ENCOUNTER — Ambulatory Visit (INDEPENDENT_AMBULATORY_CARE_PROVIDER_SITE_OTHER): Payer: BC Managed Care – PPO | Admitting: Psychiatry

## 2019-08-26 DIAGNOSIS — F331 Major depressive disorder, recurrent, moderate: Secondary | ICD-10-CM | POA: Diagnosis not present

## 2019-08-26 NOTE — Progress Notes (Signed)
      [facility] Counselor/Therapist Progress Note  Patient ID: Frank Webster, MRN: [id number],    Date: 08/26/2019  Time Spent: 50 minutes   Treatment Type: Individual Therapy  Reported Symptoms: anxious, depressed.  Mental Status Exam:  Appearance:   Casual     Behavior:  Appropriate  Motor:  Normal  Speech/Language:   Clear and Coherent  Affect:  Appropriate  Mood:  anxious, depressed and sad  Thought process:  normal  Thought content:    WNL  Sensory/Perceptual disturbances:    WNL  Orientation:  oriented to person, place, time/date and situation  Attention:  Good  Concentration:  Good  Memory:  WNL  Fund of knowledge:   Good  Insight:    Good  Judgment:   Good  Impulse Control:  Good   Risk Assessment: Danger to Self:  No Self-injurious Behavior: No Danger to Others: No Duty to Warn:no Physical Aggression / Violence:No  Access to Firearms a concern: No  Gang Involvement:No   Subjective: The client states in the last month that he had suffered from a kidney stone which was excruciating.  His recent physical showed some heart issues with hyperlipidemia and that he was prediabetic.  "My mood is not good."  He currently takes 30 mg of Viibryd. In June 2019 the client had started a relationship with a young man 52 years his junior.  "Within 3 months I saw a red flags."  The client went on to discuss how there were many things that were difficult in the relationship that he continually overlooked.  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  He moved in without the client realizing it.  "I understand his pain and what he has been through."  The client's compassion and empathy has worked against him.  He knows he is codependent but taking responsibility for his boyfriends emotional pain ends up keeping the boyfriend from growing past his problems He agrees. The client wants to focus on reducing his codependence and decreasing his emotional  connection to his ex-boyfriend.  The boyfriend moved out 3 weeks ago of his own volition.  The client had tried to stop him from going on a drug binge by blocking his access to his money.  This made the boyfriend very angry.  He posted on social media that the client had relapsed and was using drugs.  This was untrue and infuriated the client.  We discussed that he needs to block the number.  Otherwise when he is contacted he will most likely relent and get reengaged.  The client agreed. At the next session we will define treatment plan more concretely.  Interventions: Motivational Interviewing, Solution-Oriented/Positive Psychology and Insight-Oriented  Diagnosis:   ICD-10-CM   1. Major depressive disorder, recurrent episode, moderate (HCC)  F33.1     Plan: Block ex-boyfriend's number, boundaries, assertiveness, self-care, positive self talk, mood independent behavior.  Chantrell Apsey, [facility]

## 2019-08-31 ENCOUNTER — Other Ambulatory Visit: Payer: BC Managed Care – PPO

## 2019-08-31 ENCOUNTER — Other Ambulatory Visit: Payer: Self-pay

## 2019-08-31 DIAGNOSIS — Z21 Asymptomatic human immunodeficiency virus [HIV] infection status: Secondary | ICD-10-CM

## 2019-08-31 DIAGNOSIS — Z113 Encounter for screening for infections with a predominantly sexual mode of transmission: Secondary | ICD-10-CM

## 2019-08-31 DIAGNOSIS — Z79899 Other long term (current) drug therapy: Secondary | ICD-10-CM

## 2019-09-01 ENCOUNTER — Encounter: Payer: Self-pay | Admitting: Infectious Diseases

## 2019-09-01 LAB — T-HELPER CELL (CD4) - (RCID CLINIC ONLY)
CD4 % Helper T Cell: 43 % (ref 33–65)
CD4 T Cell Abs: 844 /uL (ref 400–1790)

## 2019-09-08 ENCOUNTER — Ambulatory Visit (INDEPENDENT_AMBULATORY_CARE_PROVIDER_SITE_OTHER): Payer: BC Managed Care – PPO | Admitting: Psychiatry

## 2019-09-08 ENCOUNTER — Encounter: Payer: Self-pay | Admitting: Psychiatry

## 2019-09-08 ENCOUNTER — Other Ambulatory Visit: Payer: Self-pay

## 2019-09-08 DIAGNOSIS — F4310 Post-traumatic stress disorder, unspecified: Secondary | ICD-10-CM | POA: Diagnosis not present

## 2019-09-08 DIAGNOSIS — F39 Unspecified mood [affective] disorder: Secondary | ICD-10-CM

## 2019-09-08 DIAGNOSIS — F5101 Primary insomnia: Secondary | ICD-10-CM

## 2019-09-08 MED ORDER — LAMOTRIGINE 200 MG PO TABS: 200.0000 mg | ORAL_TABLET | Freq: Every day | ORAL | 0 refills | Status: DC

## 2019-09-08 MED ORDER — VILAZODONE HCL 40 MG PO TABS: 40.0000 mg | ORAL_TABLET | Freq: Every day | ORAL | 0 refills | Status: DC

## 2019-09-08 MED ORDER — BUSPIRONE HCL 30 MG PO TABS: 30.0000 mg | ORAL_TABLET | Freq: Two times a day (BID) | ORAL | 0 refills | Status: DC

## 2019-09-08 NOTE — Progress Notes (Signed)
Frank Webster [id number] 02-27-1976 43 y.o.  Virtual Visit via Telephone Note  I connected with pt on 09/08/19 at  1:30 PM EST by telephone and verified that I am speaking with the correct person using two identifiers.   I discussed the limitations, risks, security and privacy concerns of performing an evaluation and management service by telephone and the availability of in person appointments. I also discussed with the patient that there may be a patient responsible charge related to this service. The patient expressed understanding and agreed to proceed.   I discussed the assessment and treatment plan with the patient. The patient was provided an opportunity to ask questions and all were answered. The patient agreed with the plan and demonstrated an understanding of the instructions.   The patient was advised to call back or seek an in-person evaluation if the symptoms worsen or if the condition fails to improve as anticipated.  I provided 25 minutes of non-face-to-face time during this encounter.  The patient was located at home.  The provider was located at [place].   Thayer Headings, PMHNP   Subjective:   Patient ID:  Frank Webster is a 43 y.o. (DOB September 18, 1976) male.  Chief Complaint:  Chief Complaint  Patient presents with  . Depression  . Anxiety    HPI Frank Webster presents for follow-up of depression and anxiety. He reports some partial improvement with increase in Viibryd but continues to have depressive s/s some days. Mood remains persistently depressed. Denies irritability. He reports, "I get overwhelmed really easy," ie if there are multiple things happening simultaneously and feeling as if he is not going to be able to manage everything. He reports that he felt more anxious after talking with someone that was anxious.   He reports that energy and motivation fluctuate to a significant degree. Describes days where he is more productive at work  and focus, other days energy is very low and he has napped and not been very productive. He reports that he is continues to have consistently low motivation for social interaction. Sleep has been fluctuating and sleeping more at times. Reports that there have been some days he has taken Klonopin because he was thinking that he would not fall asleep otherwise. He reports that his concentration is adequate once he initiates a task. Able to enjoy his puppy. Denies SI.  Denies impulsive or risky behavior.   He reports that he ended relationship with significant other and they have now transitioned to being friends. He reports that his anxiety has been less since the end of the relationship. Reports that he initially experienced some increased depression and loneliness when relationship ended.   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   Past medication trials: Prozac-vomiting Celexa Cymbalta Viibryd BuSpar- Has been helpful for anxiety Wellbutrin Lamictal Trileptal Trazodone Doxazosin Clonidine Trazodone-excessive somnolence, vivid dreams Doxepin-ineffective for insomnia Belsomra- adverse reaction Lunesta- Helped for about 2 nights and then no longer effective.  Klonopin- Effective for insomnia. Hydroxyzine- ineffective. Melatonin-Some benefit but does not sleep throughout the night. Gabapentin   Review of Systems:  Review of Systems  Gastrointestinal: Negative.   Musculoskeletal: Negative for gait problem.  Neurological: Negative for tremors.  Psychiatric/Behavioral:       Please refer to HPI      Medications: I have reviewed the patient's current medications.  Current Outpatient Medications  Medication Sig Dispense Refill  . busPIRone (BUSPAR) 30 MG tablet Take 1 tablet (  30 mg total) by mouth 2 (two) times daily. 180 tablet 0  . cetirizine (ZYRTEC) 10 MG tablet Take 10 mg by mouth daily.    . clonazePAM (KLONOPIN) 0.5 MG tablet Take  0.25-0.5 mg by mouth at bedtime as needed.     . fluticasone (FLONASE) 50 MCG/ACT nasal spray Place 2 sprays into both nostrils daily. (Patient taking differently: Place 2 sprays into both nostrils as needed for allergies. ) 16 g 0  . ibuprofen (ADVIL) 600 MG tablet Take 1 tablet (600 mg total) by mouth every 6 (six) hours as needed for moderate pain. 30 tablet 0  . lamoTRIgine (LAMICTAL) 200 MG tablet Take 1 tablet (200 mg total) by mouth daily. 30 tablet 0  . Multiple Vitamins-Minerals (CENTRUM MEN PO) Take by mouth.    . Probiotic Product (PROBIOTIC PO) Take by mouth.    . rosuvastatin (CRESTOR) 10 MG tablet TAKE 1 TABLET(10 MG) BY MOUTH DAILY (Patient taking differently: Take 10 mg by mouth daily. ) 30 tablet 5  . TRIUMEQ 600-50-300 MG tablet TAKE 1 TABLET BY MOUTH ONCE DAILY IN THE MORNING WITH OR WITHOUT FOOD.STORE IN ORIGINAL CONTAINER AT ROOM TEMPERATURE. (Patient taking differently: Take 1 tablet by mouth daily. ) 30 tablet 5  . Vilazodone HCl (VIIBRYD) 20 MG TABS Take 1.5 tablets (30 mg total) by mouth daily. 135 tablet 0  . HYDROcodone-acetaminophen (NORCO) 5-325 MG tablet Take 1 tablet by mouth every 6 (six) hours as needed. (Patient not taking: Reported on 08/11/2019) 12 tablet 0  . sildenafil (VIAGRA) 25 MG tablet Take 2-4 tablets (50-100 mg total) by mouth daily as needed for erectile dysfunction. (Patient not taking: Reported on 08/11/2019) 10 tablet 2  . tamsulosin (FLOMAX) 0.4 MG CAPS capsule Take 1 capsule (0.4 mg total) by mouth daily. (Patient not taking: Reported on 08/11/2019) 15 capsule 0  . Vilazodone HCl (VIIBRYD) 40 MG TABS Take 1 tablet (40 mg total) by mouth daily. 90 tablet 0   No current facility-administered medications for this visit.     Medication Side Effects: Other: Had some brief nausea after increase in Viibryd  Allergies: No Known Allergies  Past Medical History:  Diagnosis Date  . Anxiety state 04/05/2016  . CANDIDIASIS, ORAL 10/11/2010   Qualifier:  Diagnosis of  By: [name] NP, Malvin Johns    . Decreased libido 04/05/2016  . Depression, major (HCC) 10/31/2010   Qualifier: Diagnosis of  By: [name] NP, Malvin Johns    . Dermatophytosis of unspecified site 10/24/2010   Qualifier: Diagnosis of  By: [name] NP, Malvin Johns    . Erectile dysfunction 09/26/2017  . FOLLICULITIS 10/24/2010   Qualifier: Diagnosis of  By: [name] NP, Malvin Johns    . Glossitis 10/03/2010   Qualifier: Diagnosis of  By: [name] NP, Malvin Johns    . Hemorrhoid 09/29/2012  . HIV infection (HCC)   . Inflamed seborrheic keratosis 10/11/2010   Qualifier: Diagnosis of  By: [name] NP, Malvin Johns    . Left shoulder pain 03/17/2015  . Nonallopathic lesion of cervical region 04/14/2015  . Nonallopathic lesion of lumbosacral region 04/14/2015  . Nonallopathic lesion of thoracic region 04/14/2015  . Other specified disorder of rectum and anus 10/03/2010   Qualifier: Diagnosis of  By: [name] NP, Malvin Johns    . OTITIS EXTERNA 06/14/2010   Qualifier: Diagnosis of  By: Philipp Deputy MD, Tresa Endo    . Pruritus ani 10/24/2010   Qualifier: Diagnosis of  By: [name] NP, Malvin Johns    . Racing heart beat 04/21/2015  . Scapular dyskinesis  03/24/2015  . Seasonal allergies 01/27/2014  . Sprain of acromioclavicular joint 03/17/2015  . STD exposure 09/26/2017  . Substance abuse (HCC) 01/27/2014  . Substance abuse in remission (HCC)   . Testicular/scrotal pain 04/21/2015  . Tobacco abuse 03/22/2015  . URI 10/31/2010   Qualifier: Diagnosis of  By: [name] NP, Malvin Johns    . 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 Cancer Maternal Grandmother   . Tremor Father   . Eating disorder Sister   . Depression Sister   . Tremor Paternal Uncle   . Tremor Paternal Grandfather   . Eating disorder Sister   . Eating disorder Sister   . Arthritis Neg Hx   . Lupus Neg Hx   . Rosacea Neg Hx     Social History    Socioeconomic History  . Marital status: Single    Spouse name: Not on file  . Number of children: 0  . Years of education: 44  . Highest education level: Not on file  Occupational History  . Occupation: Academic librarian  . Financial resource strain: Not on file  . Food insecurity    Worry: Not on file    Inability: Not on file  . Transportation needs    Medical: Not on file    Non-medical: Not on file  Tobacco Use  . Smoking status: Former Smoker    Packs/day: 0.75    Years: 25.00    Pack years: 18.75    Types: Cigarettes    Quit date: 03/04/2016    Years since quitting: 3.5  . Smokeless tobacco: Never Used  Substance and Sexual Activity  . 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 Sexual activity: Yes    Partners: Male    Birth control/protection: Condom  Lifestyle  . Physical activity    Days per week: Not on file    Minutes per session: Not on file  . Stress: Not on file  Relationships  . Social Musician on phone: Not on file    Gets together: Not on file    Attends religious service: Not on file    Active member of club or organization: Not on file    Attends meetings of clubs or organizations: Not on file    Relationship status: Not on file  . Intimate partner violence    Fear of current or ex partner: Not on file    Emotionally abused: Not on file    Physically abused: Not on file    Forced sexual activity: Not on file  Other Topics Concern  . Not on file  Social History Narrative   Fun: Yoga, hiking, going to the gym.     Past Medical History, Surgical history, Social history, and Family history were reviewed and updated as appropriate.   Please see review of systems for further details on the patient's review from today.   Objective:   Physical Exam:  There were no vitals taken for this visit.  Physical Exam Neurological:     Mental Status: He  is alert and oriented to person, place, and time.     Cranial Nerves: No dysarthria.  Psychiatric:        Attention and Perception: Attention normal.  Mood and Affect: Mood is depressed.        Speech: Speech normal.        Behavior: Behavior is cooperative.        Thought Content: Thought content normal. Thought content is not paranoid or delusional. Thought content does not include homicidal or suicidal ideation. Thought content does not include homicidal or suicidal plan.        Cognition and Memory: Cognition and memory normal.        Judgment: Judgment normal.     Comments: Insight intact     Lab Review:     Component Value Date/Time   NA 138 08/31/2019 1436   K 4.4 08/31/2019 1436   CL 101 08/31/2019 1436   CO2 30 08/31/2019 1436   GLUCOSE 90 08/31/2019 1436   GLUCOSE 76 02/28/2009   BUN 16 08/31/2019 1436   CREATININE 0.96 08/31/2019 1436   CALCIUM 9.9 08/31/2019 1436   PROT 7.0 08/31/2019 1436   ALBUMIN 4.6 05/29/2016 1441   AST 20 08/31/2019 1436   ALT 29 08/31/2019 1436   ALKPHOS 70 05/29/2016 1441   BILITOT 0.4 08/31/2019 1436   GFRNONAA 95 08/31/2019 1436   GFRAA 110 08/31/2019 1436       Component Value Date/Time   WBC 5.4 08/31/2019 1436   RBC 4.51 08/31/2019 1436   HGB 14.5 08/31/2019 1436   HCT 42.4 08/31/2019 1436   PLT 278 08/31/2019 1436   MCV 94.0 08/31/2019 1436   MCH 32.2 08/31/2019 1436   MCHC 34.2 08/31/2019 1436   RDW 13.2 08/31/2019 1436   LYMPHSABS 1,658 08/31/2019 1436   MONOABS 360 05/29/2016 1441   EOSABS 38 08/31/2019 1436   BASOSABS 11 08/31/2019 1436    No results found for: POCLITH, LITHIUM   No results found for: PHENYTOIN, PHENOBARB, VALPROATE, CBMZ   .res Assessment: Plan:   Pt seen for 30 minutes and greater than 50% of visit spent counseling pt re: potential benefits, risks, and side effects of increase in Viibryd to 40 mg po qd. Pt agrees to increase in Viibryd. Discussed continuing Lamictal and Buspar at this  time and considering future dose reductions in the future if mood and anxiety s/s are well controlled with increase in Viibryd. Pt is in agreement with this plan.  Continue Klonopin per sleep specialist.  Recommend continuing psychotherapy with Sherron MondayFred May, LPC. Pt to f/u in 6 weeks or sooner if clinically indicated.  Patient advised to contact office with any questions, adverse effects, or acute worsening in signs and symptoms.  Frank Webster was seen today for depression and anxiety.  Diagnoses and all orders for this visit:  Primary insomnia  Mood disorder (HCC) -     Vilazodone HCl (VIIBRYD) 40 MG TABS; Take 1 tablet (40 mg total) by mouth daily. -     lamoTRIgine (LAMICTAL) 200 MG tablet; Take 1 tablet (200 mg total) by mouth daily.  PTSD (post-traumatic stress disorder) -     Vilazodone HCl (VIIBRYD) 40 MG TABS; Take 1 tablet (40 mg total) by mouth daily. -     busPIRone (BUSPAR) 30 MG tablet; Take 1 tablet (30 mg total) by mouth 2 (two) times daily.    Please see After Visit Summary for patient specific instructions.  Future Appointments  Date Time Provider Department Center  09/23/2019  2:45 PM Blanchard Kelchixon, Stephanie N, NP [facility]  09/24/2019 11:00 AM May[facility]ene Mink[facility] [place]  10/13/2019  9:00 AM May, [place]  10/14/2019  11:00 AM May, [place], [facility] None  10/20/2019  3:00 PM Hurst, Rosey Bath T, PA-C [facility] None    No orders of the defined types were placed in this encounter.     -------------------------------

## 2019-09-13 LAB — COMPLETE METABOLIC PANEL WITH GFR
AG Ratio: 1.9 (calc) (ref 1.0–2.5)
ALT: 29 U/L (ref 9–46)
AST: 20 U/L (ref 10–40)
Albumin: 4.6 g/dL (ref 3.6–5.1)
Alkaline phosphatase (APISO): 50 U/L (ref 36–130)
BUN: 16 mg/dL (ref 7–25)
CO2: 30 mmol/L (ref 20–32)
Calcium: 9.9 mg/dL (ref 8.6–10.3)
Chloride: 101 mmol/L (ref 98–110)
Creat: 0.96 mg/dL (ref 0.60–1.35)
GFR, Est African American: 110 mL/min/{1.73_m2} (ref >=60)
GFR, Est Non African American: 95 mL/min/{1.73_m2} (ref >=60)
Globulin: 2.4 g/dL (calc) (ref 1.9–3.7)
Glucose, Bld: 90 mg/dL (ref 65–99)
Potassium: 4.4 mmol/L (ref 3.5–5.3)
Sodium: 138 mmol/L (ref 135–146)
Total Bilirubin: 0.4 mg/dL (ref 0.2–1.2)
Total Protein: 7 g/dL (ref 6.1–8.1)

## 2019-09-13 LAB — CBC WITH DIFFERENTIAL/PLATELET
Absolute Monocytes: 410 cells/uL (ref 200–950)
Basophils Absolute: 11 cells/uL (ref 0–200)
Basophils Relative: 0.2 %
Eosinophils Absolute: 38 cells/uL (ref 15–500)
Eosinophils Relative: 0.7 %
HCT: 42.4 % (ref 38.5–50.0)
Hemoglobin: 14.5 g/dL (ref 13.2–17.1)
Lymphs Abs: 1658 cells/uL (ref 850–3900)
MCH: 32.2 pg (ref 27.0–33.0)
MCHC: 34.2 g/dL (ref 32.0–36.0)
MCV: 94 fL (ref 80.0–100.0)
MPV: 9.4 fL (ref 7.5–12.5)
Monocytes Relative: 7.6 %
Neutro Abs: 3283 cells/uL (ref 1500–7800)
Neutrophils Relative %: 60.8 %
Platelets: 278 10*3/uL (ref 140–400)
RBC: 4.51 10*6/uL (ref 4.20–5.80)
RDW: 13.2 % (ref 11.0–15.0)
Total Lymphocyte: 30.7 %
WBC: 5.4 10*3/uL (ref 3.8–10.8)

## 2019-09-13 LAB — HIV-1 RNA QUANT-NO REFLEX-BLD
HIV 1 RNA Quant: <20 copies/mL — AB
HIV-1 RNA Quant, Log: <1.3 Log copies/mL — AB

## 2019-09-13 LAB — LIPID PANEL
Cholesterol: 155 mg/dL (ref <200)
HDL: 36 mg/dL — ABNORMAL LOW (ref >=40)
LDL Cholesterol (Calc): 94 mg/dL (calc)
Non-HDL Cholesterol (Calc): 119 mg/dL (calc) (ref <130)
Total CHOL/HDL Ratio: 4.3 (calc) (ref <5.0)
Triglycerides: 148 mg/dL (ref <150)

## 2019-09-13 LAB — RPR: RPR Ser Ql: NONREACTIVE

## 2019-09-23 ENCOUNTER — Telehealth: Payer: Self-pay | Admitting: Psychiatry

## 2019-09-23 ENCOUNTER — Other Ambulatory Visit: Payer: Self-pay

## 2019-09-23 ENCOUNTER — Telehealth (INDEPENDENT_AMBULATORY_CARE_PROVIDER_SITE_OTHER): Payer: BC Managed Care – PPO | Admitting: Infectious Diseases

## 2019-09-23 DIAGNOSIS — B20 Human immunodeficiency virus [HIV] disease: Secondary | ICD-10-CM | POA: Diagnosis not present

## 2019-09-23 DIAGNOSIS — E7801 Familial hypercholesterolemia: Secondary | ICD-10-CM | POA: Diagnosis not present

## 2019-09-23 DIAGNOSIS — F332 Major depressive disorder, recurrent severe without psychotic features: Secondary | ICD-10-CM

## 2019-09-23 NOTE — Assessment & Plan Note (Signed)
Stable on current dose of crestor 10 mg QD. LDL goal as outlined per Dr. Debara Pickett < 100. Continue with Q44m monitoring

## 2019-09-23 NOTE — Telephone Encounter (Signed)
Noted thank you

## 2019-09-23 NOTE — Assessment & Plan Note (Signed)
Excellent long term control with recent VL < 20 and CD4 in healthy range. Will continue Triumeq for him. He will return in 6 months for labs and another EVisit if he would like. Will check Urine STI screen per routine at this visit.  Appt made next week for immunization update.

## 2019-09-23 NOTE — Progress Notes (Signed)
Name: Frank Webster   DOB: December 22, 1975   MRN: [id number]   PCP: Renford Dills, MD   Virtual Visit via MyChart Video  I connected with [name] on 09/23/19 at  2:45 PM EST by telephone and verified that I am speaking with the correct person using two identifiers.   I discussed the limitations, risks, security and privacy concerns of performing an evaluation and management service by telephone and the availability of in person appointments. I also discussed with the patient that there may be a patient responsible charge related to this service. The patient expressed understanding and agreed to proceed.     Brief Narrative:  Frank Webster is a 43 y.o. male with HIV disease. Has seen Dr. Luciana Axe in the clinic and previously well controlled with low level viremia < 100 copies the last few draws. Diagnosed >22 years ago; had a time where he struggled with adherence 2/2 drug use. OI Hx: none.  HIV Risk: MSM, previous drug use.   Previous Regimens:   Genvoya    Triumeq + Tivicay PM (DDI with Trileptel)  Triumeq >> suppressed   Genotype:   01/2014 - no INSTI resistance, wild type   Patient Active Problem List   Diagnosis Date Noted  . Anxiety state 04/05/2016  . Decreased libido 04/05/2016  . Hyperlipidemia 04/21/2015  . Health care maintenance 02/15/2015  . Screening examination for venereal disease 09/30/2014  . Seasonal allergies 01/27/2014  . Hemorrhoid 09/29/2012  . Depression, major (HCC) 10/31/2010  . Human immunodeficiency virus (HIV) disease (HCC) 02/23/2010    CC:  Routine HIV follow up care.   HPI: Doing better since last office visit. He has had better effect from currently uptitrated antidepressants and out of a toxic relationship which has been a relief for him. He has continued to work full time and no recent illnesses. No hospitalizations or ER visits to declare. He is enjoying time with his dog which is also helpful for mental health.   Reports 100%  adherence to Triumeq without concern for side effects or access to his medications. No symptoms of STI to declare - recently treated at last office visit.   Sleep is better on clonazapam as managed by his psychiatrist.   Would like to discuss more about diet changes he has implemented to help reduce cholesterol and blood sugar.    Review of Systems  Constitutional: Negative for chills, fever, malaise/fatigue and weight loss.  HENT: Negative for sore throat.        No dental problems  Respiratory: Negative for cough and sputum production.   Cardiovascular: Negative for chest pain and leg swelling.  Gastrointestinal: Negative for abdominal pain, diarrhea and vomiting.  Genitourinary: Negative for dysuria and flank pain.  Musculoskeletal: Negative for joint pain, myalgias and neck pain.  Skin: Negative for rash.  Neurological: Negative for dizziness, tingling and headaches.  Psychiatric/Behavioral: Positive for depression. Negative for substance abuse and suicidal ideas. The patient is not nervous/anxious and does not have insomnia.     Past Medical History:  Diagnosis Date  . Anxiety state 04/05/2016  . CANDIDIASIS, ORAL 10/11/2010   Qualifier: Diagnosis of  By: [name] NP, Malvin Johns    . Decreased libido 04/05/2016  . Depression, major (HCC) 10/31/2010   Qualifier: Diagnosis of  By: [name] NP, Malvin Johns    . Dermatophytosis of unspecified site 10/24/2010   Qualifier: Diagnosis of  By: [name] NP, Malvin Johns    . Erectile dysfunction 09/26/2017  . FOLLICULITIS 10/24/2010   Qualifier: Diagnosis  of  By: Magdalen Spatz NP, Romona Curls    . Glossitis 10/03/2010   Qualifier: Diagnosis of  By: Magdalen Spatz NP, Romona Curls    . Hemorrhoid 09/29/2012  . HIV infection ([place])   . Inflamed seborrheic keratosis 10/11/2010   Qualifier: Diagnosis of  By: Magdalen Spatz NP, Romona Curls    . Left shoulder pain 03/17/2015  . Nonallopathic lesion of cervical region 04/14/2015  . Nonallopathic lesion of lumbosacral  region 04/14/2015  . Nonallopathic lesion of thoracic region 04/14/2015  . Other specified disorder of rectum and anus 10/03/2010   Qualifier: Diagnosis of  By: Magdalen Spatz NP, Romona Curls    . OTITIS EXTERNA 06/14/2010   Qualifier: Diagnosis of  By: Tomma Lightning MD, Claiborne Billings    . Pruritus ani 10/24/2010   Qualifier: Diagnosis of  By: Magdalen Spatz NP, Romona Curls    . Racing heart beat 04/21/2015  . Scapular dyskinesis 03/24/2015  . Seasonal allergies 01/27/2014  . Sprain of acromioclavicular joint 03/17/2015  . STD exposure 09/26/2017  . Substance abuse ([place]) 01/27/2014  . Substance abuse in remission ([place])   . Testicular/scrotal pain 04/21/2015  . Tobacco abuse 03/22/2015  . URI 10/31/2010   Qualifier: Diagnosis of  By: Magdalen Spatz NP, Romona Curls    . VINCENTS ANGINA 10/03/2010   Qualifier: Diagnosis of  By: Magdalen Spatz NP, Romona Curls     No Known Allergies  Outpatient Medications Prior to Visit  Medication Sig Dispense Refill  . busPIRone (BUSPAR) 30 MG tablet Take 1 tablet (30 mg total) by mouth 2 (two) times daily. 180 tablet 0  . cetirizine (ZYRTEC) 10 MG tablet Take 10 mg by mouth daily.    . clonazePAM (KLONOPIN) 0.5 MG tablet Take 0.25-0.5 mg by mouth at bedtime as needed.     . fluticasone (FLONASE) 50 MCG/ACT nasal spray Place 2 sprays into both nostrils daily. (Patient taking differently: Place 2 sprays into both nostrils as needed for allergies. ) 16 g 0  . ibuprofen (ADVIL) 600 MG tablet Take 1 tablet (600 mg total) by mouth every 6 (six) hours as needed for moderate pain. 30 tablet 0  . lamoTRIgine (LAMICTAL) 200 MG tablet Take 1 tablet (200 mg total) by mouth daily. 30 tablet 0  . Multiple Vitamins-Minerals (CENTRUM MEN PO) Take by mouth.    . Probiotic Product (PROBIOTIC PO) Take by mouth.    . rosuvastatin (CRESTOR) 10 MG tablet TAKE 1 TABLET(10 MG) BY MOUTH DAILY (Patient taking differently: Take 10 mg by mouth daily. ) 30 tablet 5  . TRIUMEQ 295-62-130 MG tablet TAKE 1 TABLET BY MOUTH ONCE DAILY IN  THE MORNING WITH OR WITHOUT FOOD.STORE IN ORIGINAL CONTAINER AT ROOM TEMPERATURE. (Patient taking differently: Take 1 tablet by mouth daily. ) 30 tablet 5  . Vilazodone HCl (VIIBRYD) 40 MG TABS Take 1 tablet (40 mg total) by mouth daily. 90 tablet 0  . Vilazodone HCl (VIIBRYD) 20 MG TABS Take 1.5 tablets (30 mg total) by mouth daily. 135 tablet 0  . HYDROcodone-acetaminophen (NORCO) 5-325 MG tablet Take 1 tablet by mouth every 6 (six) hours as needed. (Patient not taking: Reported on 09/23/2019) 12 tablet 0  . sildenafil (VIAGRA) 25 MG tablet Take 2-4 tablets (50-100 mg total) by mouth daily as needed for erectile dysfunction. (Patient not taking: Reported on 09/23/2019) 10 tablet 2  . tamsulosin (FLOMAX) 0.4 MG CAPS capsule Take 1 capsule (0.4 mg total) by mouth daily. (Patient not taking: Reported on 09/23/2019) 15 capsule 0   No facility-administered medications prior to visit.  Social History   Tobacco Use  . Smoking status: Former Smoker    Packs/day: 0.75    Years: 25.00    Pack years: 18.75    Types: Cigarettes    Quit date: 03/04/2016    Years since quitting: 3.5  . Smokeless tobacco: Never Used  Substance Use Topics  . 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 Drug use: No    Comment: in recovery   Social History   Substance and Sexual Activity  Sexual Activity Yes  . Partners: Male  . Birth control/protection: Condom   Objective:  There were no vitals filed for this visit. There is no height or weight on file to calculate BMI.  Physical Exam  Constitutional: He is oriented to person, place, and time and well-developed, well-nourished, and in no distress.  HENT:  Head: Normocephalic.  Mouth/Throat: Oropharynx is clear and moist.  Eyes: No scleral icterus.  Pulmonary/Chest: Effort normal.  Neurological: He is alert and oriented to person, place, and time.  Psychiatric: Mood, memory and affect normal.    Lab Results Lab  Results  Component Value Date   WBC 5.4 08/31/2019   HGB 14.5 08/31/2019   HCT 42.4 08/31/2019   MCV 94.0 08/31/2019   PLT 278 08/31/2019    Lab Results  Component Value Date   CREATININE 0.96 08/31/2019   BUN 16 08/31/2019   NA 138 08/31/2019   K 4.4 08/31/2019   CL 101 08/31/2019   CO2 30 08/31/2019    Lab Results  Component Value Date   ALT 29 08/31/2019   AST 20 08/31/2019   ALKPHOS 70 05/29/2016   BILITOT 0.4 08/31/2019    Lab Results  Component Value Date   CHOL 155 08/31/2019   HDL 36 (L) 08/31/2019   LDLCALC 94 08/31/2019   TRIG 148 08/31/2019   CHOLHDL 4.3 08/31/2019   HIV 1 RNA Quant (copies/mL)  Date Value  08/31/2019 <20 DETECTED (A)  12/22/2018 56 (H)  09/08/2018 <20 DETECTED (A)   CD4 T Cell Abs (/uL)  Date Value  08/31/2019 844  12/22/2018 1,260  09/09/2018 1,470   Lab Results  Component Value Date   HAV NEG 02/24/2010   Lab Results  Component Value Date   HEPBSAG NEG 02/24/2010   HEPBSAB INDETER (A) 02/24/2010   Lab Results  Component Value Date   HCVAB NEGATIVE 09/14/2014   Lab Results  Component Value Date   CHLAMYDIAWP Negative 04/09/2019   CHLAMYDIAWP Negative 04/09/2019   N Negative 04/09/2019   N Negative 04/09/2019     Problem List Items Addressed This Visit      Unprioritized   Hyperlipidemia    Stable on current dose of crestor 10 mg QD. LDL goal as outlined per Dr. Rennis GoldenHilty < 100. Continue with Q5926m monitoring      Human immunodeficiency virus (HIV) disease (HCC)    Excellent long term control with recent VL < 20 and CD4 in healthy range. Will continue Triumeq for him. He will return in 6 months for labs and another EVisit if he would like. Will check Urine STI screen per routine at this visit.  Appt made next week for immunization update.       Depression, major (HCC)    Improved with removal of situational stressors. Managed by psychiatrist.         Rexene AlbertsStephanie Aubriee Szeto, MSN, NP-C [facility]  Pager: [phone]  09/23/2019  5:26 PM

## 2019-09-23 NOTE — Assessment & Plan Note (Signed)
Improved with removal of situational stressors. Managed by psychiatrist.

## 2019-09-23 NOTE — Telephone Encounter (Signed)
Called Patient back to advise the information about the PA given from nurse

## 2019-09-23 NOTE — Telephone Encounter (Signed)
Left patient a message a few weeks ago that it probably needs a prior authorization due to not being on the formulary perhaps. Will try to submit but without it being the start of 2021 it may be too soon to try.

## 2019-09-23 NOTE — Telephone Encounter (Signed)
Pt called to report he has received 2 letters from [employer] stating Viibryd will not be covered eff 10/16/19. He is currently taking Viibryd 40 mg 1/d. Please advise patient in the matter ASAP @ [phone]. NEXT APPT 1/8

## 2019-09-24 ENCOUNTER — Other Ambulatory Visit: Payer: Self-pay

## 2019-09-24 ENCOUNTER — Ambulatory Visit (INDEPENDENT_AMBULATORY_CARE_PROVIDER_SITE_OTHER): Payer: BC Managed Care – PPO | Admitting: Psychiatry

## 2019-09-24 ENCOUNTER — Encounter: Payer: Self-pay | Admitting: Psychiatry

## 2019-09-24 DIAGNOSIS — F331 Major depressive disorder, recurrent, moderate: Secondary | ICD-10-CM

## 2019-09-24 DIAGNOSIS — B20 Human immunodeficiency virus [HIV] disease: Secondary | ICD-10-CM

## 2019-09-24 NOTE — Progress Notes (Signed)
      [facility] Counselor/Therapist Progress Note  Patient ID: Frank Webster, MRN: [id number],    Date: 09/24/2019  Time Spent: 57 minutes   Treatment Type: Individual Therapy  Reported Symptoms: anxious, sad, hopeless.  Mental Status Exam:  Appearance:   Well Groomed     Behavior:  Appropriate  Motor:  Normal  Speech/Language:   Clear and Coherent  Affect:  Appropriate  Mood:  anxious and sad  Thought process:  normal  Thought content:    WNL  Sensory/Perceptual disturbances:    WNL  Orientation:  oriented to person, place, time/date and situation  Attention:  Good  Concentration:  Good  Memory:  WNL  Fund of knowledge:   Good  Insight:    Good  Judgment:   Good  Impulse Control:  Good   Risk Assessment: Danger to Self:  No Self-injurious Behavior: No Danger to Others: No Duty to Warn:no Physical Aggression / Violence:No  Access to Firearms a concern: No  Gang Involvement:No   Subjective: The client states that his ex-boyfriend is still detoxing from benzodiazepines.  After our last session he had planned on blocking the boyfriend's number but did not.  He was contacted again and things went downhill from there.  The client ultimately did block his phone number.  Today he was distressed that this young man who needs help is doing so poorly. The client works in the recovery community.  He has met people that have shot heroin for 40 and 50 years.  "Will this be him?"  We discussed at length the necessary boundaries that the client needs to move forward.  He did realize that his empathy and compassion were not dysfunctional but needed to be directed correctly. I used the bilateral stimulation hand paddles with the client as he discussed this very distressing subject.  He was able to reduce his subjective units of distress from a 7 to less than 2 by the end of the session.  He was also able to identify 2 goals.  1) the fear of a lack of relationship.  2) I need to manage  my expectations of relationships. At the next session the client would like to do EMDR around his fear of public speaking.  Interventions: Assertiveness/Communication, Motivational Interviewing, Solution-Oriented/Positive Psychology and Insight-Oriented  Diagnosis:   ICD-10-CM   1. Major depressive disorder, recurrent episode, moderate (HCC)  F33.1     Plan: Assertiveness, boundaries, mood independent behavior, radical acceptance, self-care.  Shayna Eblen, [facility]

## 2019-09-28 ENCOUNTER — Other Ambulatory Visit: Payer: Self-pay

## 2019-09-28 ENCOUNTER — Ambulatory Visit (INDEPENDENT_AMBULATORY_CARE_PROVIDER_SITE_OTHER): Payer: BC Managed Care – PPO

## 2019-09-28 DIAGNOSIS — Z23 Encounter for immunization: Secondary | ICD-10-CM

## 2019-09-28 NOTE — Progress Notes (Signed)
Flu Vaccine Given.   Laverle Patter, RN

## 2019-10-13 ENCOUNTER — Ambulatory Visit: Payer: BC Managed Care – PPO | Admitting: Psychiatry

## 2019-10-14 ENCOUNTER — Other Ambulatory Visit: Payer: Self-pay

## 2019-10-14 ENCOUNTER — Encounter: Payer: Self-pay | Admitting: Psychiatry

## 2019-10-14 ENCOUNTER — Ambulatory Visit (INDEPENDENT_AMBULATORY_CARE_PROVIDER_SITE_OTHER): Payer: BC Managed Care – PPO | Admitting: Psychiatry

## 2019-10-14 DIAGNOSIS — F331 Major depressive disorder, recurrent, moderate: Secondary | ICD-10-CM | POA: Diagnosis not present

## 2019-10-14 NOTE — Progress Notes (Signed)
      [facility] Counselor/Therapist Progress Note  Patient ID: Drury Ardizzone, MRN: [id number],    Date: 10/14/2019  Time Spent: 57 minutes   Treatment Type: Individual Therapy  Reported Symptoms: anxiety, sad  Mental Status Exam:  Appearance:   Casual     Behavior:  Appropriate  Motor:  Normal  Speech/Language:   Clear and Coherent  Affect:  Appropriate  Mood:  anxious and sad  Thought process:  normal  Thought content:    WNL  Sensory/Perceptual disturbances:    WNL  Orientation:  oriented to person, place, time/date and situation  Attention:  Good  Concentration:  Good  Memory:  WNL  Fund of knowledge:   Good  Insight:    Good  Judgment:   Good  Impulse Control:  Good   Risk Assessment: Danger to Self:  No Self-injurious Behavior: No Danger to Others: No Duty to Warn:no Physical Aggression / Violence:No  Access to Firearms a concern: No  Gang Involvement:No   Subjective: The client states that, "my dog is my world now."  He stayed home over Christmas from both his parents and his sister due to COVID-19.  He recently reconnected with his ex-boyfriend who had moved to [place].  The client had  unblocked him because he was not in the state.  Apparently he is benzodiazepine free.  The client felt relief at knowing he was okay. Client states a friend of his came over worried about him.  He notes that he has been sad and depressed.  His main issue has to do with his finances.  He has a large amount of debt and a limited income.  We discussed financial options that he has.  I also used eye-movement with the client focusing on his fear of his debt.  His subjective units of distress is a 9+.  As he processed I pointed out that he was responding to how bad his finances were when he was using.  Now the debt he has is for specific things such as the remodeling of his condominium.  I suggested that if he just worked his debt over time he could get out of it.  I also suggested  that he apply some radical acceptance to the circumstances since they were not going to change.  He agreed.  His subjective units of distress dropped to less than 2.  His positive cognition was, "I can manage this."  Interventions: Assertiveness/Communication, Motivational Interviewing, Solution-Oriented/Positive Psychology, Eye Movement Desensitization and Reprocessing (EMDR) and Insight-Oriented  Diagnosis:   ICD-10-CM   1. Major depressive disorder, recurrent episode, moderate (HCC)  F33.1     Plan: Radical acceptance, self-care, financial plan, assertiveness, boundaries, search for part-time job.  Mavery Milling, [facility]

## 2019-10-19 ENCOUNTER — Telehealth: Payer: Self-pay

## 2019-10-19 NOTE — Telephone Encounter (Signed)
Prior authorization submitted and approved for Viibryd 40 mg effective 10/19/2019-10/18/2020 through [facility].

## 2019-10-20 ENCOUNTER — Ambulatory Visit: Payer: BC Managed Care – PPO | Admitting: Physician Assistant

## 2019-10-23 ENCOUNTER — Ambulatory Visit (INDEPENDENT_AMBULATORY_CARE_PROVIDER_SITE_OTHER): Payer: BC Managed Care – PPO | Admitting: Psychiatry

## 2019-10-23 ENCOUNTER — Encounter: Payer: Self-pay | Admitting: Psychiatry

## 2019-10-23 DIAGNOSIS — F39 Unspecified mood [affective] disorder: Secondary | ICD-10-CM | POA: Diagnosis not present

## 2019-10-23 DIAGNOSIS — F4310 Post-traumatic stress disorder, unspecified: Secondary | ICD-10-CM

## 2019-10-23 MED ORDER — LAMOTRIGINE 200 MG PO TABS: 200.0000 mg | ORAL_TABLET | Freq: Every day | ORAL | 2 refills | Status: DC

## 2019-10-23 MED ORDER — BUSPIRONE HCL 30 MG PO TABS: 15.0000 mg | ORAL_TABLET | Freq: Two times a day (BID) | ORAL | 0 refills | Status: DC

## 2019-10-23 NOTE — Progress Notes (Signed)
Frank Webster [id number] 05-Jun-1976 44 y.o.  Virtual Visit via Telephone Note  I connected with pt on 10/23/19 at 11:30 AM EST by telephone and verified that I am speaking with the correct person using two identifiers.   I discussed the limitations, risks, security and privacy concerns of performing an evaluation and management service by telephone and the availability of in person appointments. I also discussed with the patient that there may be a patient responsible charge related to this service. The patient expressed understanding and agreed to proceed.   I discussed the assessment and treatment plan with the patient. The patient was provided an opportunity to ask questions and all were answered. The patient agreed with the plan and demonstrated an understanding of the instructions.   The patient was advised to call back or seek an in-person evaluation if the symptoms worsen or if the condition fails to improve as anticipated.  I provided 30 minutes of non-face-to-face time during this encounter.  The patient was located at home.  The provider was located at [facility].   Corie Chiquito, PMHNP   Subjective:   Patient ID:  Frank Webster is a 44 y.o. (DOB 1976-10-02) male.  Chief Complaint:  Chief Complaint  Patient presents with  . Fatigue  . Follow-up    History of depression, anxiety, and sleep disturbance    HPI Frank Webster presents for follow-up of mood and anxiety. He reports that he has been doing ok. "I don't feel actively depressed." Denies affective dulling. He reports excessive somnolence and low energy. Motivation has been low. Puppy recently had surgery and has staples from the surgery and is needing more of his attention and is waking him up during the night. Puppy is not able to go to doggie daycare and he has not been able to return to sleep more as he did when dog was going to daycare. Reports sleep is fragmented and taking multiple naps  during the day. Denies any difficulty falling asleep. Diminished interest in things. Denies anhedonia and reports that he is able to enjoy things once he engages in something. He reports anxiety in response to his puppy recovering from surgery and having multiple needs. He reports that he has not been feeling as overwhelmed. Reports that he stopped talking with former partner 12/1 and this has been helpful for pt's anxiety. Appetite has been fine. Concentration has been adequate. Denies SI.  Denies impulsive or risky behavior.   Has been more isolated recently.   Past medication trials: Prozac-vomiting Celexa Cymbalta Viibryd BuSpar- Has been helpful for anxiety Wellbutrin Lamictal Trileptal Trazodone Doxazosin Clonidine Trazodone-excessive somnolence, vivid dreams Doxepin-ineffective for insomnia Belsomra- adverse reaction Lunesta- Helped for about 2 nights and then no longer effective. Klonopin- Effective for insomnia. Hydroxyzine- ineffective. Melatonin-Some benefit but does not sleep throughout the night. Gabapentin  Review of Systems:  Review of Systems  Gastrointestinal: Negative.   Musculoskeletal: Negative for gait problem.  Neurological: Negative for tremors.  Psychiatric/Behavioral:       Please refer to HPI    Medications: I have reviewed the patient's current medications.  Current Outpatient Medications  Medication Sig Dispense Refill  . busPIRone (BUSPAR) 30 MG tablet Take 0.5 tablets (15 mg total) by mouth 2 (two) times daily. 90 tablet 0  . cetirizine (ZYRTEC) 10 MG tablet Take 10 mg by mouth daily.    . clonazePAM (KLONOPIN) 0.5 MG tablet Take 0.25-0.5 mg by mouth at bedtime as needed.     . fluticasone (FLONASE) 50  MCG/ACT nasal spray Place 2 sprays into both nostrils daily. (Patient taking differently: Place 2 sprays into both nostrils as needed for allergies. ) 16 g 0  . ibuprofen (ADVIL) 600 MG tablet Take 1 tablet (600 mg total) by mouth every 6  (six) hours as needed for moderate pain. 30 tablet 0  . Multiple Vitamins-Minerals (CENTRUM MEN PO) Take by mouth.    . Probiotic Product (PROBIOTIC PO) Take by mouth.    . rosuvastatin (CRESTOR) 10 MG tablet TAKE 1 TABLET(10 MG) BY MOUTH DAILY (Patient taking differently: Take 10 mg by mouth daily. ) 30 tablet 5  . Vilazodone HCl (VIIBRYD) 40 MG TABS Take 1 tablet (40 mg total) by mouth daily. 90 tablet 0  . HYDROcodone-acetaminophen (NORCO) 5-325 MG tablet Take 1 tablet by mouth every 6 (six) hours as needed. (Patient not taking: Reported on 09/23/2019) 12 tablet 0  . lamoTRIgine (LAMICTAL) 200 MG tablet Take 1 tablet (200 mg total) by mouth daily. 30 tablet 2  . sildenafil (VIAGRA) 25 MG tablet Take 2-4 tablets (50-100 mg total) by mouth daily as needed for erectile dysfunction. (Patient not taking: Reported on 09/23/2019) 10 tablet 2  . tamsulosin (FLOMAX) 0.4 MG CAPS capsule Take 1 capsule (0.4 mg total) by mouth daily. (Patient not taking: Reported on 09/23/2019) 15 capsule 0  . TRIUMEQ 600-50-300 MG tablet TAKE 1 TABLET BY MOUTH ONCE DAILY IN THE MORNING WITH OR WITHOUT FOOD.STORE IN ORIGINAL CONTAINER AT ROOM TEMPERATURE. (Patient taking differently: Take 1 tablet by mouth daily. ) 30 tablet 5   No current facility-administered medications for this visit.    Medication Side Effects: Fatigue  Allergies: No Known Allergies  Past Medical History:  Diagnosis Date  . Anxiety state 04/05/2016  . CANDIDIASIS, ORAL 10/11/2010   Qualifier: Diagnosis of  By: Magdalen Spatz NP, Romona Curls    . Decreased libido 04/05/2016  . Depression, major ([place]) 10/31/2010   Qualifier: Diagnosis of  By: Magdalen Spatz NP, Romona Curls    . Dermatophytosis of unspecified site 10/24/2010   Qualifier: Diagnosis of  By: Magdalen Spatz NP, Romona Curls    . Erectile dysfunction 09/26/2017  . FOLLICULITIS 8/93/8101   Qualifier: Diagnosis of  By: Magdalen Spatz NP, Romona Curls    . Glossitis 10/03/2010   Qualifier: Diagnosis of  By: Magdalen Spatz  NP, Romona Curls    . Hemorrhoid 09/29/2012  . HIV infection ([place])   . Inflamed seborrheic keratosis 10/11/2010   Qualifier: Diagnosis of  By: Magdalen Spatz NP, Romona Curls    . Left shoulder pain 03/17/2015  . Nonallopathic lesion of cervical region 04/14/2015  . Nonallopathic lesion of lumbosacral region 04/14/2015  . Nonallopathic lesion of thoracic region 04/14/2015  . Other specified disorder of rectum and anus 10/03/2010   Qualifier: Diagnosis of  By: Magdalen Spatz NP, Romona Curls    . OTITIS EXTERNA 06/14/2010   Qualifier: Diagnosis of  By: Tomma Lightning MD, Claiborne Billings    . Pruritus ani 10/24/2010   Qualifier: Diagnosis of  By: Magdalen Spatz NP, Romona Curls    . Racing heart beat 04/21/2015  . Scapular dyskinesis 03/24/2015  . Seasonal allergies 01/27/2014  . Sprain of acromioclavicular joint 03/17/2015  . STD exposure 09/26/2017  . Substance abuse ([place]) 01/27/2014  . Substance abuse in remission ([place])   . Testicular/scrotal pain 04/21/2015  . Tobacco abuse 03/22/2015  . URI 10/31/2010   Qualifier: Diagnosis of  By: Magdalen Spatz NP, Romona Curls    . 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 Cancer Maternal Grandmother   . Tremor Father   . Eating disorder Sister   . Depression Sister   . Tremor Paternal Uncle   . Tremor Paternal Grandfather   . Eating disorder Sister   . Eating disorder Sister   . Arthritis Neg Hx   . Lupus Neg Hx   . Rosacea Neg Hx     Social History   Socioeconomic History  . Marital status: Single    Spouse name: Not on file  . Number of children: 0  . Years of education: 93  . Highest education level: Not on file  Occupational History  . Occupation: Server  Tobacco Use  . Smoking status: Former Smoker    Packs/day: 0.75    Years: 25.00    Pack years: 18.75    Types: Cigarettes    Quit date: 03/04/2016    Years since quitting: 3.6  . Smokeless tobacco: Never Used  Substance and Sexual  Activity  . 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 Sexual activity: Yes    Partners: Male    Birth control/protection: Condom  Other Topics Concern  . Not on file  Social History Narrative   Fun: Yoga, hiking, going to the gym.    Social Determinants of Health   Financial Resource Strain:   . Difficulty of Paying Living Expenses: Not on file  Food Insecurity:   . Worried About Programme researcher, broadcasting/film/video in the Last Year: Not on file  . Ran Out of Food in the Last Year: Not on file  Transportation Needs:   . Lack of Transportation (Medical): Not on file  . Lack of Transportation (Non-Medical): Not on file  Physical Activity:   . Days of Exercise per Week: Not on file  . Minutes of Exercise per Session: Not on file  Stress:   . Feeling of Stress : Not on file  Social Connections:   . Frequency of Communication with Friends and Family: Not on file  . Frequency of Social Gatherings with Friends and Family: Not on file  . Attends Religious Services: Not on file  . Active Member of Clubs or Organizations: Not on file  . Attends Banker Meetings: Not on file  . Marital Status: Not on file  Intimate Partner Violence:   . Fear of Current or Ex-Partner: Not on file  . Emotionally Abused: Not on file  . Physically Abused: Not on file  . Sexually Abused: Not on file    Past Medical History, Surgical history, Social history, and Family history were reviewed and updated as appropriate.   Please see review of systems for further details on the patient's review from today.   Objective:   Physical Exam:  There were no vitals taken for this visit.  Physical Exam Neurological:     Mental Status: He is alert and oriented to person, place, and time.     Cranial Nerves: No dysarthria.  Psychiatric:        Attention and Perception: Attention and perception normal.        Speech: Speech normal.         Behavior: Behavior is cooperative.        Thought Content: Thought content normal. Thought content is not paranoid or delusional. Thought content does not include homicidal or suicidal ideation. Thought  content does not include homicidal or suicidal plan.        Cognition and Memory: Cognition and memory normal.        Judgment: Judgment normal.     Comments: Insight intact Mood presents as anxious when talking about caring for puppy after surgery.  Mood presents as less depressed compared to last exam     Lab Review:     Component Value Date/Time   NA 138 08/31/2019 1436   K 4.4 08/31/2019 1436   CL 101 08/31/2019 1436   CO2 30 08/31/2019 1436   GLUCOSE 90 08/31/2019 1436   GLUCOSE 76 02/28/2009 0000   BUN 16 08/31/2019 1436   CREATININE 0.96 08/31/2019 1436   CALCIUM 9.9 08/31/2019 1436   PROT 7.0 08/31/2019 1436   ALBUMIN 4.6 05/29/2016 1441   AST 20 08/31/2019 1436   ALT 29 08/31/2019 1436   ALKPHOS 70 05/29/2016 1441   BILITOT 0.4 08/31/2019 1436   GFRNONAA 95 08/31/2019 1436   GFRAA 110 08/31/2019 1436       Component Value Date/Time   WBC 5.4 08/31/2019 1436   RBC 4.51 08/31/2019 1436   HGB 14.5 08/31/2019 1436   HCT 42.4 08/31/2019 1436   PLT 278 08/31/2019 1436   MCV 94.0 08/31/2019 1436   MCH 32.2 08/31/2019 1436   MCHC 34.2 08/31/2019 1436   RDW 13.2 08/31/2019 1436   LYMPHSABS 1,658 08/31/2019 1436   MONOABS 360 05/29/2016 1441   EOSABS 38 08/31/2019 1436   BASOSABS 11 08/31/2019 1436    No results found for: POCLITH, LITHIUM   No results found for: PHENYTOIN, PHENOBARB, VALPROATE, CBMZ   .res Assessment: Plan:   Discussed attempting gradual dose reductions of medications to potentially eliminate any necessary medications and in the event that current medications are contributing to his fatigue.  Will therefore decrease BuSpar to 15 mg twice daily and reevaluate in 1 month, and then consider possible discontinuation if appropriate. Continue  Viibryd 40 mg daily for depression and anxiety. Continue Lamictal for mood signs and symptoms. Recommend continuing psychotherapy with Sherron Monday, LPC. Patient to follow-up with this provider in 4 weeks or sooner if clinically indicated. Patient advised to contact office with any questions, adverse effects, or acute worsening in signs and symptoms.  Frank Webster was seen today for fatigue and follow-up.  Diagnoses and all orders for this visit:  PTSD (post-traumatic stress disorder) -     busPIRone (BUSPAR) 30 MG tablet; Take 0.5 tablets (15 mg total) by mouth 2 (two) times daily.  Mood disorder (HCC) -     lamoTRIgine (LAMICTAL) 200 MG tablet; Take 1 tablet (200 mg total) by mouth daily.    Please see After Visit Summary for patient specific instructions.  Future Appointments  Date Time Provider Department Center  11/09/2019 12:00 PM May, [place], [facility] None  11/20/2019 10:30 AM Corie Chiquito, PMHNP [facility] None  12/08/2019 12:00 PM May, [place], [facility] None  03/10/2020  2:00 PM [facility]  03/23/2020  2:45 PM Blanchard Kelch, NP [facility]    No orders of the defined types were placed in this encounter.     -------------------------------

## 2019-11-09 ENCOUNTER — Ambulatory Visit (INDEPENDENT_AMBULATORY_CARE_PROVIDER_SITE_OTHER): Payer: BC Managed Care – PPO | Admitting: Psychiatry

## 2019-11-09 ENCOUNTER — Other Ambulatory Visit: Payer: Self-pay | Admitting: Infectious Diseases

## 2019-11-09 ENCOUNTER — Other Ambulatory Visit: Payer: Self-pay

## 2019-11-09 ENCOUNTER — Encounter: Payer: Self-pay | Admitting: Psychiatry

## 2019-11-09 DIAGNOSIS — F331 Major depressive disorder, recurrent, moderate: Secondary | ICD-10-CM | POA: Diagnosis not present

## 2019-11-09 DIAGNOSIS — E785 Hyperlipidemia, unspecified: Secondary | ICD-10-CM

## 2019-11-09 NOTE — Progress Notes (Signed)
      [facility] Counselor/Therapist Progress Note  Patient ID: Frank Webster, MRN: [id number],    Date: 11/09/2019  Time Spent: 50 minutes   Treatment Type: Individual Therapy  Reported Symptoms: anxiety, stress, sad  Mental Status Exam:  Appearance:   Casual     Behavior:  Appropriate  Motor:  Normal  Speech/Language:   Clear and Coherent  Affect:  Appropriate  Mood:  anxious and sad  Thought process:  normal  Thought content:    WNL  Sensory/Perceptual disturbances:    WNL  Orientation:  oriented to person, place, time/date and situation  Attention:  Good  Concentration:  Good  Memory:  WNL  Fund of knowledge:   Good  Insight:    Good  Judgment:   Good  Impulse Control:  Good   Risk Assessment: Danger to Self:  No Self-injurious Behavior: No Danger to Others: No Duty to Warn:no Physical Aggression / Violence:No  Access to Firearms a concern: No  Gang Involvement:No   Subjective: The client states he has a friend staying with him temporarily.  So far it is going well.  He did describe an event where he got his feelings hurt with this friend.  He felt like he was challenged on what he was saying.  His negative cognition was, "I am a horrible person."  He feels anxiety and sadness in his chest and stomach.  His subjective units of distress is a 5.  We used eye-movement on this.  As the client processed he remembered his birthday as a child getting the [employer] action figures.  This was not what he wanted but thought if he did not accept them that he was a horrible person.  He did say that he has a desire to be right so being challenged effects that.  We discussed not being defined by others.  We also discussed personality style and that his reactivity comes from his strong intuitive feeling part of his personality.  We talked about ways of managing that.  I gave the client a handout on thinking errors as well as mindfulness practices.  I asked him to be aware of not  interpreting or mind reading.  I also explained that mindfulness can be a good way to control his thoughts.  I also suggested 30 minutes of cardio 3 times a week to help reduce the stress chemicals in his body and get him more centered.  I also explained that it was a good way to get another form of bilateral stimulation.  The client agreed that he would work on these.  I explained to the client that he would need to use mood independent behavior to get these things going.  His positive cognition at the end of the session was, "I am just human."  Interventions: Assertiveness/Communication, Mindfulness Meditation, Solution-Oriented/Positive Psychology, Psycho-education/Bibliotherapy, Eye Movement Desensitization and Reprocessing (EMDR) and Insight-Oriented  Diagnosis:   ICD-10-CM   1. Major depressive disorder, recurrent episode, moderate (HCC)  F33.1     Plan: Thinking errors, mindfulness skills, mood independent behavior, exercise, self-care, boundaries, assertiveness, positive self talk.  Gelene Mink Aasia Peavler, [facility]

## 2019-11-20 ENCOUNTER — Ambulatory Visit (INDEPENDENT_AMBULATORY_CARE_PROVIDER_SITE_OTHER): Payer: BC Managed Care – PPO | Admitting: Psychiatry

## 2019-11-20 ENCOUNTER — Encounter: Payer: Self-pay | Admitting: Psychiatry

## 2019-11-20 DIAGNOSIS — F4310 Post-traumatic stress disorder, unspecified: Secondary | ICD-10-CM

## 2019-11-20 DIAGNOSIS — F39 Unspecified mood [affective] disorder: Secondary | ICD-10-CM

## 2019-11-20 DIAGNOSIS — F5101 Primary insomnia: Secondary | ICD-10-CM | POA: Diagnosis not present

## 2019-11-20 MED ORDER — VILAZODONE HCL 40 MG PO TABS: 40.0000 mg | ORAL_TABLET | Freq: Every day | ORAL | 0 refills | Status: DC

## 2019-11-20 MED ORDER — BUSPIRONE HCL 30 MG PO TABS: ORAL_TABLET | ORAL | 0 refills | Status: DC

## 2019-11-20 MED ORDER — LAMOTRIGINE 200 MG PO TABS: 200.0000 mg | ORAL_TABLET | Freq: Every day | ORAL | 2 refills | Status: DC

## 2019-11-20 NOTE — Progress Notes (Signed)
Frank Webster [id number] Dec 23, 1975 44 y.o.  Virtual Visit via Telephone Note  I connected with pt on 11/20/19 at 10:30 AM EST by telephone and verified that I am speaking with the correct person using two identifiers.   I discussed the limitations, risks, security and privacy concerns of performing an evaluation and management service by telephone and the availability of in person appointments. I also discussed with the patient that there may be a patient responsible charge related to this service. The patient expressed understanding and agreed to proceed.   I discussed the assessment and treatment plan with the patient. The patient was provided an opportunity to ask questions and all were answered. The patient agreed with the plan and demonstrated an understanding of the instructions.   The patient was advised to call back or seek an in-person evaluation if the symptoms worsen or if the condition fails to improve as anticipated.  I provided 30 minutes of non-face-to-face time during this encounter.  The patient was located at home.  The provider was located at [place].   Thayer Headings, PMHNP   Subjective:   Patient ID:  Frank Webster is a 44 y.o. (DOB 09-Oct-1976) male.  Chief Complaint:  Chief Complaint  Patient presents with  . Follow-up    Medication Management  . Post-Traumatic Stress Disorder    Medication Management    HPI Frank Webster presents for follow-up of mood and anxiety.   He reports that he is doing well overall and has not noticed any worsening s/s. Fatigue seems to have improved some. Continues to take naps during the day.   He has a friend staying with him temporarily and this has helped his mood with having some companionship. "Right now I don't have stress in my life, so I am doing ok." He reports that his mood and anxiety seem to be significantly affected by outside circumstances. Denies depressed mood and reports that anxiety has  been somewhat improved. He reports that his energy and motivation have been slightly low. Concentration has been adequate. Denies change in appetite. Denies impulsive or risky behavior. Denies SI.   Reports that he previously stopped Klonopin since it was not longer as effective and he was feeling groggy upon awakening. Continues to awaken 5-7 times a night. Denies difficulty with sleep initiation.     Past medication trials: Prozac-vomiting Celexa Cymbalta Viibryd BuSpar- Has been helpful for anxiety Wellbutrin Lamictal Trileptal Trazodone Doxazosin Clonidine Trazodone-excessive somnolence, vivid dreams Doxepin-ineffective for insomnia Belsomra- adverse reaction Lunesta- Helped for about 2 nights and then no longer effective. Klonopin- Effective for insomnia. Hydroxyzine- ineffective. Melatonin-Some benefit but does not sleep throughout the night. Gabapentin Klonopin  Review of Systems:  Review of Systems  HENT: Positive for congestion, sinus pressure and sinus pain.   Musculoskeletal: Negative for gait problem.  Neurological: Negative for tremors.  Psychiatric/Behavioral:       Please refer to HPI    Medications: I have reviewed the patient's current medications.  Current Outpatient Medications  Medication Sig Dispense Refill  . busPIRone (BUSPAR) 30 MG tablet Take 1/3 tab po BID x 1 week, then stop. 90 tablet 0  . cetirizine (ZYRTEC) 10 MG tablet Take 10 mg by mouth daily.    . cholecalciferol (VITAMIN D3) 25 MCG (1000 UNIT) tablet Take 2,000 Units by mouth daily.    . fluticasone (FLONASE) 50 MCG/ACT nasal spray Place 2 sprays into both nostrils daily. (Patient taking differently: Place 2 sprays into both nostrils as needed for allergies. )  16 g 0  . ibuprofen (ADVIL) 600 MG tablet Take 1 tablet (600 mg total) by mouth every 6 (six) hours as needed for moderate pain. 30 tablet 0  . lamoTRIgine (LAMICTAL) 200 MG tablet Take 1 tablet (200 mg total) by mouth daily. 30  tablet 2  . Multiple Vitamins-Minerals (CENTRUM MEN PO) Take by mouth.    . Probiotic Product (PROBIOTIC PO) Take by mouth.    . rosuvastatin (CRESTOR) 10 MG tablet TAKE 1 TABLET(10 MG) BY MOUTH DAILY 30 tablet 5  . TRIUMEQ 600-50-300 MG tablet TAKE 1 TABLET BY MOUTH ONCE DAILY IN THE MORNING WITH OR WITHOUT FOOD.STORE IN ORIGINAL CONTAINER AT ROOM TEMPERATURE. (Patient taking differently: Take 1 tablet by mouth daily. ) 30 tablet 5  . Vilazodone HCl (VIIBRYD) 40 MG TABS Take 1 tablet (40 mg total) by mouth daily. 90 tablet 0   No current facility-administered medications for this visit.    Medication Side Effects: None  Allergies: No Known Allergies  Past Medical History:  Diagnosis Date  . Anxiety state 04/05/2016  . CANDIDIASIS, ORAL 10/11/2010   Qualifier: Diagnosis of  By: [name] NP, Malvin Johns    . Decreased libido 04/05/2016  . Depression, major (HCC) 10/31/2010   Qualifier: Diagnosis of  By: [name] NP, Malvin Johns    . Dermatophytosis of unspecified site 10/24/2010   Qualifier: Diagnosis of  By: [name] NP, Malvin Johns    . Erectile dysfunction 09/26/2017  . FOLLICULITIS 10/24/2010   Qualifier: Diagnosis of  By: [name] NP, Malvin Johns    . Glossitis 10/03/2010   Qualifier: Diagnosis of  By: [name] NP, Malvin Johns    . Hemorrhoid 09/29/2012  . HIV infection (HCC)   . Inflamed seborrheic keratosis 10/11/2010   Qualifier: Diagnosis of  By: [name] NP, Malvin Johns    . Left shoulder pain 03/17/2015  . Nonallopathic lesion of cervical region 04/14/2015  . Nonallopathic lesion of lumbosacral region 04/14/2015  . Nonallopathic lesion of thoracic region 04/14/2015  . Other specified disorder of rectum and anus 10/03/2010   Qualifier: Diagnosis of  By: [name] NP, Malvin Johns    . OTITIS EXTERNA 06/14/2010   Qualifier: Diagnosis of  By: Philipp Deputy MD, Tresa Endo    . Pruritus ani 10/24/2010   Qualifier: Diagnosis of  By: [name] NP, Malvin Johns    . Racing heart beat 04/21/2015  . Scapular  dyskinesis 03/24/2015  . Seasonal allergies 01/27/2014  . Sprain of acromioclavicular joint 03/17/2015  . STD exposure 09/26/2017  . Substance abuse (HCC) 01/27/2014  . Substance abuse in remission (HCC)   . Testicular/scrotal pain 04/21/2015  . Tobacco abuse 03/22/2015  . URI 10/31/2010   Qualifier: Diagnosis of  By: [name] NP, Malvin Johns    . 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 Cancer Maternal Grandmother   . Tremor Father   . Eating disorder Sister   . Depression Sister   . Tremor Paternal Uncle   . Tremor Paternal Grandfather   . Eating disorder Sister   . Eating disorder Sister   . Arthritis Neg Hx   . Lupus Neg Hx   . Rosacea Neg Hx     Social History   Socioeconomic History  . Marital status: Single    Spouse name: Not on file  . Number of children: 0  . Years of education: 109  . Highest education level: Not on file  Occupational History  .  Occupation: Server  Tobacco Use  . Smoking status: Former Smoker    Packs/day: 0.75    Years: 25.00    Pack years: 18.75    Types: Cigarettes    Quit date: 03/04/2016    Years since quitting: 3.7  . Smokeless tobacco: Never Used  Substance and Sexual Activity  . 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 Sexual activity: Yes    Partners: Male    Birth control/protection: Condom  Other Topics Concern  . Not on file  Social History Narrative   Fun: Yoga, hiking, going to the gym.    Social Determinants of Health   Financial Resource Strain:   . Difficulty of Paying Living Expenses: Not on file  Food Insecurity:   . Worried About Programme researcher, broadcasting/film/video in the Last Year: Not on file  . Ran Out of Food in the Last Year: Not on file  Transportation Needs:   . Lack of Transportation (Medical): Not on file  . Lack of Transportation (Non-Medical): Not  on file  Physical Activity:   . Days of Exercise per Week: Not on file  . Minutes of Exercise per Session: Not on file  Stress:   . Feeling of Stress : Not on file  Social Connections:   . Frequency of Communication with Friends and Family: Not on file  . Frequency of Social Gatherings with Friends and Family: Not on file  . Attends Religious Services: Not on file  . Active Member of Clubs or Organizations: Not on file  . Attends Banker Meetings: Not on file  . Marital Status: Not on file  Intimate Partner Violence:   . Fear of Current or Ex-Partner: Not on file  . Emotionally Abused: Not on file  . Physically Abused: Not on file  . Sexually Abused: Not on file    Past Medical History, Surgical history, Social history, and Family history were reviewed and updated as appropriate.   Please see review of systems for further details on the patient's review from today.   Objective:   Physical Exam:  There were no vitals taken for this visit.  Physical Exam Neurological:     Mental Status: He is alert and oriented to person, place, and time.     Cranial Nerves: No dysarthria.  Psychiatric:        Attention and Perception: Attention and perception normal.        Mood and Affect: Mood normal.        Speech: Speech normal.        Behavior: Behavior is cooperative.        Thought Content: Thought content normal. Thought content is not paranoid or delusional. Thought content does not include homicidal or suicidal ideation. Thought content does not include homicidal or suicidal plan.        Cognition and Memory: Cognition and memory normal.        Judgment: Judgment normal.     Comments: Insight intact     Lab Review:     Component Value Date/Time   NA 138 08/31/2019 1436   K 4.4 08/31/2019 1436   CL 101 08/31/2019 1436   CO2 30 08/31/2019 1436   GLUCOSE 90 08/31/2019 1436   GLUCOSE 76 02/28/2009 0000   BUN 16 08/31/2019 1436   CREATININE 0.96 08/31/2019 1436    CALCIUM 9.9  08/31/2019 1436   PROT 7.0 08/31/2019 1436   ALBUMIN 4.6 05/29/2016 1441   AST 20 08/31/2019 1436   ALT 29 08/31/2019 1436   ALKPHOS 70 05/29/2016 1441   BILITOT 0.4 08/31/2019 1436   GFRNONAA 95 08/31/2019 1436   GFRAA 110 08/31/2019 1436       Component Value Date/Time   WBC 5.4 08/31/2019 1436   RBC 4.51 08/31/2019 1436   HGB 14.5 08/31/2019 1436   HCT 42.4 08/31/2019 1436   PLT 278 08/31/2019 1436   MCV 94.0 08/31/2019 1436   MCH 32.2 08/31/2019 1436   MCHC 34.2 08/31/2019 1436   RDW 13.2 08/31/2019 1436   LYMPHSABS 1,658 08/31/2019 1436   MONOABS 360 05/29/2016 1441   EOSABS 38 08/31/2019 1436   BASOSABS 11 08/31/2019 1436    No results found for: POCLITH, LITHIUM   No results found for: PHENYTOIN, PHENOBARB, VALPROATE, CBMZ   .res Assessment: Plan:   Discussed continuing to decrease and discontinue Buspar due to not having any worsening s/s with decrease in Buspar and it is unclear if Buspar could be contributing to fatigue and/or sleep disturbance.  Continue Viibryd 40 mg po qd for depression and anxiety.  Continue Lamictal 200 mg po qd for mood s/s. Pt to f/u in 1-2 months or sooner if clinically indicated. Patient advised to contact office with any questions, adverse effects, or acute worsening in signs and symptoms. Recommend continuing psychotherapy with Sherron Monday, LPC.  Lennell was seen today for follow-up and post-traumatic stress disorder.  Diagnoses and all orders for this visit:  PTSD (post-traumatic stress disorder) -     busPIRone (BUSPAR) 30 MG tablet; Take 1/3 tab po BID x 1 week, then stop. -     Vilazodone HCl (VIIBRYD) 40 MG TABS; Take 1 tablet (40 mg total) by mouth daily.  Mood disorder (HCC) -     lamoTRIgine (LAMICTAL) 200 MG tablet; Take 1 tablet (200 mg total) by mouth daily. -     Vilazodone HCl (VIIBRYD) 40 MG TABS; Take 1 tablet (40 mg total) by mouth daily.  Primary insomnia    Please see After Visit Summary for  patient specific instructions.  Future Appointments  Date Time Provider Department Center  12/08/2019 12:00 PM May, [place], [facility] None  01/05/2020 12:00 PM May, [place], [facility] None  03/10/2020  2:00 PM [facility]  03/23/2020  2:45 PM Blanchard Kelch, NP [facility]    No orders of the defined types were placed in this encounter.     -------------------------------

## 2019-12-08 ENCOUNTER — Other Ambulatory Visit: Payer: Self-pay

## 2019-12-08 ENCOUNTER — Encounter: Payer: Self-pay | Admitting: Psychiatry

## 2019-12-08 ENCOUNTER — Ambulatory Visit (INDEPENDENT_AMBULATORY_CARE_PROVIDER_SITE_OTHER): Payer: BC Managed Care – PPO | Admitting: Psychiatry

## 2019-12-08 ENCOUNTER — Telehealth: Payer: Self-pay | Admitting: Psychiatry

## 2019-12-08 DIAGNOSIS — F331 Major depressive disorder, recurrent, moderate: Secondary | ICD-10-CM

## 2019-12-08 NOTE — Telephone Encounter (Signed)
Case staffed with Dr. Jennelle Human.  Decrease Viibryd 20 mg x 1 week, then 10 mg po qd x 1 week, then stop.  Start Trintellix 5 mg x 1 week, then 10 mg

## 2019-12-08 NOTE — Progress Notes (Signed)
      [facility] Counselor/Therapist Progress Note  Patient ID: Frank Webster, MRN: [id number],    Date: 12/08/2019  Time Spent: 50 minutes   Treatment Type: Individual Therapy  Reported Symptoms: anxious, depressed, fatigued.  Mental Status Exam:  Appearance:   Casual     Behavior:  Appropriate  Motor:  Normal  Speech/Language:   Clear and Coherent  Affect:  Appropriate  Mood:  anxious and depressed  Thought process:  normal  Thought content:    WNL  Sensory/Perceptual disturbances:    WNL  Orientation:  oriented to person, place, time/date and situation  Attention:  Good  Concentration:  Good  Memory:  WNL  Fund of knowledge:   Good  Insight:    Good  Judgment:   Good  Impulse Control:  Good   Risk Assessment: Danger to Self:  No Self-injurious Behavior: No Danger to Others: No Duty to Warn:no Physical Aggression / Violence:No  Access to Firearms a concern: No  Gang Involvement:No   Subjective: The client comes in today fatigued and exhausted.  He has not been sleeping well from phone calls he keeps getting from his ex-boyfriend.  The boyfriend is in [place] and calls every day in a crisis.  "I de-escalate him and then he calls again.  He will then send me a text, "I cannot be a relationship with you"."  When the client states he needs to let him go, the response is that the boyfriend is feeling abandoned.  Today I used eye-movement with the client focusing on the ex-boyfriend.  His negative cognition is, "I am responsible."  He feels sadness in his chest and anxiety.  His subjective units of distress is an 8.  As the client processed he noted how sad, desperate and scared this young man was.  We talked at length about the recovery process and the things that he went through that helped him get to a state of sobriety.  The client understands that this needs to happen for this young man as well.  Although the client blocked him he still felt guilty.  I discussed that he  would have to practice more mood independent behavior and radical acceptance at where things are.  I quoted the old AA adage, "people change not because they see the light but because they feel the heat."  The client agreed and will continue to set those boundaries.  I also encouraged him when our session was done to go home and sleep.  Interventions: Assertiveness/Communication, Motivational Interviewing, Solution-Oriented/Positive Psychology, [employer] Desensitization and Reprocessing (EMDR) and Insight-Oriented  Diagnosis:   ICD-10-CM   1. Major depressive disorder, recurrent episode, moderate (HCC)  F33.1     Plan: Mood independent behavior, positive self talk, assertiveness, boundaries, radical acceptance.  Gelene Mink Amauris Debois, [facility]

## 2019-12-08 NOTE — Telephone Encounter (Signed)
Pt states that insurance has changed their covering of Viibryd and a 90 day Rx cost $800, even with the PA. Two alternatives listed from pharmacy on plan are Nefasodone, and Trintellix. Please call to discuss switching.

## 2019-12-10 ENCOUNTER — Telehealth: Payer: Self-pay | Admitting: Psychiatry

## 2019-12-10 NOTE — Telephone Encounter (Signed)
Discussed options with pt since he reports that Viibryd is now cost prohibitive.   - Will pursue tier exception for Viibryd since pt has good control of s/s without tolerability issues on Viibryd and has had adverse reactions with multiple other medications in the past.   -Discussed considering Trintellix if Viibryd remains cost prohibitive.   Pt reports that he has approximately a 2 month supply of Viibryd remaining. Discussed that samples could be provided if needed.

## 2019-12-11 NOTE — Telephone Encounter (Signed)
Tier Reduction was submitted today 12/11/2019 for Viibryd 40 mg through [employer]. Determination should be 5-7 business days.

## 2019-12-11 NOTE — Telephone Encounter (Signed)
I will check and see if this is possible.

## 2019-12-16 NOTE — Telephone Encounter (Signed)
Contacted [facility] to check status of tier reduction, the representative  resubmitted it again today because she didn't see anything on file other then a PA,said there should be a response in 24-72 hours. Will continue to monitor for reply back

## 2019-12-18 NOTE — Telephone Encounter (Signed)
Left patient detailed message with information and to call back with his preference.

## 2019-12-22 ENCOUNTER — Telehealth: Payer: Self-pay | Admitting: Psychiatry

## 2019-12-22 DIAGNOSIS — F4310 Post-traumatic stress disorder, unspecified: Secondary | ICD-10-CM

## 2019-12-22 DIAGNOSIS — F39 Unspecified mood [affective] disorder: Secondary | ICD-10-CM

## 2019-12-22 MED ORDER — VILAZODONE HCL 40 MG PO TABS: 40.0000 mg | ORAL_TABLET | Freq: Every day | ORAL | 1 refills | Status: DC

## 2019-12-22 NOTE — Telephone Encounter (Signed)
Pt calling in about a message he got last week. He would like to move forward with Viibyrd. Please send to [facility] on [address].

## 2019-12-28 ENCOUNTER — Other Ambulatory Visit: Payer: Self-pay | Admitting: Infectious Diseases

## 2019-12-28 DIAGNOSIS — B20 Human immunodeficiency virus [HIV] disease: Secondary | ICD-10-CM

## 2019-12-30 DIAGNOSIS — N529 Male erectile dysfunction, unspecified: Secondary | ICD-10-CM

## 2020-01-05 ENCOUNTER — Other Ambulatory Visit: Payer: Self-pay

## 2020-01-05 ENCOUNTER — Ambulatory Visit (INDEPENDENT_AMBULATORY_CARE_PROVIDER_SITE_OTHER): Payer: BC Managed Care – PPO | Admitting: Psychiatry

## 2020-01-05 ENCOUNTER — Encounter: Payer: Self-pay | Admitting: Psychiatry

## 2020-01-05 DIAGNOSIS — F331 Major depressive disorder, recurrent, moderate: Secondary | ICD-10-CM

## 2020-01-05 NOTE — Progress Notes (Signed)
[facility] Counselor/Therapist Progress Note  Patient ID: Frank Webster, MRN: [id number],    Date: 01/05/2020  Time Spent: 57 Minutes   Treatment Type: Individual Therapy  Reported Symptoms: loss of interest, depressed  Mental Status Exam:  Appearance:   Casual     Behavior:  Appropriate  Motor:  Normal  Speech/Language:   Clear and Coherent  Affect:  Appropriate  Mood:  depressed  Thought process:  normal  Thought content:    WNL  Sensory/Perceptual disturbances:    WNL  Orientation:  oriented to person, place, time/date and situation  Attention:  Good  Concentration:  Good  Memory:  WNL  Fund of knowledge:   Good  Insight:    Good  Judgment:   Good  Impulse Control:  Good   Risk Assessment: Danger to Self:  No Self-injurious Behavior: No Danger to Others: No Duty to Warn:no Physical Aggression / Violence:No  Access to Firearms a concern: No  Gang Involvement:No   Subjective: "I just want to sleep all day."  The client states that he is not happy here in [place] or happy with his life.  He stated that he wanted to do something to change his mood.  He is considering Suboxone treatment.  He remembers how in rehab that, "the clouds parted".  I asked the client could that would lead him back to relapse?Marland Kitchen  He said it might.  The client feels so desperate to change his mood because he feels so chronically depressed.  He was just notified by his insurance company that the [place] is now a tier 3 drug and he will have to find something else. 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  He has been sober for a number of years and now sponsors other recovering addicts.  He has graduated with his MSW.  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  The client is considering moderated drinking.  This is a program that has an online support meetings.  He thinks that he should be able to drink some wine without it leading  to a relapse.  He is already set up boundaries for himself.  He would only buy a bottle of wine if friends were coming over to have dinner with him.  He would not keep alcohol in his house.  He would not drink liquor.  The client has some conflict with his whole concept.  In NA it would be considered heresy to "control drink".  The client has some other friends who used to be in NA that have now been practicing control drinking successfully. The client also realizes that he is very unhappy living in the city of [place], [place].  He has plans to travel to [place], [place] and stay in an air B&B for 6 days.  He wants to assess the city to see if it is livable for him.  He is looking for a larger gay community that he can be part of.  He feels isolated and alone here in [place].  The client is [place] by ethnicity and has a lot of family in [place].  I encouraged the client to seek out if [place] is going to be the place for him or not.  I also encouraged him to look for good job opportunities and to see if [place] has reciprocity with [employer] in terms of his license. Since the client has tried  a number of antidepressants that have failed I suggested that he talk with Corie Chiquito, PMHNP to discuss the possible use of Spravato.  The client agrees.  Interventions: Motivational Interviewing, Solution-Oriented/Positive Psychology and Insight-Oriented  Diagnosis:   ICD-10-CM   1. Major depressive disorder, recurrent episode, moderate (HCC)  F33.1     Plan: Travel to [place], mood independent behavior, sunlight exposure, comparable market estimate on his condominium, assertiveness, boundaries, self-care, exercise.  [name], [facility]

## 2020-01-08 ENCOUNTER — Telehealth: Payer: Self-pay | Admitting: Psychiatry

## 2020-01-08 DIAGNOSIS — F331 Major depressive disorder, recurrent, moderate: Secondary | ICD-10-CM

## 2020-01-08 MED ORDER — ARIPIPRAZOLE 5 MG PO TABS: 5.0000 mg | ORAL_TABLET | Freq: Every day | ORAL | 0 refills | Status: DC

## 2020-01-08 NOTE — Telephone Encounter (Signed)
Case staffed with Dr. Jennelle Human.   Returned call to pt after he contacted office about changing medication due to low energy and motivation and since Viibryd is no longer covered by his insurance. "I don't have any desire to do anything. All I want to do is sleep." He reports very little joy in his life. Denies SI.   Discussed potential benefits, risks, and side effects of Abilify. Discussed potential metabolic side effects associated with atypical antipsychotics, as well as potential risk for movement side effects. Advised pt to contact office if movement side effects occur.   Will consider switching Viibryd to Sertraline in the future once response to Abilify is known.   F/u in 3 weeks.

## 2020-01-29 IMAGING — MR MR ABDOMEN WO/W CM
11 of 16 series · 26 of 48 positions shown · IV contrast (13 ML MULTIHANCE)
Comparison: Noncontrast CT on 06/25/2019

CLINICAL DATA: Indeterminate liver lesions on recent noncontrast
CT.

EXAM:
MRI ABDOMEN WITHOUT AND WITH CONTRAST
TECHNIQUE: Multiplanar multisequence MR imaging of the abdomen was performed
both before and after the administration of intravenous contrast.
CONTRAST:  13mL MULTIHANCE GADOBENATE DIMEGLUMINE 529 MG/ML IV SOLN

[Series 4: [id number]_trig · axial · 6.5mm · 1.98mm/px · z∈[-113,+114]mm · 3 of 90 slices shown]
[im 1/90]
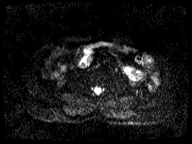
[im 45/90]
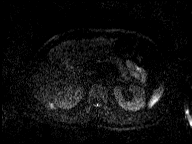
[im 90/90]
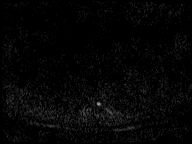

[Series 5: [id number]_trig_adc · axial · 6.5mm · 1.98mm/px · 1 of 30 slices shown]
[im 1/30]
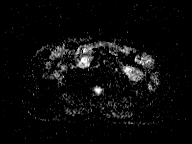

[Series 6: T2 · axial · 6.0mm · 1.12mm/px · 1 of 30 slices shown]
[im 1/30]
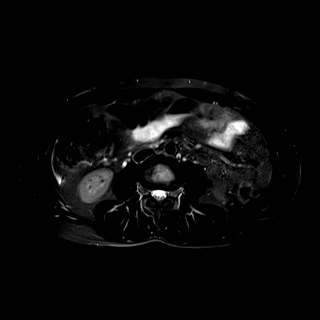

[Series 7: cor haste · coronal · 5.0mm · 0.74mm/px · 1 of 29 slices shown]
[im 1/29]
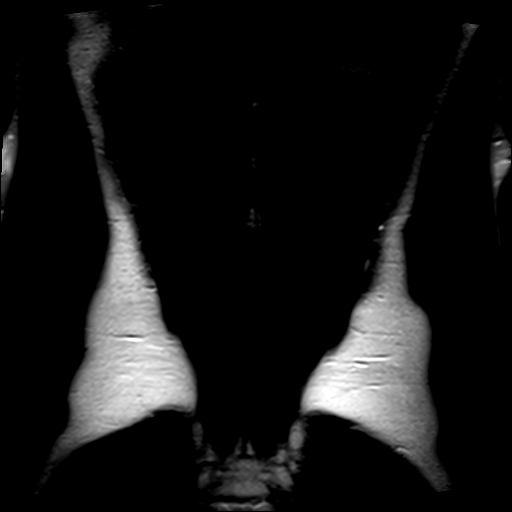

[Series 8: T1 · axial · 6.5mm · 0.74mm/px · z∈[-120,+88]mm · 3 of 60 slices shown]
[im 1/60]
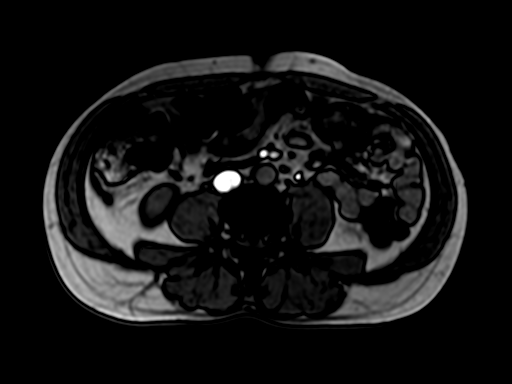
[im 30/60]
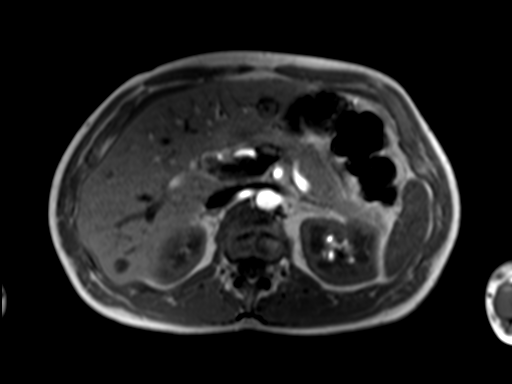
[im 60/60]
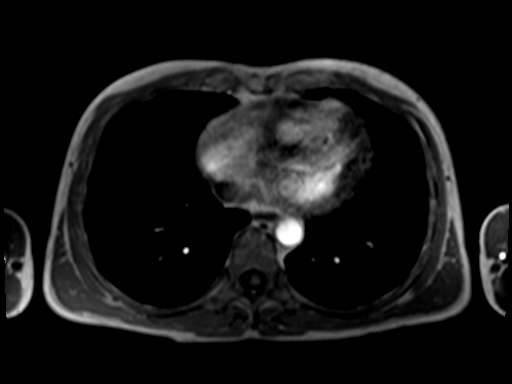

[Series 9: axial haste · axial · 6.5mm · 0.74mm/px · 1 of 30 slices shown]
[im 1/30]
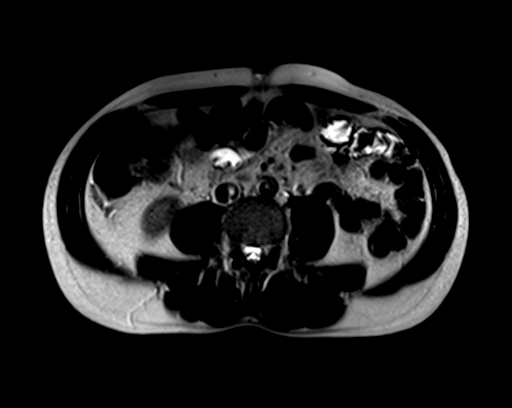

[Series 10: bSSFP · axial · 4.0mm · 0.74mm/px · z∈[-114,+82]mm · 2 of 50 slices shown]
[im 1/50]
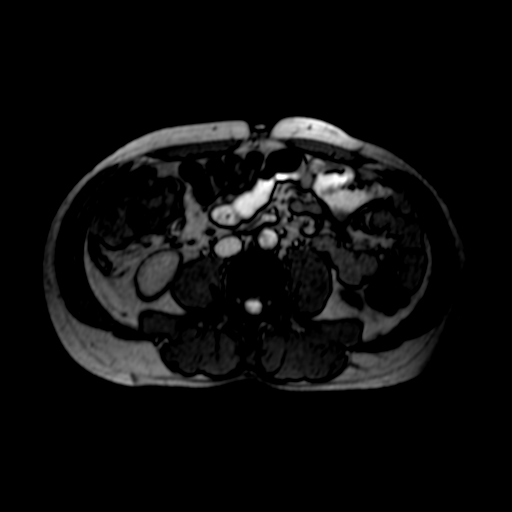
[im 50/50]
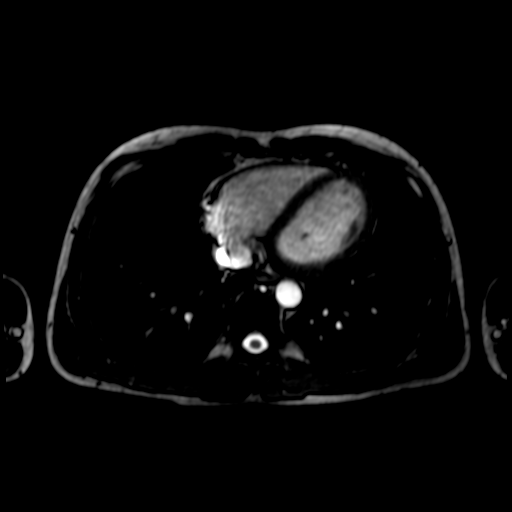

[Series 11: T1 dynamic · axial · non-contrast · 2.7mm · 0.74mm/px · z∈[-123,+91]mm · 4 of 80 slices shown]
[im 1/80]
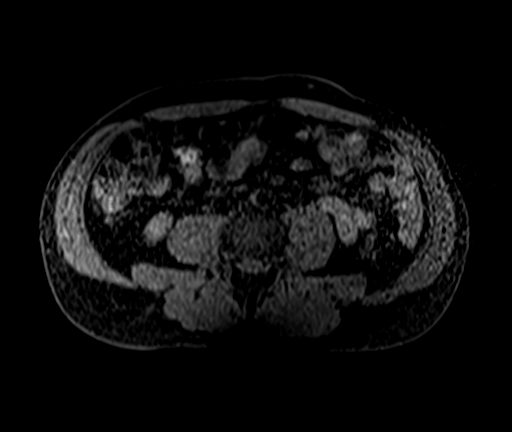
[im 27/80]
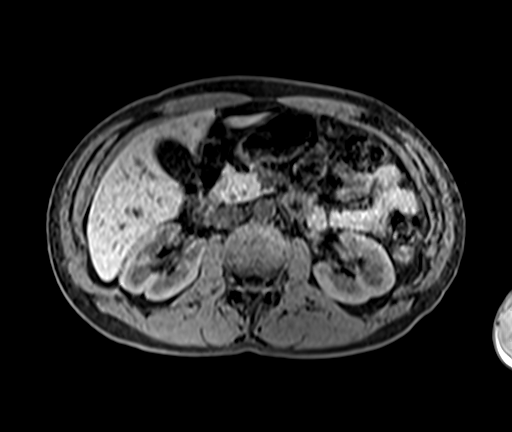
[im 53/80]
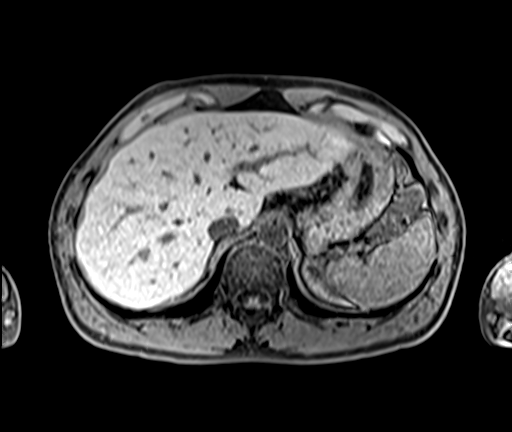
[im 80/80]
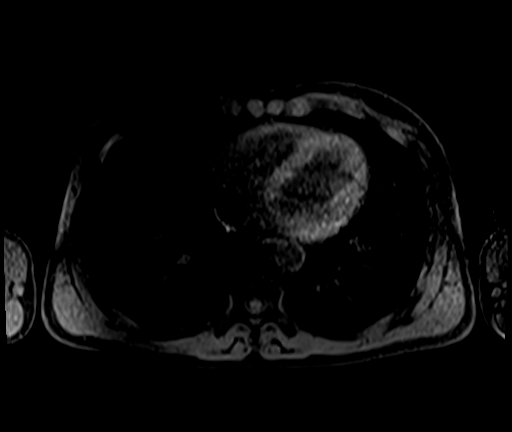

[Series 12: T1 dynamic post-contrast · axial · 2.7mm · 0.74mm/px · z∈[-123,+91]mm · 4 of 80 slices shown (1 of 3)]
[im 1/80]
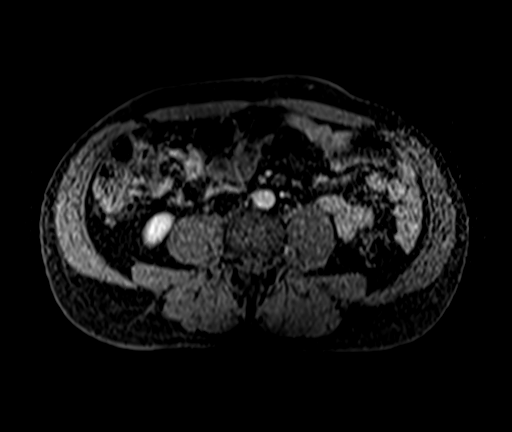
[im 27/80]
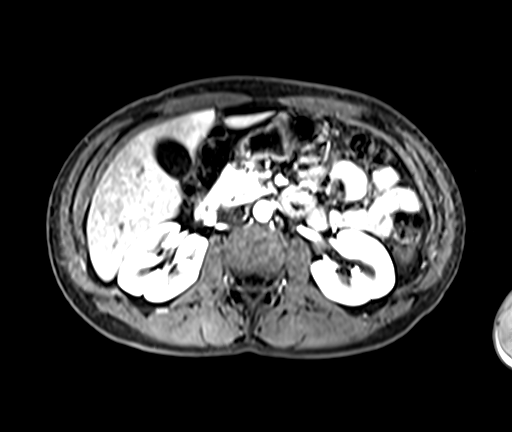
[im 53/80]
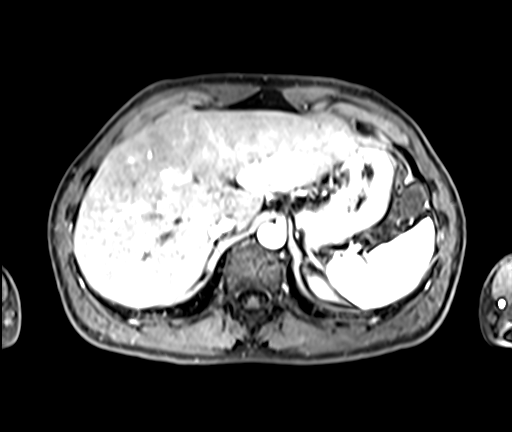
[im 80/80]
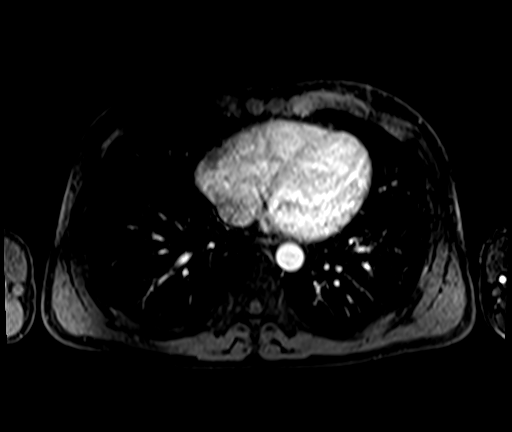

[Series 13: T1 dynamic post-contrast · axial · 2.7mm · 0.74mm/px · z∈[-123,+91]mm · 4 of 80 slices shown (2 of 3)]
[im 1/80]
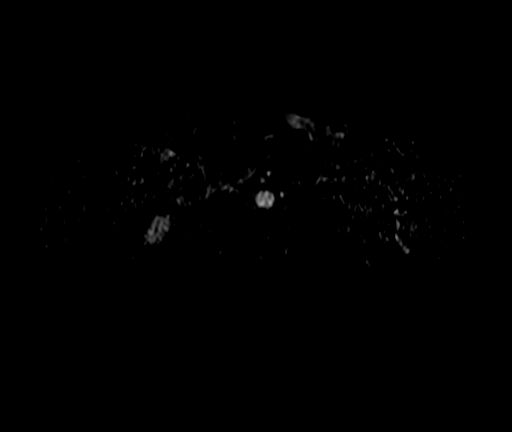
[im 27/80]
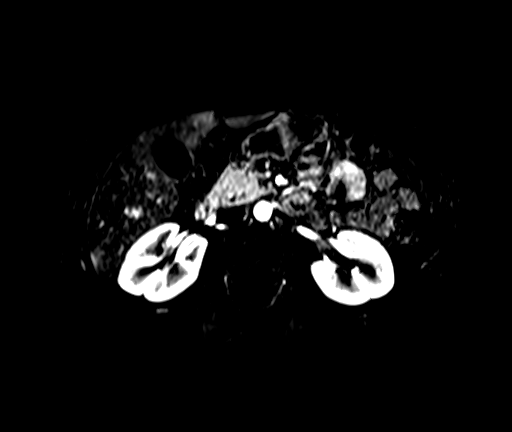
[im 53/80]
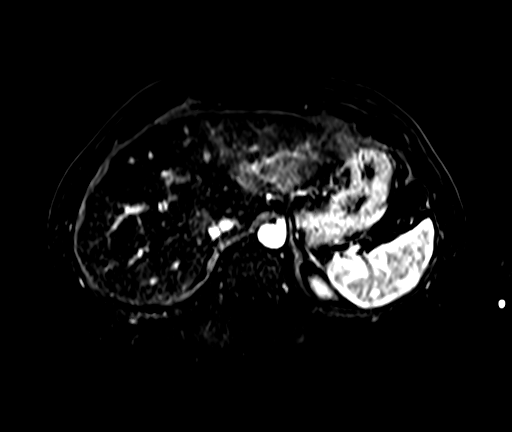
[im 80/80]
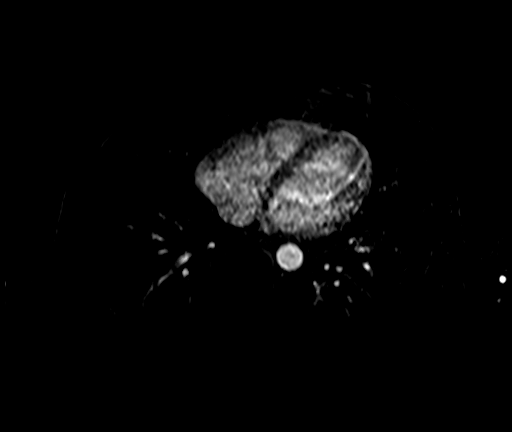

[Series 14: T1 dynamic post-contrast · axial · 2.7mm · 0.74mm/px · z∈[-123,-52]mm · 2 of 80 slices shown (3 of 3)]
[im 1/80]
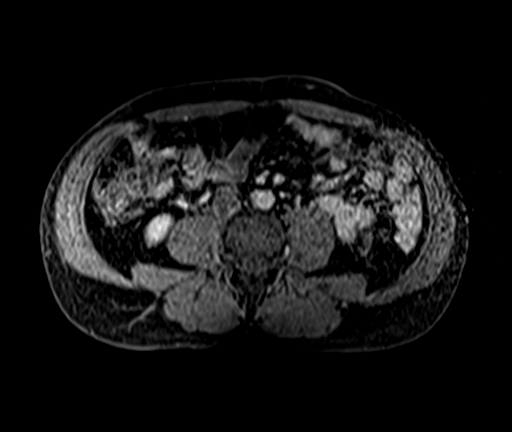
[im 27/80]
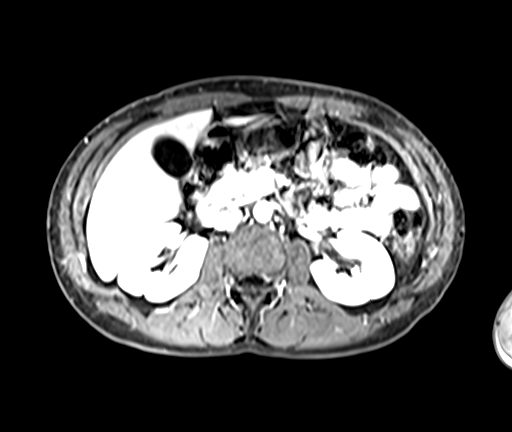

[26 of 48 positions shown; findings below may reference images not displayed]

FINDINGS: Lower chest: No acute findings.

Hepatobiliary: A total of 4 small benign hemangiomas are seen in the
right hepatic lobe, largest measuring 1.8 cm. These correspond to
the liver lesions seen on recent CT. No other liver masses are
identified. Gallbladder is unremarkable. No evidence of biliary
ductal dilatation.

Pancreas:  No mass or inflammatory changes.

Spleen:  Within normal limits in size and appearance.

Adrenals/Urinary Tract: A few tiny sub-cm left renal cysts are
noted. No masses identified. No evidence of hydronephrosis.

Stomach/Bowel: Visualized portion unremarkable.

Vascular/Lymphatic: No pathologically enlarged lymph nodes
identified. No abdominal aortic aneurysm.

Other:  None.

Musculoskeletal:  No suspicious bone lesions identified.
IMPRESSION: Small benign hepatic hemangiomas, which correspond with liver
lesions seen on recent CT.

No evidence of hepatic malignancy or other significant abnormality.

## 2020-02-02 ENCOUNTER — Encounter: Payer: Self-pay | Admitting: Psychiatry

## 2020-02-02 ENCOUNTER — Other Ambulatory Visit: Payer: Self-pay

## 2020-02-02 ENCOUNTER — Ambulatory Visit (INDEPENDENT_AMBULATORY_CARE_PROVIDER_SITE_OTHER): Payer: BC Managed Care – PPO | Admitting: Psychiatry

## 2020-02-02 DIAGNOSIS — F331 Major depressive disorder, recurrent, moderate: Secondary | ICD-10-CM

## 2020-02-02 NOTE — Progress Notes (Signed)
[facility] Counselor/Therapist Progress Note  Patient ID: Frank Webster, MRN: [id number],    Date: 02/02/2020  Time Spent: 45 minutes   Treatment Type: Individual Therapy  Reported Symptoms: anxiety, sad  Mental Status Exam:  Appearance:   Casual     Behavior:  Appropriate  Motor:  Normal  Speech/Language:   Clear and Coherent  Affect:  Appropriate  Mood:  anxious and sad  Thought process:  normal  Thought content:    WNL  Sensory/Perceptual disturbances:    WNL  Orientation:  oriented to person, place, time/date and situation  Attention:  Good  Concentration:  Good  Memory:  WNL  Fund of knowledge:   Good  Insight:    Good  Judgment:   Good  Impulse Control:  Good   Risk Assessment: Danger to Self:  No Self-injurious Behavior: No Danger to Others: No Duty to Warn:no Physical Aggression / Violence:No  Access to Firearms a concern: No  Gang Involvement:No   Subjective: The client was a few minutes late for his appointment today.  On his way to the appointment he saw what looked like a homeless man in distress.  "I stopped to help him.  I called EMS and waited until they arrived."  The client stated that the whole event made him feel good about himself.  We discussed how contributing to others helps the one who contributes. "Today I feel down."  The client stated that he has started with some controlled drinking.  He told his parents who are very supportive and he told his sponsees as well.  There were 2 events that happened that bothered the client.  He went to one of the gay clubs in town and [place].  "I got trashed."  He drove home parked his car irregularly and felt tremendous shame the next morning.  The second event is that he started drinking at the club once again and then did cocaine.  The client feels like he has learned from both of those circumstances.  The owner of the gay club attends NA.  He saw the client and called other people in NA and told  them.  The client was upset that no one called and talked to him.  He stated though, "I am not surprised this happened."  He was disappointed at the gossipy nature of what happened.  We discussed what the client could do going forward to not fall back into addiction or overdrinking.  He feels maybe he should not go back to the clubs. The client has also found a small Kava bar called [employer].  He had some kratom tea which he found very relaxing.  I asked the client if it triggered any opioid cravings.  He stated it absolutely did not.  The client has found the subculture in that bar to be very supportive and kind.  He has been pleased to have found it. The client still wants to relocate to [place]. [place], [place].  He will canvassed the area to find jobs that involve crisis services, emergency services and harm reduction.  He will do that between now and the next time I see him.  I used eye-movement with the client to reduce his level of disturbance in the subjective units of 5+ to less than 3.  Interventions: Dialectical Behavioral Therapy, Mindfulness Meditation, Motivational Interviewing, Solution-Oriented/Positive Psychology, [employer] Desensitization and Reprocessing (EMDR) and Insight-Oriented  Diagnosis:   ICD-10-CM   1. Major depressive disorder, recurrent episode, moderate (HCC)  F33.1     Plan: Job search in [place], [place], self-care, mood independent behavior, controlled drinking, positive self talk.  Noura Purpura, [facility]

## 2020-02-09 ENCOUNTER — Other Ambulatory Visit: Payer: Self-pay

## 2020-02-09 DIAGNOSIS — F331 Major depressive disorder, recurrent, moderate: Secondary | ICD-10-CM

## 2020-02-09 NOTE — Telephone Encounter (Signed)
Please contact pt to see how he is doing with Abilify. Recommend he schedule apt

## 2020-02-09 NOTE — Telephone Encounter (Signed)
Left him a message to let us know how Abilify is working and to schedule apt

## 2020-02-10 MED ORDER — ARIPIPRAZOLE 5 MG PO TABS: 5.0000 mg | ORAL_TABLET | Freq: Every day | ORAL | 0 refills | Status: DC

## 2020-02-10 NOTE — Telephone Encounter (Signed)
See Sarah's message

## 2020-02-10 NOTE — Telephone Encounter (Signed)
Patient called and said that he wants to trey the trintellix that jessica recommended for him since the vibryd is so expensive. So please send that in as well

## 2020-02-12 ENCOUNTER — Telehealth: Payer: Self-pay | Admitting: Psychiatry

## 2020-02-12 DIAGNOSIS — F331 Major depressive disorder, recurrent, moderate: Secondary | ICD-10-CM

## 2020-02-12 DIAGNOSIS — F4310 Post-traumatic stress disorder, unspecified: Secondary | ICD-10-CM

## 2020-02-12 MED ORDER — VORTIOXETINE HBR 10 MG PO TABS: 10.0000 mg | ORAL_TABLET | Freq: Every day | ORAL | 0 refills | Status: DC

## 2020-02-12 NOTE — Telephone Encounter (Signed)
Dai called to say he is almost out of the Viibryd and thought you were going to call in replacement for the Viibryd but that hasn't been done so he concerned about what to do about his medication.  Also on the Abilify it has been working.  At first it worked very well but seems to be tapering now.  He also has gained 10 pounds.  So doesn't know if you want to address that now too.  He has appt for 02/23/20.  Pharmacy is [facility] on [address]

## 2020-02-12 NOTE — Telephone Encounter (Signed)
Returned call to pt. He reports that he noticed an initial improvement in mood s/s with starting Abilify and that it is no longer as beneficial. He reports that has gained 10 lbs since starting Abilify. He reports that he is having predominantly more depressive s/s than anxiety s/s.   Decrease Viibryd 20 mg x 1 week, then 10 mg po qd x 1 week, then stop.  Start Trintellix 5 mg x 1 week, then 10 mg po qd.   Stop Abilify.  Will pull samples for pt.

## 2020-02-23 ENCOUNTER — Ambulatory Visit: Payer: BC Managed Care – PPO | Admitting: Psychiatry

## 2020-02-24 ENCOUNTER — Telehealth: Payer: Self-pay | Admitting: Psychiatry

## 2020-02-24 NOTE — Telephone Encounter (Signed)
Attempted to reach pt again. L/M recommending that he resume Viibryd 10 mg po qd for another week while also taking Trintellix 10 mg po qd since Trintellix has a long half-life and can take longer for blood levels to reach therapeutic response. Advised pt to call office to f/u in 1 week. Advised pt to call office if depressive s/s worsen or do not improve.

## 2020-02-24 NOTE — Telephone Encounter (Signed)
Patient called and said that he went off of viibryd and has been on trintellix for 2 weeks.Marland Kitchen He is not doing well he is very depressed and yesterday he had sucidal  Thoughts. Please give him a call at what to do at [phone]

## 2020-03-01 ENCOUNTER — Other Ambulatory Visit: Payer: Self-pay

## 2020-03-01 ENCOUNTER — Encounter: Payer: Self-pay | Admitting: Psychiatry

## 2020-03-01 ENCOUNTER — Telehealth: Payer: Self-pay | Admitting: Psychiatry

## 2020-03-01 ENCOUNTER — Ambulatory Visit (INDEPENDENT_AMBULATORY_CARE_PROVIDER_SITE_OTHER): Payer: BC Managed Care – PPO | Admitting: Psychiatry

## 2020-03-01 DIAGNOSIS — F331 Major depressive disorder, recurrent, moderate: Secondary | ICD-10-CM | POA: Diagnosis not present

## 2020-03-01 DIAGNOSIS — F4310 Post-traumatic stress disorder, unspecified: Secondary | ICD-10-CM

## 2020-03-01 DIAGNOSIS — F39 Unspecified mood [affective] disorder: Secondary | ICD-10-CM

## 2020-03-01 MED ORDER — VIIBRYD 10 MG PO TABS: 10.0000 mg | ORAL_TABLET | Freq: Every day | ORAL | 0 refills | Status: DC

## 2020-03-01 MED ORDER — VORTIOXETINE HBR 20 MG PO TABS: 20.0000 mg | ORAL_TABLET | Freq: Every day | ORAL | 1 refills | Status: DC

## 2020-03-01 NOTE — Progress Notes (Signed)
[facility] Counselor/Therapist Progress Note  Patient ID: Frank Webster, MRN: [id number],    Date: 03/01/2020  Time Spent: 59 minutes   Treatment Type: Individual Therapy  Reported Symptoms: sad, depressed, lack of motivation, anhedonia, thoughts of death  Mental Status Exam:  Appearance:   Casual     Behavior:  Appropriate  Motor:  Normal  Speech/Language:   Clear and Coherent  Affect:  Depressed  Mood:  anxious, depressed and sad  Thought process:  normal  Thought content:    WNL  Sensory/Perceptual disturbances:    WNL  Orientation:  oriented to person, place, time/date and situation  Attention:  Good  Concentration:  Good  Memory:  WNL  Fund of knowledge:   Good  Insight:    Good  Judgment:   Good  Impulse Control:  Good   Risk Assessment: Danger to Self:  Thoughts of death with no intent or plan. Self-injurious Behavior: No Danger to Others: No Duty to Warn:no Physical Aggression / Violence:No  Access to Firearms a concern: No  Gang Involvement:No   Subjective: The client states today, "there is no joy in my life.  I hate waking up."  He describes having intense thoughts of death over the last 2 weeks.  He is currently tapering off of Viibryd and onto Trintellix.  Today is his last day of the Viibryd.  He is leaving to go on vacation and does not know if he should increase his Trintellix and if he should really discontinue his Viibryd?  During the course of the session, I messaged Traci Scroggins, LPN.  I asked her questions the client had.  She contacted Corie Chiquito, PMHNP who said, "Ok to increase Trintellix to 20 mg po qd as long as he is not having any significant nausea or GI side effects. I would recommend that he not taper off the Viibryd just yet, especially with getting ready to go out of town, since it can take awhile for the blood levels to increase with Trintellix. I can pull some Viibryd samples for him to pick up when he checks out if he needs  some more. Can also send in Trintellix 20 mg. Script sent for Trintellix 20 mg to pharmacy. Samples for Viibryd and Trintellix were pulled and are at the front desk with Maralyn Sago for him to pick up when he checks out."  The client was very grateful and understood the instructions.  Today I used the bilateral stimulation hand paddles to help reduce the client's sense of anhedonia and despair.  I had him visualize being on the beach in [place], [place] where he is traveling to.  He was on a white beach with palm trees and blue water.  The client was able to do that and stated he felt much better.  I pointed out to the client that he has been under a lot of stress and his current mood is the result of poor biochemistry in his brain.  What he believes is not necessarily true.  The client was able to acknowledge this.  As he continued to process I pointed out that he needed to be very mindful and keep his mind occupied so as not to fall back into the negative thinking.  He agreed he would do so.  I suggested that he listen to a pod cast while he drives.  He was unaware of how to get a pod cast.  I helped the client access a pod cast app on  his phone.  He is interested in true crime and I helped him find some podcasts on that subject.  He was considerably brighter at the end of the session.  His subjective units of distress went from a 9 to less than 5.  He agreed that if he had intense suicidal thoughts that he would contact me.    Interventions: Mindfulness Meditation, Motivational Interviewing, Solution-Oriented/Positive Psychology, [employer] Desensitization and Reprocessing (EMDR) and Insight-Oriented  Diagnosis:   ICD-10-CM   1. Major depressive disorder, recurrent episode, moderate (HCC)  F33.1     Plan: Mood independent behavior, positive self talk, distraction, engagement with other activities, sun exposure, omega-3 fatty acids, regular sleep schedule, exercise, social network.  Katharine Rochefort, [facility]

## 2020-03-01 NOTE — Telephone Encounter (Signed)
Received message from pt's therapist that pt reported in session that he is having increased depression and is about to complete Viibryd taper and is about to go out of town. He is asking if he can increase Trintellix to 20 mg po qd. Communicated that it would be ok to increase Trintellix to 20 mg po qd as long as he is not having significant GI side effects. Recommend continuing Viibryd 10 mg po qd for now since Trintellix will take some time to become effective and to avoid any further worsening mood s/s or discontinuation s/s while out of town. Will send script for Trintellix 20 mg po qd and provide additional samples of Viibryd.

## 2020-03-09 ENCOUNTER — Ambulatory Visit (INDEPENDENT_AMBULATORY_CARE_PROVIDER_SITE_OTHER): Payer: BC Managed Care – PPO | Admitting: Psychiatry

## 2020-03-09 ENCOUNTER — Telehealth: Payer: BC Managed Care – PPO | Admitting: Psychiatry

## 2020-03-09 ENCOUNTER — Other Ambulatory Visit: Payer: Self-pay

## 2020-03-09 DIAGNOSIS — F331 Major depressive disorder, recurrent, moderate: Secondary | ICD-10-CM | POA: Diagnosis not present

## 2020-03-09 DIAGNOSIS — F329 Major depressive disorder, single episode, unspecified: Secondary | ICD-10-CM

## 2020-03-09 DIAGNOSIS — F39 Unspecified mood [affective] disorder: Secondary | ICD-10-CM | POA: Diagnosis not present

## 2020-03-09 DIAGNOSIS — F32A Depression, unspecified: Secondary | ICD-10-CM

## 2020-03-09 DIAGNOSIS — F4310 Post-traumatic stress disorder, unspecified: Secondary | ICD-10-CM

## 2020-03-09 MED ORDER — SERTRALINE HCL 50 MG PO TABS: ORAL_TABLET | ORAL | 1 refills | Status: DC

## 2020-03-09 MED ORDER — LAMOTRIGINE 200 MG PO TABS: 200.0000 mg | ORAL_TABLET | Freq: Every day | ORAL | 2 refills | Status: DC

## 2020-03-09 NOTE — Patient Instructions (Signed)
Recommend contacting [facility] at [phone].   [email or web address]

## 2020-03-09 NOTE — Progress Notes (Signed)
Frank Webster [id number] 12/26/1975 44 y.o.  Subjective:   Patient ID:  Frank Webster is a 43 y.o. (DOB [birth year]) male.  Chief Complaint:  Chief Complaint  Patient presents with  . Depression  . Anxiety    HPI Frank Webster presents to the office emergently today for follow-up of worsening depression and anxiety. He reports he had one day where he had SI before going on vacation and was re-started on low dose Viibryd to extend cross-titration from [place] to [employer]. He was on vacation last week. He reports that on vacation he had severe depression and felt withdrawn. He went to the beach and "finally felt at peace." He reports that he was not able to completely relax on vacation and was preoccupied with trying to problem solve about how he could improve his mood, outlook, quality of life, etc. Had a panic attack the first day he was at the beach. He reports that he had some panic upon returning home while driving to pick up his dog from a friend in anticipation of social interaction.   He reports that when he returned from vacation last night he started crying. Had a period today where he was crying uncontrollably for at least an hour. He reports that he has been feeling overwhelmed and "like everything is too much for me" to include his job and taking care of his dog. Mood has been depressed. He reports feeling hopeless. He reports that he is feeling "scared" about several different things, to include that he will never be happy again. Sleeping ok. Appetite was ok on vacation and today has no desire to eat. Energy and motivation have been low. Concentration has been "nonexistent." He reports passive death wishes. Denies SI.   Has been at Trintellix 20 mg po qd for one week.   Past medication trials: Prozac-vomiting Celexa Cymbalta Viibryd-initially helpful, then no longer as effective Trintellix- Worsening depression, vague SI BuSpar- Has been helpful for  anxiety Wellbutrin Lamictal Trileptal Trazodone Doxazosin Clonidine Trazodone-excessive somnolence, vivid dreams Doxepin-ineffective for insomnia Belsomra- adverse reaction Lunesta- Helped for about 2 nights and then no longer effective. Klonopin- Effective for insomnia. Hydroxyzine- ineffective. Melatonin-Some benefit but does not sleep throughout the night. Gabapentin Klonopin Abilify- helped initially and then was no longer effective. Experienced wt gain.     PHQ2-9     Video Visit from 09/23/2019 in [facility] Office Visit from 04/09/2019 in [facility] Office Visit from 09/22/2018 in [facility] Office Visit from 09/26/2017 in [facility] Office Visit from 12/11/2016 in [facility]  PHQ-2 Total Score  0  2  0  0  2  PHQ-9 Total Score  -  3  -  -  -       Review of Systems:  Review of Systems  Medications: I have reviewed the patient's current medications.  Current Outpatient Medications  Medication Sig Dispense Refill  . cetirizine (ZYRTEC) 10 MG tablet Take 10 mg by mouth daily.    . cholecalciferol (VITAMIN D3) 25 MCG (1000 UNIT) tablet Take 2,000 Units by mouth daily.    . fluticasone (FLONASE) 50 MCG/ACT nasal spray Place 2 sprays into both nostrils daily. (Patient taking differently: Place 2 sprays into both nostrils as needed for allergies. ) 16 g 0  . ibuprofen (ADVIL) 600 MG tablet Take 1 tablet (600 mg total) by mouth  every 6 (six) hours as needed for moderate pain. 30 tablet 0  . Multiple Vitamins-Minerals (CENTRUM MEN PO) Take by mouth.    . Probiotic Product (PROBIOTIC PO) Take by mouth.    . rosuvastatin (CRESTOR) 10 MG tablet TAKE 1 TABLET(10 MG) BY MOUTH DAILY 30 tablet 5  . TRIUMEQ 600-50-300 MG tablet TAKE ONE TABLET BY MOUTH ONCE DAILY. STORE IN ORIGINAL BOTTLE AT  ROOM TEMPERATURE. 30 tablet 3  . lamoTRIgine (LAMICTAL) 200 MG tablet Take 1 tablet (200 mg total) by mouth daily. 30 tablet 2  . sertraline (ZOLOFT) 50 MG tablet Take 1/2 tab po qd x 2-4 days, then 1 tab po qd x 1 week, then 2 tabs po qd 60 tablet 1   No current facility-administered medications for this visit.    Medication Side Effects: Other: Worsening mood s/s.  Allergies: No Known Allergies  Past Medical History:  Diagnosis Date  . Anxiety state 04/05/2016  . CANDIDIASIS, ORAL 10/11/2010   Qualifier: Diagnosis of  By: Magdalen Spatz NP, Romona Curls    . Decreased libido 04/05/2016  . Depression, major ([place]) 10/31/2010   Qualifier: Diagnosis of  By: Magdalen Spatz NP, Romona Curls    . Dermatophytosis of unspecified site 10/24/2010   Qualifier: Diagnosis of  By: Magdalen Spatz NP, Romona Curls    . Erectile dysfunction 09/26/2017  . FOLLICULITIS 3/55/7322   Qualifier: Diagnosis of  By: Magdalen Spatz NP, Romona Curls    . Glossitis 10/03/2010   Qualifier: Diagnosis of  By: Magdalen Spatz NP, Romona Curls    . Hemorrhoid 09/29/2012  . HIV infection ([place])   . Inflamed seborrheic keratosis 10/11/2010   Qualifier: Diagnosis of  By: Magdalen Spatz NP, Romona Curls    . Left shoulder pain 03/17/2015  . Nonallopathic lesion of cervical region 04/14/2015  . Nonallopathic lesion of lumbosacral region 04/14/2015  . Nonallopathic lesion of thoracic region 04/14/2015  . Other specified disorder of rectum and anus 10/03/2010   Qualifier: Diagnosis of  By: Magdalen Spatz NP, Romona Curls    . OTITIS EXTERNA 06/14/2010   Qualifier: Diagnosis of  By: Tomma Lightning MD, Claiborne Billings    . Pruritus ani 10/24/2010   Qualifier: Diagnosis of  By: Magdalen Spatz NP, Romona Curls    . Racing heart beat 04/21/2015  . Scapular dyskinesis 03/24/2015  . Seasonal allergies 01/27/2014  . Sprain of acromioclavicular joint 03/17/2015  . STD exposure 09/26/2017  . Substance abuse ([place]) 01/27/2014  . Substance abuse in remission ([place])   . Testicular/scrotal pain 04/21/2015  . Tobacco abuse  03/22/2015  . URI 10/31/2010   Qualifier: Diagnosis of  By: Magdalen Spatz NP, Romona Curls    . 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 Cancer Maternal Grandmother   . Tremor Father   . Eating disorder Sister   . Depression Sister   . Tremor Paternal Uncle   . Tremor Paternal Grandfather   . Eating disorder Sister   . Eating disorder Sister   . Arthritis Neg Hx   . Lupus Neg Hx   . Rosacea Neg Hx     Social History   Socioeconomic History  . Marital status: Single    Spouse name: Not on file  . Number of children: 0  . Years of education: 2  . Highest education level: Not on file  Occupational History  . Occupation: Server  Tobacco Use  . Smoking status: Former Smoker    Packs/day: 0.75    Years:  25.00    Pack years: 18.75    Types: Cigarettes    Quit date: 03/04/2016    Years since quitting: 4.0  . Smokeless tobacco: Never Used  Substance and Sexual Activity  . 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 Sexual activity: Yes    Partners: Male    Birth control/protection: Condom  Other Topics Concern  . Not on file  Social History Narrative   Fun: Yoga, hiking, going to the gym.    Social Determinants of Health   Financial Resource Strain:   . Difficulty of Paying Living Expenses:   Food Insecurity:   . Worried About Programme researcher, broadcasting/film/video in the Last Year:   . Barista in the Last Year:   Transportation Needs:   . Freight forwarder (Medical):   Marland Kitchen Lack of Transportation (Non-Medical):   Physical Activity:   . Days of Exercise per Week:   . Minutes of Exercise per Session:   Stress:   . Feeling of Stress :   Social Connections:   . Frequency of Communication with Friends and Family:   . Frequency of Social Gatherings with Friends and Family:   . Attends Religious Services:   .  Active Member of Clubs or Organizations:   . Attends Banker Meetings:   Marland Kitchen Marital Status:   Intimate Partner Violence:   . Fear of Current or Ex-Partner:   . Emotionally Abused:   Marland Kitchen Physically Abused:   . Sexually Abused:     Past Medical History, Surgical history, Social history, and Family history were reviewed and updated as appropriate.   Please see review of systems for further details on the patient's review from today.   Objective:   Physical Exam:  There were no vitals taken for this visit.  Physical Exam Constitutional:      General: He is not in acute distress. Musculoskeletal:        General: No deformity.  Neurological:     Mental Status: He is alert and oriented to person, place, and time.     Coordination: Coordination normal.  Psychiatric:        Attention and Perception: Attention and perception normal. He does not perceive auditory or visual hallucinations.        Mood and Affect: Mood is anxious and depressed. Affect is tearful. Affect is not labile, blunt, angry or inappropriate.        Speech: Speech normal.        Behavior: Behavior normal. Behavior is cooperative.        Thought Content: Thought content normal. Thought content is not paranoid or delusional. Thought content does not include homicidal or suicidal ideation. Thought content does not include homicidal or suicidal plan.        Cognition and Memory: Cognition and memory normal.        Judgment: Judgment normal.     Comments: Insight intact     Lab Review:     Component Value Date/Time   NA 138 08/31/2019 1436   K 4.4 08/31/2019 1436   CL 101 08/31/2019 1436   CO2 30 08/31/2019 1436   GLUCOSE 90 08/31/2019 1436   GLUCOSE 76 02/28/2009 0000   BUN 16 08/31/2019 1436   CREATININE 0.96 08/31/2019 1436   CALCIUM 9.9 08/31/2019 1436   PROT 7.0 08/31/2019 1436  ALBUMIN 4.6 05/29/2016 1441   AST 20 08/31/2019 1436   ALT 29 08/31/2019 1436   ALKPHOS 70 05/29/2016 1441    BILITOT 0.4 08/31/2019 1436   GFRNONAA 95 08/31/2019 1436   GFRAA 110 08/31/2019 1436       Component Value Date/Time   WBC 5.4 08/31/2019 1436   RBC 4.51 08/31/2019 1436   HGB 14.5 08/31/2019 1436   HCT 42.4 08/31/2019 1436   PLT 278 08/31/2019 1436   MCV 94.0 08/31/2019 1436   MCH 32.2 08/31/2019 1436   MCHC 34.2 08/31/2019 1436   RDW 13.2 08/31/2019 1436   LYMPHSABS 1,658 08/31/2019 1436   MONOABS 360 05/29/2016 1441   EOSABS 38 08/31/2019 1436   BASOSABS 11 08/31/2019 1436    No results found for: POCLITH, LITHIUM   No results found for: PHENYTOIN, PHENOBARB, VALPROATE, CBMZ   .res Assessment: Plan:   Discussed discontinuing Trintellix since patient has had acute worsening in depression and passive death wishes since starting Trintellix.  Discussed that Trintellix could be stopped abruptly due to long half-life and possible adverse effects. Discussed potential benefits, risks, and side effects of sertraline and patient agrees to trial of sertraline.  Will start sertraline 25 mg daily for 2 to 4 days, then increase to 50 mg daily for 1 week, then increase to 100 mg daily for mood and anxiety. Continue Lamictal 200 mg daily for mood signs and symptoms. Discussed potential benefits of TMS since patient has tried several medications with either adverse effects and/or minimal improvement.  Provided patient with some information about TMS and referral information.  Patient reports that he plans on contacting [facility].  Patient to follow-up in 4 to 6 weeks or sooner if clinically indicated. Recommend continuing psychotherapy with Sherron Monday, LC [facility]. Patient advised to contact office with any questions, adverse effects, or acute worsening in signs and symptoms.  Jahziah was seen today for depression and anxiety.  Diagnoses and all orders for this visit:  PTSD (post-traumatic stress disorder) -     sertraline (ZOLOFT) 50 MG tablet; Take 1/2 tab po qd x 2-4 days, then 1 tab  po qd x 1 week, then 2 tabs po qd  Major depressive disorder, recurrent episode, moderate (HCC)  Depression, unspecified depression type -     sertraline (ZOLOFT) 50 MG tablet; Take 1/2 tab po qd x 2-4 days, then 1 tab po qd x 1 week, then 2 tabs po qd  Mood disorder (HCC) -     lamoTRIgine (LAMICTAL) 200 MG tablet; Take 1 tablet (200 mg total) by mouth daily.     Please see After Visit Summary for patient specific instructions.  Future Appointments  Date Time Dametria Tuzzolino Department Center  03/16/2020  2:00 PM [facility]  04/05/2020  2:00 PM May, [place], [facility] CP-[facility] None  04/06/2020 10:30 AM Corie Chiquito, PMHNP CP-CP None  04/12/2020 11:30 AM Blanchard Kelch, NP [facility]  05/03/2020 12:00 PM May, [place], [facility] CP-CP None    No orders of the defined types were placed in this encounter.   -------------------------------

## 2020-03-10 ENCOUNTER — Other Ambulatory Visit: Payer: BC Managed Care – PPO

## 2020-03-10 ENCOUNTER — Encounter: Payer: Self-pay | Admitting: Psychiatry

## 2020-03-15 ENCOUNTER — Telehealth: Payer: Self-pay

## 2020-03-15 ENCOUNTER — Telehealth: Payer: Self-pay | Admitting: Psychiatry

## 2020-03-15 NOTE — Telephone Encounter (Signed)
Pt called and stated he was up all last night from "[place]" he stated he started Zoloft last week but has not had any side effects until last night. He is requesting a call back from Nurse or Shanda Bumps.  [phone]

## 2020-03-15 NOTE — Telephone Encounter (Signed)
COVID-19 Pre-Screening Questions:03/15/20  Do you currently have a fever (>100 F), chills or unexplained body aches?NO  Are you currently experiencing new cough, shortness of breath, sore throat, runny nose?NO .  Have you recently travelled outside the state of [place] in the last 14 days? NO .  Have you been in contact with someone that is currently pending confirmation of Covid19 testing or has been confirmed to have the Covid19 virus? NO  **If the patient answers NO to ALL questions -  advise the patient to please call the clinic before coming to the office should any symptoms develop.     

## 2020-03-16 ENCOUNTER — Other Ambulatory Visit: Payer: BC Managed Care – PPO

## 2020-03-16 ENCOUNTER — Other Ambulatory Visit: Payer: Self-pay

## 2020-03-16 DIAGNOSIS — B20 Human immunodeficiency virus [HIV] disease: Secondary | ICD-10-CM

## 2020-03-16 DIAGNOSIS — N529 Male erectile dysfunction, unspecified: Secondary | ICD-10-CM

## 2020-03-16 NOTE — Telephone Encounter (Signed)
LM to call back to discuss Frank Webster's recommendation

## 2020-03-17 LAB — T-HELPER CELL (CD4) - (RCID CLINIC ONLY)
CD4 % Helper T Cell: 42 % (ref 33–65)
CD4 T Cell Abs: 1152 /uL (ref 400–1790)

## 2020-03-18 LAB — CBC WITH DIFFERENTIAL/PLATELET
Absolute Monocytes: 411 cells/uL (ref 200–950)
Basophils Absolute: 21 cells/uL (ref 0–200)
Basophils Relative: 0.4 %
Eosinophils Absolute: 120 cells/uL (ref 15–500)
Eosinophils Relative: 2.3 %
HCT: 43 % (ref 38.5–50.0)
Hemoglobin: 14.4 g/dL (ref 13.2–17.1)
Lymphs Abs: 2496 cells/uL (ref 850–3900)
MCH: 32.1 pg (ref 27.0–33.0)
MCHC: 33.5 g/dL (ref 32.0–36.0)
MCV: 95.8 fL (ref 80.0–100.0)
MPV: 9.8 fL (ref 7.5–12.5)
Monocytes Relative: 7.9 %
Neutro Abs: 2153 cells/uL (ref 1500–7800)
Neutrophils Relative %: 41.4 %
Platelets: 291 10*3/uL (ref 140–400)
RBC: 4.49 10*6/uL (ref 4.20–5.80)
RDW: 13.2 % (ref 11.0–15.0)
Total Lymphocyte: 48 %
WBC: 5.2 10*3/uL (ref 3.8–10.8)

## 2020-03-18 LAB — TESTOSTERONE TOTAL,FREE,BIO, MALES
Albumin: 4.4 g/dL (ref 3.6–5.1)
Sex Hormone Binding: 54 nmol/L — ABNORMAL HIGH (ref 10–50)
Testosterone, Bioavailable: 100.5 ng/dL — ABNORMAL LOW (ref >=110.0)
Testosterone, Free: 49.9 pg/mL (ref 46.0–224.0)
Testosterone: 560 ng/dL (ref 250–827)

## 2020-03-18 LAB — COMPREHENSIVE METABOLIC PANEL
AG Ratio: 2 (calc) (ref 1.0–2.5)
ALT: 28 U/L (ref 9–46)
AST: 18 U/L (ref 10–40)
Albumin: 4.4 g/dL (ref 3.6–5.1)
Alkaline phosphatase (APISO): 60 U/L (ref 36–130)
BUN: 11 mg/dL (ref 7–25)
CO2: 28 mmol/L (ref 20–32)
Calcium: 9.4 mg/dL (ref 8.6–10.3)
Chloride: 101 mmol/L (ref 98–110)
Creat: 0.82 mg/dL (ref 0.60–1.35)
Globulin: 2.2 g/dL (calc) (ref 1.9–3.7)
Glucose, Bld: 116 mg/dL — ABNORMAL HIGH (ref 65–99)
Potassium: 4.3 mmol/L (ref 3.5–5.3)
Sodium: 136 mmol/L (ref 135–146)
Total Bilirubin: 0.3 mg/dL (ref 0.2–1.2)
Total Protein: 6.6 g/dL (ref 6.1–8.1)

## 2020-03-18 LAB — HIV-1 RNA QUANT-NO REFLEX-BLD
HIV 1 RNA Quant: <20 copies/mL — AB
HIV-1 RNA Quant, Log: <1.3 Log copies/mL — AB

## 2020-03-18 LAB — PSA: PSA: 0.6 ng/mL (ref <=4.0)

## 2020-03-18 NOTE — Telephone Encounter (Signed)
LM following up on his phone message and to call back with update

## 2020-03-21 ENCOUNTER — Other Ambulatory Visit: Payer: Self-pay | Admitting: Infectious Diseases

## 2020-03-21 ENCOUNTER — Telehealth: Payer: BC Managed Care – PPO | Admitting: Psychiatry

## 2020-03-21 DIAGNOSIS — N529 Male erectile dysfunction, unspecified: Secondary | ICD-10-CM

## 2020-03-21 NOTE — Telephone Encounter (Signed)
Pt called back an stated that he takes Sertraline in the morning. Pt would like a call back to discuss next steps. Pt also has started taking vitamins and want to discuss if taking them is safe. Pt also ok with taking lower dose.

## 2020-03-21 NOTE — Telephone Encounter (Signed)
Returned call to pt. He reports that he has been having difficulty with mood and has been contemplating taking a leave of absence.   He reports that he has been taking supplements and has noticed some improvement in mood. He reports that he was able to work 4 hours yesterday and today worked all day.   He reports that he has been taking some supplements contain 5-HTP.   He reports that he is fearful of feeling the way that he felt coming off Viibryd and other medications in the past.   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 He reports that RLS occurred once last week and then resolved. Had RLS and panic the last 2 nights after increasing Sertraline from 50 mg to 100 mg.   Decrease dose of Sertraline to 75 mg po qd until RLS resolves and then can increase to 100 mg as tolerated.   Patient advised to contact office with any questions, adverse effects, or acute worsening in signs and symptoms.

## 2020-03-23 ENCOUNTER — Telehealth: Payer: BC Managed Care – PPO | Admitting: Infectious Diseases

## 2020-03-24 MED ORDER — VALACYCLOVIR HCL 1 G PO TABS: 1000.0000 mg | ORAL_TABLET | Freq: Two times a day (BID) | ORAL | 0 refills | Status: AC

## 2020-04-05 ENCOUNTER — Other Ambulatory Visit: Payer: Self-pay

## 2020-04-05 ENCOUNTER — Ambulatory Visit (INDEPENDENT_AMBULATORY_CARE_PROVIDER_SITE_OTHER): Payer: BC Managed Care – PPO | Admitting: Psychiatry

## 2020-04-05 DIAGNOSIS — F331 Major depressive disorder, recurrent, moderate: Secondary | ICD-10-CM

## 2020-04-05 NOTE — Progress Notes (Deleted)
      [facility] Counselor/Therapist Progress Note  Patient ID: Frank Webster, MRN: [id number],    Date: 04/05/2020  Time Spent: ***   Treatment Type: {CHL AMB THERAPY [phone]}  Reported Symptoms: ***  Mental Status Exam:  Appearance:   {PSY:22683}     Behavior:  {PSY:21022743}  Motor:  {PSY:22302}  Speech/Language:   {PSY:22685}  Affect:  {PSY:22687}  Mood:  {PSY:31886}  Thought process:  {PSY:31888}  Thought content:    {[phone]}  Sensory/Perceptual disturbances:    {PSY:708-076-9211}  Orientation:  {PSY:30297}  Attention:  {PSY:22877}  Concentration:  {PSY:320-619-6676}  Memory:  {PSY:204-549-4673}  Fund of knowledge:   {PSY:320-619-6676}  Insight:    {PSY:320-619-6676}  Judgment:   {PSY:320-619-6676}  Impulse Control:  {PSY:320-619-6676}   Risk Assessment: Danger to Self:  {PSY:22692} Self-injurious Behavior: {PSY:22692} Danger to Others: {PSY:22692} Duty to Warn:{PSY:311194} Physical Aggression / Violence:{PSY:21197} Access to Firearms a concern: {PSY:21197} Gang Involvement:{PSY:21197}  Subjective: ***   Interventions: {PSY:574-006-9063}  Diagnosis:No diagnosis found.  Plan: ***  Shameria Trimarco, [facility]

## 2020-04-06 ENCOUNTER — Ambulatory Visit (INDEPENDENT_AMBULATORY_CARE_PROVIDER_SITE_OTHER): Payer: BC Managed Care – PPO | Admitting: Psychiatry

## 2020-04-06 ENCOUNTER — Encounter: Payer: Self-pay | Admitting: Psychiatry

## 2020-04-06 DIAGNOSIS — F4310 Post-traumatic stress disorder, unspecified: Secondary | ICD-10-CM | POA: Diagnosis not present

## 2020-04-06 DIAGNOSIS — F329 Major depressive disorder, single episode, unspecified: Secondary | ICD-10-CM

## 2020-04-06 DIAGNOSIS — F32A Depression, unspecified: Secondary | ICD-10-CM

## 2020-04-06 MED ORDER — SERTRALINE HCL 50 MG PO TABS: 50.0000 mg | ORAL_TABLET | Freq: Two times a day (BID) | ORAL | 1 refills | Status: DC

## 2020-04-06 NOTE — Progress Notes (Signed)
Frank Webster [id number] June 11, 1976 44 y.o.  Subjective:   Patient ID:  Frank Webster is a 44 y.o. (DOB April 13, 1976) male.  Chief Complaint:  Chief Complaint  Patient presents with  . Depression  . Anxiety    HPI Frank Webster presents to the office today for follow-up of depression and anxiety. He reports that he is not doing well and is having worsening anxiety and depression. He reports that he has been feeling hopeless. He reports that he awakens with nausea. He reports, "my whole body hurts." Frequent headaches. "I feel like I am sliding back into being non-functional." Denies full blown panic attacks. He describes energy and motivational as nonexistent. Mood has been persistently sad. Some irritability. He reports sleeping excessively. Slept 10 hours last night and will likely nap today. Appetite has been decreased and is not hungry until later in the day. Reports that he feels slightly better late in the day and s/s are worse in the morning. "Every morning the weight of the world is on me." He reports poor concentration and focus. Diminished ability to enjoy things. More socially withdrawn. Having some social interaction. Denies SI.   Was able to work until the last few days.   No longer having RLS. Tried moving Sertraline to HS last night and nausea did not improve.   Started TMS last week.   Past medication trials: Prozac-vomiting Celexa Cymbalta Viibryd-initially helpful, then no longer as effective Trintellix- Worsening depression, vague SI BuSpar- Has been helpful for anxiety Wellbutrin Lamictal Trileptal Trazodone Doxazosin Clonidine Trazodone-excessive somnolence, vivid dreams Doxepin-ineffective for insomnia Belsomra- adverse reaction Lunesta- Helped for about 2 nights and then no longer effective. Klonopin- Effective for insomnia. Hydroxyzine- ineffective. Melatonin-Some benefit but does not sleep throughout the  night. Gabapentin Klonopin Abilify- helped initially and then was no longer effective. Experienced wt gain.    PHQ2-9     Video Visit from 09/23/2019 in [facility] Office Visit from 04/09/2019 in [facility] Office Visit from 09/22/2018 in [facility] Office Visit from 09/26/2017 in [facility] Disease Office Visit from 12/11/2016 in [facility]ious Disease  PHQ-2 Total Score 0 2 0 0 2  PHQ-9 Total Score -- 3 -- -- --       Review of Systems:  Review of Systems  Gastrointestinal: Positive for constipation and nausea. Negative for vomiting.  Musculoskeletal: Positive for back pain. Negative for gait problem.  Neurological: Positive for headaches. Negative for tremors.  Psychiatric/Behavioral:       Please refer to HPI  was diagnosed with low testosterone and has apt with urologist.   Medications: I have reviewed the patient's current medications.  Current Outpatient Medications  Medication Sig Dispense Refill  . cetirizine (ZYRTEC) 10 MG tablet Take 10 mg by mouth daily.    . cholecalciferol (VITAMIN D3) 25 MCG (1000 UNIT) tablet Take 2,000 Units by mouth daily.    . fluticasone (FLONASE) 50 MCG/ACT nasal spray Place 2 sprays into both nostrils daily. (Patient taking differently: Place 2 sprays into both nostrils as needed for allergies. ) 16 g 0  . ibuprofen (ADVIL) 600 MG tablet Take 1 tablet (600 mg total) by mouth every 6 (six) hours as needed for moderate pain. 30 tablet 0  . lamoTRIgine (LAMICTAL) 200 MG tablet Take 1 tablet (200 mg total) by mouth daily. 30 tablet 2  . Multiple Vitamins-Minerals (CENTRUM MEN  PO) Take by mouth.    . Probiotic Product (PROBIOTIC PO) Take by mouth.    . rosuvastatin (CRESTOR) 10 MG tablet TAKE 1 TABLET(10 MG) BY MOUTH DAILY 30 tablet 5  . sertraline (ZOLOFT) 50 MG tablet Take 1  tablet (50 mg total) by mouth in the morning and at bedtime. 60 tablet 1  . TRIUMEQ 600-50-300 MG tablet TAKE ONE TABLET BY MOUTH ONCE DAILY. STORE IN ORIGINAL BOTTLE AT ROOM TEMPERATURE. 30 tablet 3   No current facility-administered medications for this visit.    Medication Side Effects: Nausea  Allergies: No Known Allergies  Past Medical History:  Diagnosis Date  . Anxiety state 04/05/2016  . CANDIDIASIS, ORAL 10/11/2010   Qualifier: Diagnosis of  By: Magdalen Spatz NP, Romona Curls    . Decreased libido 04/05/2016  . Depression, major ([place]) 10/31/2010   Qualifier: Diagnosis of  By: Magdalen Spatz NP, Romona Curls    . Dermatophytosis of unspecified site 10/24/2010   Qualifier: Diagnosis of  By: Magdalen Spatz NP, Romona Curls    . Erectile dysfunction 09/26/2017  . FOLLICULITIS 1/60/1093   Qualifier: Diagnosis of  By: Magdalen Spatz NP, Romona Curls    . Glossitis 10/03/2010   Qualifier: Diagnosis of  By: Magdalen Spatz NP, Romona Curls    . Hemorrhoid 09/29/2012  . HIV infection ([place])   . Inflamed seborrheic keratosis 10/11/2010   Qualifier: Diagnosis of  By: Magdalen Spatz NP, Romona Curls    . Left shoulder pain 03/17/2015  . Nonallopathic lesion of cervical region 04/14/2015  . Nonallopathic lesion of lumbosacral region 04/14/2015  . Nonallopathic lesion of thoracic region 04/14/2015  . Other specified disorder of rectum and anus 10/03/2010   Qualifier: Diagnosis of  By: Magdalen Spatz NP, Romona Curls    . OTITIS EXTERNA 06/14/2010   Qualifier: Diagnosis of  By: Tomma Lightning MD, Claiborne Billings    . Pruritus ani 10/24/2010   Qualifier: Diagnosis of  By: Magdalen Spatz NP, Romona Curls    . Racing heart beat 04/21/2015  . Scapular dyskinesis 03/24/2015  . Seasonal allergies 01/27/2014  . Sprain of acromioclavicular joint 03/17/2015  . STD exposure 09/26/2017  . Substance abuse ([place]) 01/27/2014  . Substance abuse in remission ([place])   . Testicular/scrotal pain 04/21/2015  . Tobacco abuse 03/22/2015  . URI 10/31/2010   Qualifier: Diagnosis of  By: Magdalen Spatz NP,  Romona Curls    . 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 Cancer Maternal Grandmother   . Tremor Father   . Eating disorder Sister   . Depression Sister   . Tremor Paternal Uncle   . Tremor Paternal Grandfather   . Eating disorder Sister   . Eating disorder Sister   . Arthritis Neg Hx   . Lupus Neg Hx   . Rosacea Neg Hx     Social History   Socioeconomic History  . Marital status: Single    Spouse name: Not on file  . Number of children: 0  . Years of education: 77  . Highest education level: Not on file  Occupational History  . Occupation: Server  Tobacco Use  . Smoking status: Former Smoker    Packs/day: 0.75    Years: 25.00    Pack years: 18.75    Types: Cigarettes    Quit date: 03/04/2016    Years since quitting: 4.0  . Smokeless tobacco: Never Used  Vaping Use  . Vaping Use: Every day  . Substances: Nicotine  Substance and  Sexual Activity  . 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 Sexual activity: Yes    Partners: Male    Birth control/protection: Condom  Other Topics Concern  . Not on file  Social History Narrative   Fun: Yoga, hiking, going to the gym.    Social Determinants of Health   Financial Resource Strain:   . Difficulty of Paying Living Expenses:   Food Insecurity:   . Worried About Programme researcher, broadcasting/film/video in the Last Year:   . Barista in the Last Year:   Transportation Needs:   . Freight forwarder (Medical):   Marland Kitchen Lack of Transportation (Non-Medical):   Physical Activity:   . Days of Exercise per Week:   . Minutes of Exercise per Session:   Stress:   . Feeling of Stress :   Social Connections:   . Frequency of Communication with Friends and Family:   . Frequency of Social Gatherings with Friends and Family:   . Attends Religious Services:   . Active  Member of Clubs or Organizations:   . Attends Banker Meetings:   Marland Kitchen Marital Status:   Intimate Partner Violence:   . Fear of Current or Ex-Partner:   . Emotionally Abused:   Marland Kitchen Physically Abused:   . Sexually Abused:     Past Medical History, Surgical history, Social history, and Family history were reviewed and updated as appropriate.   Please see review of systems for further details on the patient's review from today.   Objective:   Physical Exam:  There were no vitals taken for this visit.  Physical Exam Constitutional:      General: He is not in acute distress. Musculoskeletal:        General: No deformity.  Neurological:     Mental Status: He is alert and oriented to person, place, and time.     Coordination: Coordination normal.  Psychiatric:        Attention and Perception: Attention and perception normal. He does not perceive auditory or visual hallucinations.        Mood and Affect: Mood is anxious and depressed. Affect is not labile, blunt, angry or inappropriate.        Speech: Speech normal.        Behavior: Behavior normal.        Thought Content: Thought content normal. Thought content is not paranoid or delusional. Thought content does not include homicidal or suicidal ideation. Thought content does not include homicidal or suicidal plan.        Cognition and Memory: Cognition and memory normal.        Judgment: Judgment normal.     Comments: Insight intact     Lab Review:     Component Value Date/Time   NA 136 03/16/2020 1404   K 4.3 03/16/2020 1404   CL 101 03/16/2020 1404   CO2 28 03/16/2020 1404   GLUCOSE 116 (H) 03/16/2020 1404   GLUCOSE 76 02/28/2009 0000   BUN 11 03/16/2020 1404   CREATININE 0.82 03/16/2020 1404   CALCIUM 9.4 03/16/2020 1404   PROT 6.6 03/16/2020 1404   ALBUMIN 4.6 05/29/2016 1441   AST 18 03/16/2020 1404   ALT 28 03/16/2020 1404   ALKPHOS 70 05/29/2016 1441   BILITOT 0.3 03/16/2020 1404   GFRNONAA 95  08/31/2019 1436   GFRAA  110 08/31/2019 1436       Component Value Date/Time   WBC 5.2 03/16/2020 1404   RBC 4.49 03/16/2020 1404   HGB 14.4 03/16/2020 1404   HCT 43.0 03/16/2020 1404   PLT 291 03/16/2020 1404   MCV 95.8 03/16/2020 1404   MCH 32.1 03/16/2020 1404   MCHC 33.5 03/16/2020 1404   RDW 13.2 03/16/2020 1404   LYMPHSABS 2,496 03/16/2020 1404   MONOABS 360 05/29/2016 1441   EOSABS 120 03/16/2020 1404   BASOSABS 21 03/16/2020 1404    No results found for: POCLITH, LITHIUM   No results found for: PHENYTOIN, PHENOBARB, VALPROATE, CBMZ   .res Assessment: Plan:   Pt seen for 30 minutes and time spent counseling pt re: treatment options. Discussed strategies to possibly improve nausea, to include dividing Sertraline doses to BID to minimize increase in blood levels if Sertraline is potentially causing or contributing to nausea. Discussed that nausea may also be due to anxiety and Remeron may be helpful for anxiety and nausea.  Discussed pharmacogenetic testing and he consents for testing and saliva sample collected at time of exam.  Agreed not to add any new medication until results of pharmacogenetic testing are available and will discuss results and implications for possible treatment options at next visit.  Pt to f/u in 1-2 weeks or sooner if clinically indicated.  Patient advised to contact office with any questions, adverse effects, or acute worsening in signs and symptoms.  Osiel was seen today for depression and anxiety.  Diagnoses and all orders for this visit:  PTSD (post-traumatic stress disorder) -     sertraline (ZOLOFT) 50 MG tablet; Take 1 tablet (50 mg total) by mouth in the morning and at bedtime.  Depression, unspecified depression type -     sertraline (ZOLOFT) 50 MG tablet; Take 1 tablet (50 mg total) by mouth in the morning and at bedtime.     Please see After Visit Summary for patient specific instructions.  Future Appointments  Date Time  Provider Department Center  04/12/2020 11:30 AM Blanchard Kelch, NP [facility]  04/15/2020 12:30 PM Corie Chiquito, PMHNP [facility] None  05/03/2020 12:00 PM May, [place], [facility] None  06/07/2020  4:00 PM May, [place], [facility] None    No orders of the defined types were placed in this encounter.   -------------------------------

## 2020-04-06 NOTE — Progress Notes (Signed)
[facility] Counselor/Therapist Progress Note  Patient ID: Yigit Norkus, MRN: [id number],    Date: 04/06/2020  Time Spent: 57 minutes   Treatment Type: Individual Therapy   Reported Symptoms: Lack of motivation, anhedonia, social withdrawal, sad, thoughts of death.   Mental Status Exam:  Appearance:   Casual     Behavior:  Appropriate  Motor:  Normal  Speech/Language:   Clear and Coherent  Affect:  Depressed and Flat  Mood:  anxious, depressed, irritable and sad  Thought process:  normal  Thought content:    WNL  Sensory/Perceptual disturbances:    WNL  Orientation:  oriented to person, place, time/date and situation  Attention:  Good  Concentration:  Good  Memory:  WNL  Fund of knowledge:   Good  Insight:    Good  Judgment:   Good  Impulse Control:  Good   Risk Assessment: Danger to Self:  No Self-injurious Behavior: No Danger to Others: No Duty to Warn:no Physical Aggression / Violence:No  Access to Firearms a concern: No  Gang Involvement:No   Subjective: The client states that he did go to [employer], [place].  "I felt intensely alone."  The client states that he has tapered off of the Trintellix and onto Zoloft.  He finds that he is very nauseous every morning.  I suggested to the client that maybe he take Zoloft in the evening if it was causing the nausea.  He agreed to do so.  He will also see Corie Chiquito, NP tomorrow and discuss this with her.  The client asked about gene testing for the medications.  I explained to the client that we do offer that but Corie Chiquito, NP would be able to do that tomorrow. The client states that his mood is worse in the morning.  He is experiencing severe anhedonia.  He notes as he goes through his day that his mood does improve.  He goes to a local kava bar and has a beverage with kratom in it.  I explained to the client that I did not have any information about kratom but was concerned that it might impact his mood.   The client was very irritable to the course of the session.  He is having difficulty being able to focus and work.  He states his sister came into town 2 weekends in a row.  He found it helpful. I discussed with the client that he really is in a very different place that he ever has been.  He has completed his masters program in social work.  He is out of a relationship.  And he also does not have much in the way of community.  I pointed out to the client that when these things changed his mood would also start to improve.  I did feel that his biochemistry was off and to stay with his current medication.  The client agreed to do so.  I encouraged the client to take a omega-3 fatty acid every day along with a good B vitamin with methyl folate.  I gave the client handout to explain this as well.   Interventions: Mindfulness Meditation, Motivational Interviewing, Solution-Oriented/Positive Psychology, [employer] Desensitization and Reprocessing (EMDR) and Insight-Oriented  Diagnosis:   ICD-10-CM   1. Major depressive disorder, recurrent episode, moderate (HCC)  F33.1     Plan: Mood dependent behavior, positive self talk, self-care, review handout vitamins for mood, assertiveness, boundaries.  Gelene Mink Damira Kem, [facility]

## 2020-04-11 ENCOUNTER — Telehealth: Payer: Self-pay | Admitting: Psychiatry

## 2020-04-11 DIAGNOSIS — F32A Depression, unspecified: Secondary | ICD-10-CM

## 2020-04-11 DIAGNOSIS — F4310 Post-traumatic stress disorder, unspecified: Secondary | ICD-10-CM

## 2020-04-11 MED ORDER — SERTRALINE HCL 50 MG PO TABS: ORAL_TABLET | ORAL | 1 refills | Status: DC

## 2020-04-11 NOTE — Telephone Encounter (Signed)
Talked with pt via phone to f/u after last visit. He reports that he continues to have nausea and that it is not as severe. He reports that nausea is always in the morning. He reports that he is not having nausea every morning. He reports that his anxiety has been severe. He reports awakening with muscle tension in his back and jaw from clinching his teeth. He awakens with a feeling of dread and anxious thoughts about the upcoming day. He reports that he has not been having full blown panic attacks, however frequently feels on the verge of panic. He reports that anxiety has been worse. He reports that depression remains severe. He reports he has some moments where he feels "ok." Able to work minimally. Denies SI.   Slept 11 hours last night. Awakens around 8:30 am to take medication. He reports that one day he did not feel nauseous until after taking the medication.   Plan: Will increase Sertraline to 50 mg po q am and 75 mg po q evening.   Patient advised to contact office with any questions, adverse effects, or acute worsening in signs and symptoms.

## 2020-04-12 ENCOUNTER — Other Ambulatory Visit: Payer: Self-pay

## 2020-04-12 ENCOUNTER — Telehealth (INDEPENDENT_AMBULATORY_CARE_PROVIDER_SITE_OTHER): Payer: BC Managed Care – PPO | Admitting: Infectious Diseases

## 2020-04-12 DIAGNOSIS — F332 Major depressive disorder, recurrent severe without psychotic features: Secondary | ICD-10-CM

## 2020-04-12 DIAGNOSIS — R6882 Decreased libido: Secondary | ICD-10-CM

## 2020-04-12 DIAGNOSIS — B20 Human immunodeficiency virus [HIV] disease: Secondary | ICD-10-CM | POA: Diagnosis not present

## 2020-04-12 NOTE — Assessment & Plan Note (Signed)
Referral to urology in process - appt in about a month for assistance in work up / management.

## 2020-04-12 NOTE — Progress Notes (Addendum)
Name: Frank Webster   DOB: 10-Dec-1975   MRN: [id number]   PCP: Renford Dills, MD   Virtual Visit Encounter:  I connected with Frank Webster on 04/12/20 at 11:30 AM EDT by VIDEO and verified that I am speaking with the correct person using two identifiers.   I discussed the limitations, risks, security and privacy concerns of performing an evaluation and management service by virtual service and the availability of in person appointments. I also discussed with the patient that there may be a patient responsible charge related to this service. The patient expressed understanding and agreed to proceed.  Patient location: residence in [place] Provider Location: [facility]  SUBJECTIVE: Brief Narrative:  Frank Webster is a 44 y.o. male with HIV disease. Has seen Dr. Luciana Axe in the clinic and previously well controlled with low level viremia < 100 copies the last few draws. Diagnosed >22 years ago; had a time where he struggled with adherence 2/2 drug use. OI Hx: none.  HIV Risk: MSM, previous drug use.   Previous Regimens:   Genvoya    Triumeq + Tivicay PM (DDI with Trileptel)  Triumeq >> suppressed    Genotype:   01/2014 - no INSTI resistance, wild type   Patient Active Problem List   Diagnosis Date Noted  . Anxiety state 04/05/2016  . Decreased libido 04/05/2016  . Hyperlipidemia 04/21/2015  . Health care maintenance 02/15/2015  . Screening examination for venereal disease 09/30/2014  . Seasonal allergies 01/27/2014  . Hemorrhoid 09/29/2012  . Depression, major (HCC) 10/31/2010  . Human immunodeficiency virus (HIV) disease (HCC) 02/23/2010    CC:  Routine HIV follow up care.    HPI: He continues to do do well on Triumeq and misses no doses. No trouble with any side effects.   Awaiting appointment with Urology for ED management. Appt in 1 month.   He has had severe depression symptoms with suicide intentions lately due to a rapid change in medications because of  insurance dropping coverage. He is working closely with psychiatry/counseling team outpatient. Considering a leave of absences but has some flexibility if he needs off intermittently which helps. He is now 9 sessions in to TMS therapy for refractory depression. Considering ECT if this does not help.    Review of Systems  Constitutional: Negative for chills, fever, malaise/fatigue and weight loss.  HENT: Negative for sore throat.        No dental problems  Respiratory: Negative for cough and sputum production.   Cardiovascular: Negative for chest pain and leg swelling.  Gastrointestinal: Negative for abdominal pain, diarrhea and vomiting.  Genitourinary: Negative for dysuria and flank pain.  Musculoskeletal: Negative for joint pain, myalgias and neck pain.  Skin: Negative for rash.  Neurological: Negative for dizziness, tingling and headaches.  Psychiatric/Behavioral: Positive for depression. Negative for substance abuse and suicidal ideas. The patient is not nervous/anxious and does not have insomnia.     Past Medical History:  Diagnosis Date  . Anxiety state 04/05/2016  . CANDIDIASIS, ORAL 10/11/2010   Qualifier: Diagnosis of  By: [name] NP, Malvin Johns    . Decreased libido 04/05/2016  . Depression, major (HCC) 10/31/2010   Qualifier: Diagnosis of  By: [name] NP, Malvin Johns    . Dermatophytosis of unspecified site 10/24/2010   Qualifier: Diagnosis of  By: [name] NP, Malvin Johns    . Erectile dysfunction 09/26/2017  . FOLLICULITIS 10/24/2010   Qualifier: Diagnosis of  By: [name] NP, Malvin Johns    . Glossitis 10/03/2010   Qualifier: Diagnosis  of  By: [name] NP, Malvin Johns    . Hemorrhoid 09/29/2012  . HIV infection (HCC)   . Inflamed seborrheic keratosis 10/11/2010   Qualifier: Diagnosis of  By: [name] NP, Malvin Johns    . Left shoulder pain 03/17/2015  . Nonallopathic lesion of cervical region 04/14/2015  . Nonallopathic lesion of lumbosacral region 04/14/2015  . Nonallopathic  lesion of thoracic region 04/14/2015  . Other specified disorder of rectum and anus 10/03/2010   Qualifier: Diagnosis of  By: [name] NP, Malvin Johns    . OTITIS EXTERNA 06/14/2010   Qualifier: Diagnosis of  By: Philipp Deputy MD, Tresa Endo    . Pruritus ani 10/24/2010   Qualifier: Diagnosis of  By: [name] NP, Malvin Johns    . Racing heart beat 04/21/2015  . Scapular dyskinesis 03/24/2015  . Seasonal allergies 01/27/2014  . Sprain of acromioclavicular joint 03/17/2015  . STD exposure 09/26/2017  . Substance abuse (HCC) 01/27/2014  . Substance abuse in remission (HCC)   . Testicular/scrotal pain 04/21/2015  . Tobacco abuse 03/22/2015  . URI 10/31/2010   Qualifier: Diagnosis of  By: [name] NP, Malvin Johns    . 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 cetirizine (ZYRTEC) 10 MG tablet Take 10 mg by mouth daily.    . cholecalciferol (VITAMIN D3) 25 MCG (1000 UNIT) tablet Take 2,000 Units by mouth daily.    Marland Kitchen ibuprofen (ADVIL) 600 MG tablet Take 1 tablet (600 mg total) by mouth every 6 (six) hours as needed for moderate pain. 30 tablet 0  . Multiple Vitamins-Minerals (CENTRUM MEN PO) Take by mouth.    . rosuvastatin (CRESTOR) 10 MG tablet TAKE 1 TABLET(10 MG) BY MOUTH DAILY 30 tablet 5  . sertraline (ZOLOFT) 50 MG tablet Take 1 tab in the morning and 1.5 tabs in the evening 60 tablet 1  . TRIUMEQ 600-50-300 MG tablet TAKE ONE TABLET BY MOUTH ONCE DAILY. STORE IN ORIGINAL BOTTLE AT ROOM TEMPERATURE. 30 tablet 3  . fluticasone (FLONASE) 50 MCG/ACT nasal spray Place 2 sprays into both nostrils daily. (Patient taking differently: Place 2 sprays into both nostrils as needed for allergies. ) 16 g 0  . lamoTRIgine (LAMICTAL) 200 MG tablet Take 1 tablet (200 mg total) by mouth daily. 30 tablet 2  . Probiotic Product (PROBIOTIC PO) Take by mouth. (Patient not taking: Reported on  04/12/2020)     No facility-administered medications prior to visit.     Social History   Tobacco Use  . Smoking status: Former Smoker    Packs/day: 0.75    Years: 25.00    Pack years: 18.75    Types: Cigarettes    Quit date: 03/04/2016    Years since quitting: 4.1  . Smokeless tobacco: Never Used  Vaping Use  . Vaping Use: Every day  . Substances: Nicotine  Substance Use Topics  . 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 Drug use: No    Comment: in recovery   Social History   Substance and Sexual Activity  Sexual Activity Yes  . Partners: Male  . Birth control/protection: Condom    Objective:  There were no vitals filed for this visit. There is no height or weight on file to calculate BMI.   Physical Exam HENT:     Head: Normocephalic.  Eyes:  General: No scleral icterus. Pulmonary:     Effort: Pulmonary effort is normal.  Neurological:     Mental Status: He is alert and oriented to person, place, and time.  Psychiatric:        Mood and Affect: Mood and affect normal.        Cognition and Memory: Memory normal.     Lab Results Lab Results  Component Value Date   WBC 5.2 03/16/2020   HGB 14.4 03/16/2020   HCT 43.0 03/16/2020   MCV 95.8 03/16/2020   PLT 291 03/16/2020    Lab Results  Component Value Date   CREATININE 0.82 03/16/2020   BUN 11 03/16/2020   NA 136 03/16/2020   K 4.3 03/16/2020   CL 101 03/16/2020   CO2 28 03/16/2020    Lab Results  Component Value Date   ALT 28 03/16/2020   AST 18 03/16/2020   ALKPHOS 70 05/29/2016   BILITOT 0.3 03/16/2020    Lab Results  Component Value Date   CHOL 155 08/31/2019   HDL 36 (L) 08/31/2019   LDLCALC 94 08/31/2019   TRIG 148 08/31/2019   CHOLHDL 4.3 08/31/2019   HIV 1 RNA Quant (copies/mL)  Date Value  03/16/2020 <20 DETECTED (A)  08/31/2019 <20 DETECTED (A)  12/22/2018 56 (H)   CD4 T Cell Abs (/uL)  Date Value  03/16/2020 1,152   08/31/2019 844  12/22/2018 1,260   Lab Results  Component Value Date   HAV NEG 02/24/2010   Lab Results  Component Value Date   HEPBSAG NEG 02/24/2010   HEPBSAB INDETER (A) 02/24/2010   Lab Results  Component Value Date   HCVAB NEGATIVE 09/14/2014   Lab Results  Component Value Date   CHLAMYDIAWP Negative 04/09/2019   CHLAMYDIAWP Negative 04/09/2019   N Negative 04/09/2019   N Negative 04/09/2019     Problem List Items Addressed This Visit      Unprioritized   Human immunodeficiency virus (HIV) disease (HCC) (Chronic)    He has had durable viral suppression with full, healthy reconstitution of CD4 count. Continues to tolerate Triumeq well and taking it correctly. No drug interactions identified.  Will have him continue regimen with routine follow up again in 6 months.       Depression, major (HCC)    Unstable due to recent change in medications with insurance changes. In care with psychiatry and considering ECT if current stimulator therapy fails. I am hopeful he will see some benefit. Emotional support provided today.       Decreased libido    Referral to urology in process - appt in about a month for assistance in work up / management.          Follow Up Instructions: RTC 46m with labs prior to visit.  Urology referral  Ongoing care with psych team  Continue Triumeq Vaccines up to date including COVID Moderna series    I discussed the assessment and treatment plan with the patient. The patient was provided an opportunity to ask questions and all were answered. The patient agreed with the plan and demonstrated an understanding of the instructions.   The patient was advised to call back or seek an in-person evaluation if the symptoms worsen or if the condition fails to improve as anticipated.    Rexene Alberts, MSN, NP-C [facility] for Infectious Disease [facility]  [email or web address] Pager: [phone] Office:  [phone] [facility] Main Line: [phone]   04/12/2020  12:50 PM

## 2020-04-12 NOTE — Assessment & Plan Note (Addendum)
Unstable due to recent change in medications with insurance changes. In care with psychiatry and considering ECT if current stimulator therapy fails. I am hopeful he will see some benefit. Emotional support provided today.

## 2020-04-12 NOTE — Assessment & Plan Note (Signed)
He has had durable viral suppression with full, healthy reconstitution of CD4 count. Continues to tolerate Triumeq well and taking it correctly. No drug interactions identified.  Will have him continue regimen with routine follow up again in 6 months.

## 2020-04-15 ENCOUNTER — Other Ambulatory Visit: Payer: Self-pay

## 2020-04-15 ENCOUNTER — Ambulatory Visit (INDEPENDENT_AMBULATORY_CARE_PROVIDER_SITE_OTHER): Payer: BC Managed Care – PPO | Admitting: Psychiatry

## 2020-04-15 ENCOUNTER — Encounter: Payer: Self-pay | Admitting: Psychiatry

## 2020-04-15 DIAGNOSIS — F4310 Post-traumatic stress disorder, unspecified: Secondary | ICD-10-CM | POA: Diagnosis not present

## 2020-04-15 DIAGNOSIS — E7212 Methylenetetrahydrofolate reductase deficiency: Secondary | ICD-10-CM | POA: Diagnosis not present

## 2020-04-15 DIAGNOSIS — Z1589 Genetic susceptibility to other disease: Secondary | ICD-10-CM

## 2020-04-15 DIAGNOSIS — F39 Unspecified mood [affective] disorder: Secondary | ICD-10-CM | POA: Diagnosis not present

## 2020-04-15 MED ORDER — DEPLIN 15 15-90.314 MG PO CAPS: 15.0000 mg | ORAL_CAPSULE | Freq: Every day | ORAL | 0 refills | Status: DC

## 2020-04-15 MED ORDER — LAMOTRIGINE 200 MG PO TABS: ORAL_TABLET | ORAL | 1 refills | Status: DC

## 2020-04-15 MED ORDER — LITHIUM CARBONATE 150 MG PO CAPS: ORAL_CAPSULE | ORAL | 1 refills | Status: DC

## 2020-04-15 NOTE — Progress Notes (Signed)
Frank Webster [id number] 1976/08/14 44 y.o.  Subjective:   Patient ID:  Frank Webster is a 44 y.o. (DOB 09-09-1976) male.  Chief Complaint:  Chief Complaint  Patient presents with  . Anxiety  . Depression    HPI Frank Webster presents to the office today for follow-up of anxiety and depression. He reports, "I'm starting to feel different. Like I am turning a corner." He reports that there are moments that he sees some "light." Anxiety remains high and awakens with feelings of fear and dread. He notices tension in his jaw and pain in his stomach, shakiness, he feels on the verge of panic. Denies full blown panic attacks. He reports feeling depressed "but not the depths that I was feeling before." Denies irritability. Sleeping 10 hours a night. Slight improvement in energy. Motivation remains low. Concentration has been poor and is slightly improved. Denies SI.   Has nausea has still been occurring, mostly in the morning. He reports that it may be anxiety related. No appetite until later in the day.   Denies impulsive or risky behavior.  Going to TMS treatments.   Able to do some work but reduced amount.   Past medication trials: Prozac-vomiting Celexa Cymbalta Viibryd-initially helpful, then no longer as effective Trintellix- Worsening depression, vague SI BuSpar- Has been helpful for anxiety Wellbutrin Lamictal Trileptal Trazodone Doxazosin Clonidine Trazodone-excessive somnolence, vivid dreams Doxepin-ineffective for insomnia Belsomra- adverse reaction Lunesta- Helped for about 2 nights and then no longer effective. Klonopin- Effective for insomnia. Hydroxyzine- ineffective. Melatonin-Some benefit but does not sleep throughout the night. Gabapentin Klonopin Abilify- helped initially and then was no longer effective. Experienced wt gain.  PHQ2-9     Video Visit from 04/12/2020 in [facility] for Infectious Disease Video Visit from  09/23/2019 in [facility] for Infectious Disease Office Visit from 04/09/2019 in [facility] for Infectious Disease Office Visit from 09/22/2018 in [facility] for Infectious Disease Office Visit from 09/26/2017 in [facility] for Infectious Disease  PHQ-2 Total Score 5 0 2 0 0  PHQ-9 Total Score -- -- 3 -- --       Review of Systems:  Review of Systems  Gastrointestinal: Positive for abdominal pain, diarrhea and nausea. Negative for vomiting.  Musculoskeletal: Negative for gait problem.  Neurological: Positive for tremors.  Psychiatric/Behavioral:       Please refer to HPI    Medications: I have reviewed the patient's current medications.  Current Outpatient Medications  Medication Sig Dispense Refill  . cetirizine (ZYRTEC) 10 MG tablet Take 10 mg by mouth daily.    . cholecalciferol (VITAMIN D3) 25 MCG (1000 UNIT) tablet Take 2,000 Units by mouth daily.    . fluticasone (FLONASE) 50 MCG/ACT nasal spray Place 2 sprays into both nostrils daily. (Patient taking differently: Place 2 sprays into both nostrils as needed for allergies. ) 16 g 0  . ibuprofen (ADVIL) 600 MG tablet Take 1 tablet (600 mg total) by mouth every 6 (six) hours as needed for moderate pain. 30 tablet 0  . L-Methylfolate-Algae (DEPLIN 15) 15-90.314 MG CAPS Take 15 mg by mouth daily. 30 capsule 0  . lamoTRIgine (LAMICTAL) 200 MG tablet Take 1.5 tablets (300 mg total) by mouth daily for 14 days, THEN 2 tablets (400 mg total) daily for 16 days. 60 tablet 1  . lithium carbonate 150 MG capsule Take 1 capsule po QHS x 3-5 days, then increase to 2 capsules po QHS 60 capsule  1  . Multiple Vitamins-Minerals (CENTRUM MEN PO) Take by mouth.    . Probiotic Product (PROBIOTIC PO) Take by mouth. (Patient not taking: Reported on 04/12/2020)    . rosuvastatin (CRESTOR) 10 MG tablet TAKE 1 TABLET(10 MG) BY MOUTH DAILY 30 tablet 5  . sertraline (ZOLOFT) 50 MG tablet Take 1 tab in  the morning and 1.5 tabs in the evening 60 tablet 1  . TRIUMEQ 600-50-300 MG tablet TAKE ONE TABLET BY MOUTH ONCE DAILY. STORE IN ORIGINAL BOTTLE AT ROOM TEMPERATURE. 30 tablet 3   No current facility-administered medications for this visit.    Medication Side Effects: Other: possible nausea  Allergies: No Known Allergies  Past Medical History:  Diagnosis Date  . Anxiety state 04/05/2016  . CANDIDIASIS, ORAL 10/11/2010   Qualifier: Diagnosis of  By: Sundra AlandFarrington NP, Malvin JohnsBradford    . Decreased libido 04/05/2016  . Depression, major (HCC) 10/31/2010   Qualifier: Diagnosis of  By: Sundra AlandFarrington NP, Malvin JohnsBradford    . Dermatophytosis of unspecified site 10/24/2010   Qualifier: Diagnosis of  By: Sundra AlandFarrington NP, Malvin JohnsBradford    . Erectile dysfunction 09/26/2017  . FOLLICULITIS 10/24/2010   Qualifier: Diagnosis of  By: Sundra AlandFarrington NP, Malvin JohnsBradford    . Glossitis 10/03/2010   Qualifier: Diagnosis of  By: Sundra AlandFarrington NP, Malvin JohnsBradford    . Hemorrhoid 09/29/2012  . HIV infection (HCC)   . Inflamed seborrheic keratosis 10/11/2010   Qualifier: Diagnosis of  By: Sundra AlandFarrington NP, Malvin JohnsBradford    . Left shoulder pain 03/17/2015  . Nonallopathic lesion of cervical region 04/14/2015  . Nonallopathic lesion of lumbosacral region 04/14/2015  . Nonallopathic lesion of thoracic region 04/14/2015  . Other specified disorder of rectum and anus 10/03/2010   Qualifier: Diagnosis of  By: Sundra AlandFarrington NP, Malvin JohnsBradford    . OTITIS EXTERNA 06/14/2010   Qualifier: Diagnosis of  By: Philipp DeputyVollmer MD, Tresa EndoKelly    . Pruritus ani 10/24/2010   Qualifier: Diagnosis of  By: Sundra AlandFarrington NP, Malvin JohnsBradford    . Racing heart beat 04/21/2015  . Scapular dyskinesis 03/24/2015  . Seasonal allergies 01/27/2014  . Sprain of acromioclavicular joint 03/17/2015  . STD exposure 09/26/2017  . Substance abuse (HCC) 01/27/2014  . Substance abuse in remission (HCC)   . Testicular/scrotal pain 04/21/2015  . Tobacco abuse 03/22/2015  . URI 10/31/2010   Qualifier: Diagnosis of  By: Sundra AlandFarrington NP,  Malvin JohnsBradford    . 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 Cancer Maternal Grandmother   . Tremor Father   . Eating disorder Sister   . Depression Sister   . Tremor Paternal Uncle   . Tremor Paternal Grandfather   . Eating disorder Sister   . Eating disorder Sister   . Arthritis Neg Hx   . Lupus Neg Hx   . Rosacea Neg Hx     Social History   Socioeconomic History  . Marital status: Single    Spouse name: Not on file  . Number of children: 0  . Years of education: 916  . Highest education level: Not on file  Occupational History  . Occupation: Server  Tobacco Use  . Smoking status: Former Smoker    Packs/day: 0.75    Years: 25.00    Pack years: 18.75    Types: Cigarettes    Quit date: 03/04/2016    Years since quitting: 4.1  . Smokeless tobacco: Never Used  Vaping Use  .  Vaping Use: Every day  . Substances: Nicotine  Substance and Sexual Activity  . 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 Sexual activity: Yes    Partners: Male    Birth control/protection: Condom  Other Topics Concern  . Not on file  Social History Narrative   Fun: Yoga, hiking, going to the gym.    Social Determinants of Health   Financial Resource Strain:   . Difficulty of Paying Living Expenses:   Food Insecurity:   . Worried About Programme researcher, broadcasting/film/video in the Last Year:   . Barista in the Last Year:   Transportation Needs:   . Freight forwarder (Medical):   Marland Kitchen Lack of Transportation (Non-Medical):   Physical Activity:   . Days of Exercise per Week:   . Minutes of Exercise per Session:   Stress:   . Feeling of Stress :   Social Connections:   . Frequency of Communication with Friends and Family:   . Frequency of Social Gatherings with Friends and Family:   . Attends Religious Services:   . Active  Member of Clubs or Organizations:   . Attends Banker Meetings:   Marland Kitchen Marital Status:   Intimate Partner Violence:   . Fear of Current or Ex-Partner:   . Emotionally Abused:   Marland Kitchen Physically Abused:   . Sexually Abused:     Past Medical History, Surgical history, Social history, and Family history were reviewed and updated as appropriate.   Please see review of systems for further details on the patient's review from today.   Objective:   Physical Exam:  There were no vitals taken for this visit.  Physical Exam Constitutional:      General: He is not in acute distress. Musculoskeletal:        General: No deformity.  Neurological:     Mental Status: He is alert and oriented to person, place, and time.     Coordination: Coordination normal.  Psychiatric:        Attention and Perception: Attention and perception normal. He does not perceive auditory or visual hallucinations.        Mood and Affect: Mood is anxious and depressed. Affect is not labile, blunt, angry or inappropriate.        Speech: Speech normal.        Behavior: Behavior normal.        Thought Content: Thought content normal. Thought content is not paranoid or delusional. Thought content does not include homicidal or suicidal ideation. Thought content does not include homicidal or suicidal plan.        Cognition and Memory: Cognition and memory normal.        Judgment: Judgment normal.     Comments: Insight intact     Lab Review:     Component Value Date/Time   NA 136 03/16/2020 1404   K 4.3 03/16/2020 1404   CL 101 03/16/2020 1404   CO2 28 03/16/2020 1404   GLUCOSE 116 (H) 03/16/2020 1404   GLUCOSE 76 02/28/2009 0000   BUN 11 03/16/2020 1404   CREATININE 0.82 03/16/2020 1404   CALCIUM 9.4 03/16/2020 1404   PROT 6.6 03/16/2020 1404   ALBUMIN 4.6 05/29/2016 1441   AST 18 03/16/2020 1404   ALT 28 03/16/2020 1404   ALKPHOS 70 05/29/2016 1441   BILITOT 0.3 03/16/2020  1404   GFRNONAA 95  08/31/2019 1436   GFRAA 110 08/31/2019 1436       Component Value Date/Time   WBC 5.2 03/16/2020 1404   RBC 4.49 03/16/2020 1404   HGB 14.4 03/16/2020 1404   HCT 43.0 03/16/2020 1404   PLT 291 03/16/2020 1404   MCV 95.8 03/16/2020 1404   MCH 32.1 03/16/2020 1404   MCHC 33.5 03/16/2020 1404   RDW 13.2 03/16/2020 1404   LYMPHSABS 2,496 03/16/2020 1404   MONOABS 360 05/29/2016 1441   EOSABS 120 03/16/2020 1404   BASOSABS 21 03/16/2020 1404    No results found for: POCLITH, LITHIUM   No results found for: PHENYTOIN, PHENOBARB, VALPROATE, CBMZ   .res Assessment: Plan:   Patient seen for 30 minutes and time spent reviewing results of pharmacogenetics testing and discussing implications for treatment.  Case was also staffed with Dr. Jennelle Human.  Discussed that results indicate that he is a rapid metabolizer of lamotrigine and will therefore increase Lamictal to 300 mg po qd for 2 weeks, then increase to 400 mg daily to possibly improve mood and anxiety. Discussed potential benefits, risks, and side effects of lithium and discussed that low-dose lithium may also be helpful for his depression and also with cycles of sudden worsening in mood signs and symptoms. Discussed that pharmacogenetics testing also indicated that he is heterozygous for MTHFR deficiency and may benefit from l-methylfolate.  Will start Deplin samples 15 mg daily for augmentation of depression. Will continue sertraline without changes. Recommend continuing TMS treatments per TMS providers recommendation. Recommend continuing psychotherapy with Sherron Monday, LC [facility]. Patient to follow-up with this provider in 4 weeks or sooner if clinically indicated. Patient advised to contact office with any questions, adverse effects, or acute worsening in signs and symptoms.  Hakan was seen today for anxiety and depression.  Diagnoses and all orders for this visit:  Mood disorder (HCC) -     lithium carbonate 150 MG capsule; Take 1  capsule po QHS x 3-5 days, then increase to 2 capsules po QHS -     lamoTRIgine (LAMICTAL) 200 MG tablet; Take 1.5 tablets (300 mg total) by mouth daily for 14 days, THEN 2 tablets (400 mg total) daily for 16 days.  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  PTSD (post-traumatic stress disorder)     Please see After Visit Summary for patient specific instructions.  Future Appointments  Date Time Provider Department Center  05/03/2020 12:00 PM May, Frederick, [facility] CP-CP None  05/13/2020  3:30 PM Corie Chiquito, PMHNP CP-CP None  06/07/2020  4:00 PM May, Frederick, [facility] CP-CP None    No orders of the defined types were placed in this encounter.   -------------------------------

## 2020-04-22 ENCOUNTER — Telehealth: Payer: Self-pay | Admitting: Psychiatry

## 2020-04-22 NOTE — Telephone Encounter (Signed)
It looks like the lithium was just started and he should only be on 300 mg now.  Have him stop it completely or if he feels uncomfortable doing that until this is discussed with Shanda Bumps, he could decrease back to 150 mg, 1 p.o. nightly.

## 2020-04-22 NOTE — Telephone Encounter (Signed)
Frank Webster just called to report that the increase in his medications is causing high anxiety, severe panic attacks (3 in last 1 1/2 weeks), and tremors.  He can't even walk sometimes because his legs are shaking so much.  Can hardly function at all.  Others have told him various medications that may help with this. Perhaps he needs to change is doses of medication.  He needs something to happen asap.  He is just miserable.  Please call with options.

## 2020-04-25 ENCOUNTER — Telehealth: Payer: Self-pay | Admitting: Psychiatry

## 2020-04-25 DIAGNOSIS — F410 Panic disorder [episodic paroxysmal anxiety] without agoraphobia: Secondary | ICD-10-CM

## 2020-04-25 DIAGNOSIS — F5101 Primary insomnia: Secondary | ICD-10-CM

## 2020-04-25 NOTE — Telephone Encounter (Signed)
Pt lm stating he recently had a med change. Due to this he has experience severe anxiety and had multiple panic attacks for the last week or so. Please advise.

## 2020-04-26 MED ORDER — PROPRANOLOL HCL 10 MG PO TABS: ORAL_TABLET | ORAL | 1 refills | Status: DC

## 2020-04-27 MED ORDER — GABAPENTIN 400 MG PO CAPS: ORAL_CAPSULE | ORAL | 0 refills | Status: DC

## 2020-04-27 NOTE — Addendum Note (Signed)
Addended by: Derenda Mis on: 04/27/2020 03:30 PM   Modules accepted: Orders

## 2020-04-27 NOTE — Telephone Encounter (Signed)
Noted thank you, patient aware.

## 2020-05-03 ENCOUNTER — Ambulatory Visit (INDEPENDENT_AMBULATORY_CARE_PROVIDER_SITE_OTHER): Payer: BC Managed Care – PPO | Admitting: Psychiatry

## 2020-05-03 ENCOUNTER — Other Ambulatory Visit: Payer: Self-pay

## 2020-05-03 ENCOUNTER — Encounter: Payer: Self-pay | Admitting: Psychiatry

## 2020-05-03 DIAGNOSIS — F331 Major depressive disorder, recurrent, moderate: Secondary | ICD-10-CM | POA: Diagnosis not present
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
## 2020-05-13 ENCOUNTER — Telehealth: Payer: Self-pay | Admitting: Psychiatry

## 2020-05-13 ENCOUNTER — Encounter: Payer: Self-pay | Admitting: Psychiatry

## 2020-05-13 ENCOUNTER — Telehealth (INDEPENDENT_AMBULATORY_CARE_PROVIDER_SITE_OTHER): Payer: BC Managed Care – PPO | Admitting: Psychiatry

## 2020-05-13 DIAGNOSIS — F4310 Post-traumatic stress disorder, unspecified: Secondary | ICD-10-CM

## 2020-05-13 DIAGNOSIS — F32A Depression, unspecified: Secondary | ICD-10-CM

## 2020-05-13 DIAGNOSIS — F410 Panic disorder [episodic paroxysmal anxiety] without agoraphobia: Secondary | ICD-10-CM

## 2020-05-13 DIAGNOSIS — F5101 Primary insomnia: Secondary | ICD-10-CM

## 2020-05-13 DIAGNOSIS — F329 Major depressive disorder, single episode, unspecified: Secondary | ICD-10-CM | POA: Diagnosis not present

## 2020-05-13 MED ORDER — GABAPENTIN 400 MG PO CAPS: ORAL_CAPSULE | ORAL | 1 refills | Status: DC

## 2020-05-13 MED ORDER — LAMOTRIGINE 200 MG PO TABS: 400.0000 mg | ORAL_TABLET | Freq: Every day | ORAL | 1 refills | Status: DC

## 2020-05-13 MED ORDER — SERTRALINE HCL 50 MG PO TABS: ORAL_TABLET | ORAL | 1 refills | Status: DC

## 2020-05-13 MED ORDER — LITHIUM CARBONATE 150 MG PO CAPS: 300.0000 mg | ORAL_CAPSULE | Freq: Every day | ORAL | 1 refills | Status: DC

## 2020-05-13 NOTE — Progress Notes (Signed)
Frank Webster [id number] March 28, 1976 44 y.o.  Virtual Visit via Video Note  /I connected with pt @ on 05/13/20 at  3:30 PM EDT by a video enabled telemedicine application and verified that I am speaking with the correct person using two identifiers.   I discussed the limitations of evaluation and management by telemedicine and the availability of in person appointments. The patient expressed understanding and agreed to proceed.  I discussed the assessment and treatment plan with the patient. The patient was provided an opportunity to ask questions and all were answered. The patient agreed with the plan and demonstrated an understanding of the instructions.   The patient was advised to call back or seek an in-person evaluation if the symptoms worsen or if the condition fails to improve as anticipated.  I provided 30 minutes of non-face-to-face time during this encounter.  The patient was located at home.  The provider was located at [facility].   Corie Chiquito, PMHNP   Subjective:   Patient ID:  Frank Webster is a 44 y.o. (DOB [birth year]) male.  Frank Webster Complaint:  Chief Complaint  Patient presents with  . Anxiety  . Follow-up    h/o depression, insomnia    HPI Frank Webster presents for follow-up of anxiety, depression, and insomnia. He reports that he has been "doing so much better mentally." Was having severe panic attacks where he had difficulty standing, severe shaking, and sweating. He denies any recent panic attacks. He reports that he continues to have some occasional anxiety where he will notice that his legs feel shaky which then triggers increased anxiety since it is similar to a panic attack. He reports that this has occurred a couple of times this week. He reports some possible social anxiety. He notices some tremor. No longer awakening with anxiety.   Denies current depressed mood. Sleeping well. He reports that he gained about 15 lbs in 2 weeks and  this has plateauaed. Appetite is low in the morning. Initiatlly was snacking more. He reports energy is lower currently due to illness. He reports improved energy and motivation. Concentration is improved and continues to have some concentration impairment. Denies SI.  Continues with TMS and has 6 remaining treatments.  Past medication trials: Prozac-vomiting Celexa Cymbalta Viibryd-initially helpful, then no longer as effective Trintellix- Worsening depression, vague SI BuSpar- Has been helpful for anxiety Wellbutrin Lamictal Trileptal Trazodone Doxazosin Clonidine Trazodone-excessive somnolence, vivid dreams Doxepin-ineffective for insomnia Belsomra- adverse reaction Lunesta- Helped for about 2 nights and then no longer effective. Klonopin- Effective for insomnia. Hydroxyzine- ineffective. Melatonin-Some benefit but does not sleep throughout the night. Gabapentin Klonopin Abilify- helped initially and then was no longer effective. Experienced wt gain. Propranolol  Review of Systems:  Review of Systems  Respiratory: Positive for cough.   Musculoskeletal: Negative for gait problem.  Neurological: Negative for tremors.  Psychiatric/Behavioral:       Please refer to HPI    Medications: I have reviewed the patient's current medications.  Current Outpatient Medications  Medication Sig Dispense Refill  . cetirizine (ZYRTEC) 10 MG tablet Take 10 mg by mouth daily.    . cholecalciferol (VITAMIN D3) 25 MCG (1000 UNIT) tablet Take 2,000 Units by mouth daily.    . fluticasone (FLONASE) 50 MCG/ACT nasal spray Place 2 sprays into both nostrils daily. (Patient taking differently: Place 2 sprays into both nostrils as needed for allergies. ) 16 g 0  . gabapentin (NEURONTIN) 400 MG capsule Take 1 capsule po BID and 2 capsules po QHS  120 capsule 1  . ibuprofen (ADVIL) 600 MG tablet Take 1 tablet (600 mg total) by mouth every 6 (six) hours as needed for moderate pain. 30 tablet 0  .  lamoTRIgine (LAMICTAL) 200 MG tablet Take 2 tablets (400 mg total) by mouth daily. 60 tablet 1  . lithium carbonate 150 MG capsule Take 2 capsules (300 mg total) by mouth at bedtime. 60 capsule 1  . Multiple Vitamins-Minerals (CENTRUM MEN PO) Take by mouth.    . rosuvastatin (CRESTOR) 10 MG tablet TAKE 1 TABLET(10 MG) BY MOUTH DAILY 30 tablet 5  . sertraline (ZOLOFT) 50 MG tablet Take 1 tab in the morning and 1.5 tabs in the evening 60 tablet 1  . TRIUMEQ 600-50-300 MG tablet TAKE ONE TABLET BY MOUTH ONCE DAILY. STORE IN ORIGINAL BOTTLE AT ROOM TEMPERATURE. 30 tablet 3  . L-Methylfolate-Algae (DEPLIN 15) 15-90.314 MG CAPS Take 15 mg by mouth daily. (Patient not taking: Reported on 05/13/2020) 30 capsule 0  . Probiotic Product (PROBIOTIC PO) Take by mouth. (Patient not taking: Reported on 04/12/2020)    . propranolol (INDERAL) 10 MG tablet Take 1-2 tabs po BID prn anxiety 120 tablet 1   No current facility-administered medications for this visit.    Medication Side Effects: Other: tremor, wt gain  Allergies: No Known Allergies  Past Medical History:  Diagnosis Date  . Anxiety state 04/05/2016  . CANDIDIASIS, ORAL 10/11/2010   Qualifier: Diagnosis of  By: Sundra AlandFarrington NP, Malvin JohnsBradford    . Decreased libido 04/05/2016  . Depression, major (HCC) 10/31/2010   Qualifier: Diagnosis of  By: Sundra AlandFarrington NP, Malvin JohnsBradford    . Dermatophytosis of unspecified site 10/24/2010   Qualifier: Diagnosis of  By: Sundra AlandFarrington NP, Malvin JohnsBradford    . Erectile dysfunction 09/26/2017  . FOLLICULITIS 10/24/2010   Qualifier: Diagnosis of  By: Sundra AlandFarrington NP, Malvin JohnsBradford    . Glossitis 10/03/2010   Qualifier: Diagnosis of  By: Sundra AlandFarrington NP, Malvin JohnsBradford    . Hemorrhoid 09/29/2012  . HIV infection (HCC)   . Inflamed seborrheic keratosis 10/11/2010   Qualifier: Diagnosis of  By: Sundra AlandFarrington NP, Malvin JohnsBradford    . Left shoulder pain 03/17/2015  . Nonallopathic lesion of cervical region 04/14/2015  . Nonallopathic lesion of lumbosacral region  04/14/2015  . Nonallopathic lesion of thoracic region 04/14/2015  . Other specified disorder of rectum and anus 10/03/2010   Qualifier: Diagnosis of  By: Sundra AlandFarrington NP, Malvin JohnsBradford    . OTITIS EXTERNA 06/14/2010   Qualifier: Diagnosis of  By: Philipp DeputyVollmer MD, Tresa EndoKelly    . Pruritus ani 10/24/2010   Qualifier: Diagnosis of  By: Sundra AlandFarrington NP, Malvin JohnsBradford    . Racing heart beat 04/21/2015  . Scapular dyskinesis 03/24/2015  . Seasonal allergies 01/27/2014  . Sprain of acromioclavicular joint 03/17/2015  . STD exposure 09/26/2017  . Substance abuse (HCC) 01/27/2014  . Substance abuse in remission (HCC)   . Testicular/scrotal pain 04/21/2015  . Tobacco abuse 03/22/2015  . URI 10/31/2010   Qualifier: Diagnosis of  By: Sundra AlandFarrington NP, Malvin JohnsBradford    . 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 Cancer Maternal Grandmother   . Tremor Father   . Eating disorder Sister   . Depression Sister   . Tremor Paternal Uncle   . Tremor Paternal Grandfather   . Eating disorder Sister   . Eating disorder Sister   . Arthritis Neg Hx   . Lupus Neg  Hx   . Rosacea Neg Hx     Social History   Socioeconomic History  . Marital status: Single    Spouse name: Not on file  . Number of children: 0  . Years of education: 36  . Highest education level: Not on file  Occupational History  . Occupation: Server  Tobacco Use  . Smoking status: Former Smoker    Packs/day: 0.75    Years: 25.00    Pack years: 18.75    Types: Cigarettes    Quit date: 03/04/2016    Years since quitting: 4.1  . Smokeless tobacco: Never Used  Vaping Use  . Vaping Use: Every day  . Substances: Nicotine  Substance and Sexual Activity  . 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 Sexual activity: Yes    Partners: Male    Birth control/protection: Condom  Other Topics Concern  . Not  on file  Social History Narrative   Fun: Yoga, hiking, going to the gym.    Social Determinants of Health   Financial Resource Strain:   . Difficulty of Paying Living Expenses:   Food Insecurity:   . Worried About Programme researcher, broadcasting/film/video in the Last Year:   . Barista in the Last Year:   Transportation Needs:   . Freight forwarder (Medical):   Marland Kitchen Lack of Transportation (Non-Medical):   Physical Activity:   . Days of Exercise per Week:   . Minutes of Exercise per Session:   Stress:   . Feeling of Stress :   Social Connections:   . Frequency of Communication with Friends and Family:   . Frequency of Social Gatherings with Friends and Family:   . Attends Religious Services:   . Active Member of Clubs or Organizations:   . Attends Banker Meetings:   Marland Kitchen Marital Status:   Intimate Partner Violence:   . Fear of Current or Ex-Partner:   . Emotionally Abused:   Marland Kitchen Physically Abused:   . Sexually Abused:     Past Medical History, Surgical history, Social history, and Family history were reviewed and updated as appropriate.   Please see review of systems for further details on the patient's review from today.   Objective:   Physical Exam:  There were no vitals taken for this visit.  Physical Exam Neurological:     Mental Status: He is alert and oriented to person, place, and time.     Cranial Nerves: No dysarthria.  Psychiatric:        Attention and Perception: Attention and perception normal.        Mood and Affect: Mood is anxious. Mood is not depressed.        Speech: Speech normal.        Behavior: Behavior is cooperative.        Thought Content: Thought content normal. Thought content is not paranoid or delusional. Thought content does not include homicidal or suicidal ideation. Thought content does not include homicidal or suicidal plan.        Cognition and Memory: Cognition and memory normal.        Judgment: Judgment normal.     Comments: Insight  intact     Lab Review:     Component Value Date/Time   NA 136 03/16/2020 1404   K 4.3 03/16/2020 1404   CL  101 03/16/2020 1404   CO2 28 03/16/2020 1404   GLUCOSE 116 (H) 03/16/2020 1404   GLUCOSE 76 02/28/2009 0000   BUN 11 03/16/2020 1404   CREATININE 0.82 03/16/2020 1404   CALCIUM 9.4 03/16/2020 1404   PROT 6.6 03/16/2020 1404   ALBUMIN 4.6 05/29/2016 1441   AST 18 03/16/2020 1404   ALT 28 03/16/2020 1404   ALKPHOS 70 05/29/2016 1441   BILITOT 0.3 03/16/2020 1404   GFRNONAA 95 08/31/2019 1436   GFRAA 110 08/31/2019 1436       Component Value Date/Time   WBC 5.2 03/16/2020 1404   RBC 4.49 03/16/2020 1404   HGB 14.4 03/16/2020 1404   HCT 43.0 03/16/2020 1404   PLT 291 03/16/2020 1404   MCV 95.8 03/16/2020 1404   MCH 32.1 03/16/2020 1404   MCHC 33.5 03/16/2020 1404   RDW 13.2 03/16/2020 1404   LYMPHSABS 2,496 03/16/2020 1404   MONOABS 360 05/29/2016 1441   EOSABS 120 03/16/2020 1404   BASOSABS 21 03/16/2020 1404    No results found for: POCLITH, LITHIUM   No results found for: PHENYTOIN, PHENOBARB, VALPROATE, CBMZ   .res Assessment: Plan:   Patient seen for 30 minutes and time spent counseling patient regarding treatment options for anxiety.  Discussed adding gabapentin during the daytime since patient reports that gabapentin at at bedtime has been helpful for his anxiety at night and upon awakening.  Discussed adding 400 mg of gabapentin in the morning for several days, then increasing to 400 mg twice daily and 800 mg at bedtime as tolerated. Discussed that propranolol could be used as needed for social anxiety and also as needed for tremors. Will continue all other medications as prescribed. Recommend continuing TMS as directed by TMS providers. Recommend continuing psychotherapy with Sherron Monday, LC [facility]. Patient to follow-up with this provider in 4 weeks or sooner if clinically indicated. Patient advised to contact office with any questions, adverse effects, or  acute worsening in signs and symptoms.   Frank Webster was seen today for anxiety and follow-up.  Diagnoses and all orders for this visit:  Panic -     gabapentin (NEURONTIN) 400 MG capsule; Take 1 capsule po BID and 2 capsules po QHS  Primary insomnia -     gabapentin (NEURONTIN) 400 MG capsule; Take 1 capsule po BID and 2 capsules po QHS  PTSD (post-traumatic stress disorder) -     sertraline (ZOLOFT) 50 MG tablet; Take 1 tab in the morning and 1.5 tabs in the evening  Depression, unspecified depression type -     lithium carbonate 150 MG capsule; Take 2 capsules (300 mg total) by mouth at bedtime. -     lamoTRIgine (LAMICTAL) 200 MG tablet; Take 2 tablets (400 mg total) by mouth daily. -     sertraline (ZOLOFT) 50 MG tablet; Take 1 tab in the morning and 1.5 tabs in the evening     Please see After Visit Summary for patient specific instructions.  Future Appointments  Date Time Provider Department Center  06/07/2020  4:00 PM May, [place], [facility] None  07/05/2020 12:00 PM May, [place], [facility] None    No orders of the defined types were placed in this encounter.     -------------------------------

## 2020-05-13 NOTE — Telephone Encounter (Signed)
Mr. phelan, schadt are scheduled for a virtual visit with your provider today.    Just as we do with appointments in the office, we must obtain your consent to participate.  Your consent will be active for this visit and any virtual visit you may have with one of our providers in the next 365 days.    If you have a MyChart account, I can also send a copy of this consent to you electronically.  All virtual visits are billed to your insurance company just like a traditional visit in the office.  As this is a virtual visit, video technology does not allow for your provider to perform a traditional examination.  This may limit your provider's ability to fully assess your condition.  If your provider identifies any concerns that need to be evaluated in person or the need to arrange testing such as labs, EKG, etc, we will make arrangements to do so.    Although advances in technology are sophisticated, we cannot ensure that it will always work on either your end or our end.  If the connection with a video visit is poor, we may have to switch to a telephone visit.  With either a video or telephone visit, we are not always able to ensure that we have a secure connection.   I need to obtain your verbal consent now.   Are you willing to proceed with your visit today?   Ector Laurel has provided verbal consent on 05/13/2020 for a virtual visit (video or telephone).   Corie Chiquito, PMHNP 05/13/2020  3:44 PM

## 2020-05-24 ENCOUNTER — Ambulatory Visit
Admission: RE | Admit: 2020-05-24 | Discharge: 2020-05-24 | Disposition: A | Discharge status: Home / Self Care | Payer: BC Managed Care – PPO | Source: Ambulatory Visit | Attending: Internal Medicine | Admitting: Internal Medicine

## 2020-05-24 ENCOUNTER — Other Ambulatory Visit: Payer: Self-pay | Admitting: Internal Medicine

## 2020-05-24 DIAGNOSIS — J209 Acute bronchitis, unspecified: Secondary | ICD-10-CM

## 2020-06-02 ENCOUNTER — Other Ambulatory Visit: Payer: Self-pay | Admitting: Infectious Diseases

## 2020-06-02 DIAGNOSIS — B20 Human immunodeficiency virus [HIV] disease: Secondary | ICD-10-CM

## 2020-06-02 DIAGNOSIS — E785 Hyperlipidemia, unspecified: Secondary | ICD-10-CM

## 2020-06-07 ENCOUNTER — Other Ambulatory Visit: Payer: Self-pay

## 2020-06-07 ENCOUNTER — Ambulatory Visit (INDEPENDENT_AMBULATORY_CARE_PROVIDER_SITE_OTHER): Payer: BC Managed Care – PPO | Admitting: Psychiatry

## 2020-06-07 ENCOUNTER — Encounter: Payer: Self-pay | Admitting: Psychiatry

## 2020-06-07 DIAGNOSIS — F331 Major depressive disorder, recurrent, moderate: Secondary | ICD-10-CM | POA: Diagnosis not present

## 2020-06-07 NOTE — Progress Notes (Signed)
      [facility] Counselor/Therapist Progress Note  Patient ID: Frank Webster, MRN: [id number],    Date: 06/07/2020  Time Spent: 58 minutes   Treatment Type: Individual Therapy  Reported Symptoms: anxiety, sad  Mental Status Exam:  Appearance:   Casual     Behavior:  Appropriate  Motor:  Normal  Speech/Language:   Clear and Coherent  Affect:  Appropriate  Mood:  anxious and sad  Thought process:  normal  Thought content:    WNL  Sensory/Perceptual disturbances:    WNL  Orientation:  oriented to person, place, time/date and situation  Attention:  Good  Concentration:  Good  Memory:  WNL  Fund of knowledge:   Good  Insight:    Good  Judgment:   Good  Impulse Control:  Good   Risk Assessment: Danger to Self:  No Self-injurious Behavior: No Danger to Others: No Duty to Warn:no Physical Aggression / Violence:No  Access to Firearms a concern: No  Gang Involvement:No   Subjective: The client states that he is sad and anxious today.  He is upset that he is up to 165 pounds.  He complains that he feels bloated and attributes it to the medication.  I explained to the client that it might be helpful for him to contact a nutritionist to help him manage his diet.  The client agrees to do so.  I also informed him of apps that he can get online that will help him to track his calories for weight loss.  He has been complaining that he is sleeping poorly.  I suggested that he really needs to start working out with cardio at least 3 days a week for half hour.  This will go a long way towards helping his sleep.  I also suggested that he do some mild weight training to help with his weight loss.  The client agreed to do so.  He also states he wakes up a lot during the night.  I suggested he talk with his primary care physician about getting tested for sleep apnea.  The client complains of low energy and he is unsure why.  I also suggested the client talk to his physician about his chronic  constipation.  I suggested he at least start with some stool softeners from over-the-counter.  The client agreed to this. The client states that his anxiety waxes and wanes through the day.  I did point out that the exercise could go a long way towards helping mitigate his anxious mood.  As the client talked about these issues he use the bilateral stimulation hand paddles.  He was able to reduce his anxiety from his subjective units of distress of 4 to less than 2.  Interventions: Assertiveness/Communication, Motivational Interviewing, Solution-Oriented/Positive Psychology, [employer] Desensitization and Reprocessing (EMDR) and Insight-Oriented  Diagnosis:   ICD-10-CM   1. Major depressive disorder, recurrent episode, moderate (HCC)  F33.1     Plan: Nutritionist, weight loss apps, talk to primary care about sleep apnea and constipation, exercise with cardio 3 times a week for half hour and mild weight training on the off days, mood independent behavior.  Gelene Mink Elisabeth Strom, [facility]

## 2020-06-15 ENCOUNTER — Other Ambulatory Visit: Payer: Self-pay

## 2020-06-15 DIAGNOSIS — F32A Depression, unspecified: Secondary | ICD-10-CM

## 2020-06-15 MED ORDER — LITHIUM CARBONATE 150 MG PO CAPS: 300.0000 mg | ORAL_CAPSULE | Freq: Every day | ORAL | 1 refills | Status: DC

## 2020-06-17 ENCOUNTER — Telehealth: Payer: Self-pay | Admitting: Psychiatry

## 2020-06-17 NOTE — Telephone Encounter (Signed)
Pt called and said that since has been on the new medicine he has been having issues. He is unable to fall asleep at night. He also he is having tremors in his hands. Also he is peeing 5 or six times during the night and not in the day time. Please give him a call at [phone]

## 2020-06-17 NOTE — Telephone Encounter (Signed)
Please review

## 2020-06-28 ENCOUNTER — Other Ambulatory Visit: Payer: Self-pay

## 2020-06-28 DIAGNOSIS — F4310 Post-traumatic stress disorder, unspecified: Secondary | ICD-10-CM

## 2020-06-28 DIAGNOSIS — F32A Depression, unspecified: Secondary | ICD-10-CM

## 2020-06-28 MED ORDER — SERTRALINE HCL 50 MG PO TABS: ORAL_TABLET | ORAL | 0 refills | Status: DC

## 2020-07-05 ENCOUNTER — Other Ambulatory Visit: Payer: Self-pay

## 2020-07-05 ENCOUNTER — Encounter: Payer: Self-pay | Admitting: Psychiatry

## 2020-07-05 ENCOUNTER — Ambulatory Visit (INDEPENDENT_AMBULATORY_CARE_PROVIDER_SITE_OTHER): Payer: BC Managed Care – PPO | Admitting: Psychiatry

## 2020-07-05 DIAGNOSIS — F5101 Primary insomnia: Secondary | ICD-10-CM

## 2020-07-05 DIAGNOSIS — F331 Major depressive disorder, recurrent, moderate: Secondary | ICD-10-CM

## 2020-07-05 DIAGNOSIS — F410 Panic disorder [episodic paroxysmal anxiety] without agoraphobia: Secondary | ICD-10-CM

## 2020-07-05 MED ORDER — GABAPENTIN 400 MG PO CAPS: ORAL_CAPSULE | ORAL | 1 refills | Status: DC

## 2020-07-05 NOTE — Progress Notes (Signed)
[facility] Counselor/Therapist Progress Note  Patient ID: Frank Webster, MRN: [id number],    Date: 07/05/2020  Time Spent: 55 minutes   Treatment Type: Individual Therapy  Reported Symptoms: anxiety, insomnia, depressed mood, lack of motivation  Mental Status Exam:  Appearance:   Casual     Behavior:  Appropriate  Motor:  Normal  Speech/Language:   Clear and Coherent  Affect:  Depressed  Mood:  anxious, depressed and sad  Thought process:  normal  Thought content:    WNL  Sensory/Perceptual disturbances:    WNL  Orientation:  oriented to person, place, time/date and situation  Attention:  Good  Concentration:  Good  Memory:  WNL  Fund of knowledge:   Good  Insight:    Good  Judgment:   Good  Impulse Control:  Good   Risk Assessment: Danger to Self:  No Self-injurious Behavior: No Danger to Others: No Duty to Warn:no Physical Aggression / Violence:No  Access to Firearms a concern: No  Gang Involvement:No   Subjective: The client states that he has been having trouble with insomnia, lack of motivation, depressed mood and anxiety.  I suggested that the client leave a message for Frank Chiquito, NP when he checks out to discuss his medications with her.  I also advised the client to make an appointment with Frank Webster as soon as possible to review his meds.  He agreed. The client has had a number of things going on.  He had an interview with [facility] a mental health provider.  The position was a call center clinician which would actually offer the client $18,000 more than his current salary which would help his financial circumstances a lot.  He felt the interview went poorly.  The people during the interview responded in inappropriate and unprofessional ways being critical and dismissive of the clients experiences.  They were upset that he had not had more experience working with people of color through [facility].  They were critical of that and it was clear to the client  that he would most likely not be hired. At the client's current position the director of the [facility] is moving out of state to work in [employer] as a harm reduction specialist.  The client saw himself as a natural successor to Catering manager position.  He was hurt and upset when he found out that director did not support him.  He did have a conversation with the director who then agreed to support him as a candidate.  During the session the client was still upset that the director had not named him first.  I used eye-movement with the client helping him move through his anger around the circumstance from the subjective units of distress of 6 to less than 3.  I also pointed out to the client that it was important to remember that he was skillful and assertive getting the director to support his candidacy.  He needs to begin to do what works and focus on that. His parents also came to visit which can always be complicated for the client.  The client states his dad was very combative and negative, "which is how things usually go" the client states. I encouraged the client to start exercising 3 to 4 days a week for at least 30 to 40 minutes.  We had talked about this at last session.  The client has joined a gym but has yet to go.  I explained that he would  have to practice more mood independent behavior to make this happen.  The client seemed uncertain if he could do this.  I pointed out that this would help his mood as well as reduce his anxiety and improve his sleep.  Interventions: Assertiveness/Communication, Motivational Interviewing, Solution-Oriented/Positive Psychology, [employer] Desensitization and Reprocessing (EMDR) and Insight-Oriented  Diagnosis:   ICD-10-CM   1. Major depressive disorder, recurrent episode, moderate (HCC)  F33.1     Plan: Follow-up with Frank Webster, mood independent behavior, assertiveness, boundaries, self-care, positive self talk, exercise 3 to 4 days a  week.  Gelene Mink Kymir Coles, [facility]

## 2020-07-08 ENCOUNTER — Encounter: Payer: Self-pay | Admitting: Psychiatry

## 2020-07-08 ENCOUNTER — Other Ambulatory Visit: Payer: Self-pay

## 2020-07-08 ENCOUNTER — Ambulatory Visit (INDEPENDENT_AMBULATORY_CARE_PROVIDER_SITE_OTHER): Payer: BC Managed Care – PPO | Admitting: Psychiatry

## 2020-07-08 DIAGNOSIS — F5101 Primary insomnia: Secondary | ICD-10-CM

## 2020-07-08 DIAGNOSIS — F39 Unspecified mood [affective] disorder: Secondary | ICD-10-CM | POA: Diagnosis not present

## 2020-07-08 DIAGNOSIS — F4310 Post-traumatic stress disorder, unspecified: Secondary | ICD-10-CM

## 2020-07-08 DIAGNOSIS — F410 Panic disorder [episodic paroxysmal anxiety] without agoraphobia: Secondary | ICD-10-CM | POA: Diagnosis not present

## 2020-07-08 MED ORDER — SERTRALINE HCL 50 MG PO TABS: ORAL_TABLET | ORAL | 0 refills | Status: DC

## 2020-07-08 MED ORDER — LAMOTRIGINE 200 MG PO TABS: 400.0000 mg | ORAL_TABLET | Freq: Every day | ORAL | 1 refills | Status: DC

## 2020-07-08 MED ORDER — LURASIDONE HCL 40 MG PO TABS: 40.0000 mg | ORAL_TABLET | Freq: Every day | ORAL | 0 refills | Status: DC

## 2020-07-08 MED ORDER — LATUDA 20 MG PO TABS: 20.0000 mg | ORAL_TABLET | Freq: Every day | ORAL | 0 refills | Status: DC

## 2020-07-08 MED ORDER — GABAPENTIN 400 MG PO CAPS: ORAL_CAPSULE | ORAL | 1 refills | Status: DC

## 2020-07-08 MED ORDER — LITHIUM CARBONATE 150 MG PO CAPS: 300.0000 mg | ORAL_CAPSULE | Freq: Every day | ORAL | 1 refills | Status: DC

## 2020-07-08 NOTE — Progress Notes (Signed)
Frank Webster [id number] 05/10/1976 44 y.o.  Subjective:   Patient ID:  Frank Webster is a 44 y.o. (DOB 1975/11/18) male.  Chief Complaint:  Chief Complaint  Patient presents with  . Depression  . Anxiety  . Sleeping Problem    HPI Frank Webster presents to the office today for follow-up of mood s/s, anxiety, and insomnia. He reports that he has been "spiraling down." He reports "I feel like I want to just disappear." He reports that he is experiencing a physical heaviness. He reports feeling more sensitive to things than he feels he should be and that he is unable to handle some things the way he would like. He reports frequent negative self-talk. Low motivation and energy. Feelings of uselessness, hopelessness, and low self-worth. He reports that he noticed worsening mood s/s about a month ago with progressive worsening. Anxiety has also been worse. He has had a few panic attacks and some have occurred out of the blue. He cannot fall asleep for hours after trying to go to sleep and then will wake up every hour to every 3 hours. He reports poor appetite. He reports that he gained 15-20 lbs in 2 weeks. Impaired concentration. Anhedonia and diminished interest other than his dog. He reports some vague SI without plan or intent.  Contracts for safety.  He reports that he has not been able to function as well at work due to mood and anxiety s/s.   Denies relapse. He reports drinking alcohol in small amounts, about 3 beers every night. Using Kratom daily.   Past medication trials: Prozac-vomiting Celexa Cymbalta Viibryd-initially helpful, then no longer as effective Trintellix- Worsening depression, vague SI BuSpar- Has been helpful for anxiety Wellbutrin Lamictal Trileptal Trazodone Doxazosin Clonidine Trazodone-excessive somnolence, vivid dreams Doxepin-ineffective for insomnia Belsomra- adverse reaction Lunesta- Helped for about 2 nights and then no longer  effective. Klonopin- Effective for insomnia. Hydroxyzine- ineffective. Melatonin-Some benefit but does not sleep throughout the night. Gabapentin Klonopin Abilify- helped initially and then was no longer effective. Experienced wt gain. Propranolol-Helped calm physical s/s of anxiety but not anxious thoughts.  Lithium  PHQ2-9     Video Visit from 04/12/2020 in [facility] for Infectious Disease Video Visit from 09/23/2019 in [facility] for Infectious Disease Office Visit from 04/09/2019 in [facility] for Infectious Disease Office Visit from 09/22/2018 in [facility] for Infectious Disease Office Visit from 09/26/2017 in [facility] for Infectious Disease  PHQ-2 Total Score 5 0 2 0 0  PHQ-9 Total Score -- -- 3 -- --       Review of Systems:  Review of Systems  Musculoskeletal: Negative for gait problem.  Neurological: Positive for tremors.  Psychiatric/Behavioral:       Please refer to HPI    Medications: I have reviewed the patient's current medications.  Current Outpatient Medications  Medication Sig Dispense Refill  . cetirizine (ZYRTEC) 10 MG tablet Take 10 mg by mouth daily.    . cholecalciferol (VITAMIN D3) 25 MCG (1000 UNIT) tablet Take 2,000 Units by mouth daily.    Marland Kitchen gabapentin (NEURONTIN) 400 MG capsule Take 1 capsule po BID and 2 capsules po QHS 120 capsule 1  . ibuprofen (ADVIL) 600 MG tablet Take 1 tablet (600 mg total) by mouth every 6 (six) hours as needed for moderate pain. 30 tablet 0  . lithium carbonate 150 MG capsule Take 2 capsules (300 mg total) by mouth at bedtime. 60 capsule  1  . MAGNESIUM PO Take by mouth.    . Multiple Vitamins-Minerals (CENTRUM MEN PO) Take by mouth.    . rosuvastatin (CRESTOR) 10 MG tablet TAKE 1 TABLET(10 MG) BY MOUTH DAILY 30 tablet 5  . sertraline (ZOLOFT) 50 MG tablet Take 1 tab in the morning and 1.5 tabs in the evening 45 tablet 0  . TRIUMEQ 600-50-300 MG  tablet TAKE ONE TABLET BY MOUTH ONCE DAILY. STORE IN ORIGINAL BOTTLE AT ROOM TEMPERATURE. 30 tablet 3  . fluticasone (FLONASE) 50 MCG/ACT nasal spray Place 2 sprays into both nostrils daily. (Patient not taking: Reported on 07/08/2020) 16 g 0  . lamoTRIgine (LAMICTAL) 200 MG tablet Take 2 tablets (400 mg total) by mouth daily. 60 tablet 1  . lurasidone (LATUDA) 20 MG TABS tablet Take 1 tablet (20 mg total) by mouth daily with supper for 7 days. 7 tablet 0  . lurasidone (LATUDA) 40 MG TABS tablet Take 1 tablet (40 mg total) by mouth daily with supper. 28 tablet 0  . propranolol (INDERAL) 10 MG tablet Take 1-2 tabs po BID prn anxiety 120 tablet 1   No current facility-administered medications for this visit.    Medication Side Effects: Other: tremor, low sex drive, sexual side effects  Allergies: No Known Allergies  Past Medical History:  Diagnosis Date  . Anxiety state 04/05/2016  . CANDIDIASIS, ORAL 10/11/2010   Qualifier: Diagnosis of  By: [name] NP, Malvin Johns    . Decreased libido 04/05/2016  . Depression, major (HCC) 10/31/2010   Qualifier: Diagnosis of  By: [name] NP, Malvin Johns    . Dermatophytosis of unspecified site 10/24/2010   Qualifier: Diagnosis of  By: [name] NP, Malvin Johns    . Erectile dysfunction 09/26/2017  . FOLLICULITIS 10/24/2010   Qualifier: Diagnosis of  By: [name] NP, Malvin Johns    . Glossitis 10/03/2010   Qualifier: Diagnosis of  By: [name] NP, Malvin Johns    . Hemorrhoid 09/29/2012  . HIV infection (HCC)   . Inflamed seborrheic keratosis 10/11/2010   Qualifier: Diagnosis of  By: [name] NP, Malvin Johns    . Left shoulder pain 03/17/2015  . Nonallopathic lesion of cervical region 04/14/2015  . Nonallopathic lesion of lumbosacral region 04/14/2015  . Nonallopathic lesion of thoracic region 04/14/2015  . Other specified disorder of rectum and anus 10/03/2010   Qualifier: Diagnosis of  By: [name] NP, Malvin Johns    . OTITIS EXTERNA 06/14/2010   Qualifier:  Diagnosis of  By: Philipp Deputy MD, Tresa Endo    . Pruritus ani 10/24/2010   Qualifier: Diagnosis of  By: [name] NP, Malvin Johns    . Racing heart beat 04/21/2015  . Scapular dyskinesis 03/24/2015  . Seasonal allergies 01/27/2014  . Sprain of acromioclavicular joint 03/17/2015  . STD exposure 09/26/2017  . Substance abuse (HCC) 01/27/2014  . Substance abuse in remission (HCC)   . Testicular/scrotal pain 04/21/2015  . Tobacco abuse 03/22/2015  . URI 10/31/2010   Qualifier: Diagnosis of  By: [name] NP, Malvin Johns    . 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 Cancer Maternal Grandmother   . Tremor Father   . Eating disorder Sister   . Depression Sister   . Tremor Paternal Uncle   . Tremor Paternal Grandfather   . Eating disorder Sister   . Eating disorder Sister   . Arthritis Neg Hx   . Lupus Neg Hx   . Rosacea  Neg Hx     Social History   Socioeconomic History  . Marital status: Single    Spouse name: Not on file  . Number of children: 0  . Years of education: 4516  . Highest education level: Not on file  Occupational History  . Occupation: Server  Tobacco Use  . Smoking status: Former Smoker    Packs/day: 0.75    Years: 25.00    Pack years: 18.75    Types: Cigarettes    Quit date: 03/04/2016    Years since quitting: 4.3  . Smokeless tobacco: Never Used  Vaping Use  . Vaping Use: Every day  . Substances: Nicotine  Substance and Sexual Activity  . Alcohol use: Yes    Alcohol/week: 21.0 standard drinks    Types: 21 Cans of beer per week  . 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 Sexual activity: Yes    Partners: Male    Birth control/protection: Condom  Other Topics Concern  . Not on file  Social History Narrative   Fun: Yoga, hiking, going to the gym.    Social Determinants of Health   Financial Resource Strain:   . Difficulty of Paying Living Expenses: Not on file  Food  Insecurity:   . Worried About Programme researcher, broadcasting/film/videounning Out of Food in the Last Year: Not on file  . Ran Out of Food in the Last Year: Not on file  Transportation Needs:   . Lack of Transportation (Medical): Not on file  . Lack of Transportation (Non-Medical): Not on file  Physical Activity:   . Days of Exercise per Week: Not on file  . Minutes of Exercise per Session: Not on file  Stress:   . Feeling of Stress : Not on file  Social Connections:   . Frequency of Communication with Friends and Family: Not on file  . Frequency of Social Gatherings with Friends and Family: Not on file  . Attends Religious Services: Not on file  . Active Member of Clubs or Organizations: Not on file  . Attends BankerClub or Organization Meetings: Not on file  . Marital Status: Not on file  Intimate Partner Violence:   . Fear of Current or Ex-Partner: Not on file  . Emotionally Abused: Not on file  . Physically Abused: Not on file  . Sexually Abused: Not on file    Past Medical History, Surgical history, Social history, and Family history were reviewed and updated as appropriate.   Please see review of systems for further details on the patient's review from today.   Objective:   Physical Exam:  There were no vitals taken for this visit.  Physical Exam Constitutional:      General: He is not in acute distress. Musculoskeletal:        General: No deformity.  Neurological:     Mental Status: He is alert and oriented to person, place, and time.     Coordination: Coordination normal.  Psychiatric:        Attention and Perception: Attention and perception normal. He does not perceive auditory or visual hallucinations.        Mood and Affect: Mood is anxious and depressed. Affect is tearful. Affect is not labile, blunt, angry or inappropriate.        Speech: Speech normal.        Behavior: Behavior normal. Behavior is cooperative.        Thought Content: Thought content normal. Thought content is not paranoid or  delusional. Thought content does not include homicidal or suicidal ideation. Thought content does not include homicidal or suicidal plan.        Cognition and Memory: Cognition and memory normal.        Judgment: Judgment normal.     Comments: Insight intact     Lab Review:     Component Value Date/Time   NA 136 03/16/2020 1404   K 4.3 03/16/2020 1404   CL 101 03/16/2020 1404   CO2 28 03/16/2020 1404   GLUCOSE 116 (H) 03/16/2020 1404   GLUCOSE 76 02/28/2009 0000   BUN 11 03/16/2020 1404   CREATININE 0.82 03/16/2020 1404   CALCIUM 9.4 03/16/2020 1404   PROT 6.6 03/16/2020 1404   ALBUMIN 4.6 05/29/2016 1441   AST 18 03/16/2020 1404   ALT 28 03/16/2020 1404   ALKPHOS 70 05/29/2016 1441   BILITOT 0.3 03/16/2020 1404   GFRNONAA 95 08/31/2019 1436   GFRAA 110 08/31/2019 1436       Component Value Date/Time   WBC 5.2 03/16/2020 1404   RBC 4.49 03/16/2020 1404   HGB 14.4 03/16/2020 1404   HCT 43.0 03/16/2020 1404   PLT 291 03/16/2020 1404   MCV 95.8 03/16/2020 1404   MCH 32.1 03/16/2020 1404   MCHC 33.5 03/16/2020 1404   RDW 13.2 03/16/2020 1404   LYMPHSABS 2,496 03/16/2020 1404   MONOABS 360 05/29/2016 1441   EOSABS 120 03/16/2020 1404   BASOSABS 21 03/16/2020 1404    No results found for: POCLITH, LITHIUM   No results found for: PHENYTOIN, PHENOBARB, VALPROATE, CBMZ   .res Assessment: Plan:   Patient seen for 45 minutes and time spent discussing recent worsening in mood and anxiety signs and symptoms.  Cautioned patient about daily use of kratom, kava, and alcohol and discussed that these substances can have mood altering effects. Provided information about kratom from up-to-date and discussed that Kratom has opiate-like and stimulant-like effects, and can cause depression, psychosis, respiratory depression, and dependence. Discussed that withdrawal symptoms can occur within 12 to 24 hours and some of what he is experiencing may be related to withdrawal.  Discussed  concern about patient's kratom use considering history of opiate dependence.  Patient reports that he will stop kratom.  Discussed that he may wish to gradually taper kratom use to minimize withdrawal signs and symptoms and that there currently are no known clinical trials supportive of specific kratom withdrawal strategies.  Encouraged patient to consider reconnecting with NA and reaching out to friends that he has from NA for support. Discussed continuing current dose of lithium for suicidal thoughts since decreasing dose could potentially worsen risk of suicidal thoughts.  Discussed potential benefits, risks, and side effects of Latuda. Discussed potential metabolic side effects associated with atypical antipsychotics, as well as potential risk for movement side effects. Advised pt to contact office if movement side effects occur.  Will start Latuda 20 mg daily with evening meal for 1 week, then increase to 40 mg daily with evening meal for mood stabilization. Will continue all other medications. Recommend continuing psychotherapy with Sherron Monday, LC [facility]. Reviewed safety plan and he contracts for safety.  Patient to follow-up with this provider in 2 to 3 weeks or sooner if clinically indicated. Patient advised to contact office with any questions, adverse effects, or acute worsening in signs and symptoms.  Zakkery was seen today for depression, anxiety and sleeping problem.  Diagnoses and all orders for this visit:  Mood disorder (HCC) -  lurasidone (LATUDA) 20 MG TABS tablet; Take 1 tablet (20 mg total) by mouth daily with supper for 7 days. -     lurasidone (LATUDA) 40 MG TABS tablet; Take 1 tablet (40 mg total) by mouth daily with supper. -     lamoTRIgine (LAMICTAL) 200 MG tablet; Take 2 tablets (400 mg total) by mouth daily. -     lithium carbonate 150 MG capsule; Take 2 capsules (300 mg total) by mouth at bedtime. -     sertraline (ZOLOFT) 50 MG tablet; Take 1 tab in the morning and  1.5 tabs in the evening  PTSD (post-traumatic stress disorder) -     sertraline (ZOLOFT) 50 MG tablet; Take 1 tab in the morning and 1.5 tabs in the evening  Panic -     gabapentin (NEURONTIN) 400 MG capsule; Take 1 capsule po BID and 2 capsules po QHS  Primary insomnia -     gabapentin (NEURONTIN) 400 MG capsule; Take 1 capsule po BID and 2 capsules po QHS     Please see After Visit Summary for patient specific instructions.  Future Appointments  Date Time Provider Department Center  08/10/2020  3:00 PM May, [place], [facility] None  09/06/2020  4:00 PM May, [place], [facility] None  10/11/2020  4:00 PM May, [place], [facility] CP-[facility] None  11/08/2020 12:00 PM May, [place], [facility] CP-CP None    No orders of the defined types were placed in this encounter.   -------------------------------

## 2020-07-25 ENCOUNTER — Ambulatory Visit (INDEPENDENT_AMBULATORY_CARE_PROVIDER_SITE_OTHER): Payer: BC Managed Care – PPO | Admitting: Psychiatry

## 2020-07-25 ENCOUNTER — Other Ambulatory Visit: Payer: Self-pay

## 2020-07-25 ENCOUNTER — Encounter: Payer: Self-pay | Admitting: Psychiatry

## 2020-07-25 DIAGNOSIS — F39 Unspecified mood [affective] disorder: Secondary | ICD-10-CM

## 2020-07-25 DIAGNOSIS — F4310 Post-traumatic stress disorder, unspecified: Secondary | ICD-10-CM | POA: Diagnosis not present

## 2020-07-25 DIAGNOSIS — F410 Panic disorder [episodic paroxysmal anxiety] without agoraphobia: Secondary | ICD-10-CM | POA: Diagnosis not present

## 2020-07-25 DIAGNOSIS — F5101 Primary insomnia: Secondary | ICD-10-CM | POA: Diagnosis not present

## 2020-07-25 MED ORDER — GABAPENTIN 400 MG PO CAPS
ORAL_CAPSULE | ORAL | 1 refills | Status: AC
Start: 2020-07-25 — End: ?

## 2020-07-25 MED ORDER — LITHIUM CARBONATE 150 MG PO CAPS: ORAL_CAPSULE | ORAL | 1 refills | Status: DC

## 2020-07-25 MED ORDER — SERTRALINE HCL 50 MG PO TABS: ORAL_TABLET | ORAL | 0 refills | Status: DC

## 2020-07-25 MED ORDER — LAMOTRIGINE 200 MG PO TABS: 400.0000 mg | ORAL_TABLET | Freq: Every day | ORAL | 1 refills | Status: DC

## 2020-07-25 NOTE — Progress Notes (Signed)
Frank Webster [id number] 1976-06-28 44 y.o.  Subjective:   Patient ID:  Frank Webster is a 44 y.o. (DOB [birth year]) male.  Chief Complaint:  Chief Complaint  Patient presents with  . Follow-up    Anxiety, depression, sleep disturbance    HPI [name] presents to the office today for follow-up of anxiety, depression, sleep disturbance.  He reports that he is feeling "so much better." He reports that he stopped using Kratom and had withdrawal s/s that caused a trauma response to this since it was similar to what he had experienced with coming off Heroin. He reports that he then started Suboxone to treat withdrawal s/s - "I feel like I am fixed." He is no longer needing naps. He reports that he is sleeping better and is no longer waking up and dreading the day. He reports that he is more motivated and addressing issues. He reports that his anxiety has been well controlled. He reports that he was able to go to a wedding recently after telling a friend in July that he could not be in his wedding due to anxiety. He reports that he was able to get through the wedding without any significant anxiety. Mood has been stable. He denies depressed mood. He reports that he feels present in the moment. He reports that he is no longer having uncontrolled crying. He reports that tremor has significantly improved. Now able to focus for sustained periods of time. He reports that he has been able to be more productive. Appetite is ok. He reports that he feels he needs to get into the habit of eating regularly. He reports that he has been doing some online shopping that is excessive and reports that he has been purchasing clothes that fit. Denies any other possible manic s/s. Improved self-care. Denies SI.   Stopped taking Latuda 4-5 days ago since it was difficult trying to take it with meals.   Started using Kratom around the time he came off Viibryd.   Past medication  trials: Prozac-vomiting Celexa Cymbalta Viibryd-initially helpful, then no longer as effective Trintellix- Worsening depression, vague SI BuSpar- Has been helpful for anxiety Wellbutrin Lamictal Trileptal Trazodone Doxazosin Clonidine Trazodone-excessive somnolence, vivid dreams Doxepin-ineffective for insomnia Belsomra- adverse reaction Lunesta- Helped for about 2 nights and then no longer effective. Klonopin- Effective for insomnia. Hydroxyzine- ineffective. Melatonin-Some benefit but does not sleep throughout the night. Gabapentin Klonopin Abilify- helped initially and then was no longer effective. Experienced wt gain. Propranolol-Helped calm physical s/s of anxiety but not anxious thoughts.  Lithium latuda- caused twitches and was difficult to take with food.   PHQ2-9     Video Visit from 04/12/2020 in [facility] for Infectious Disease Video Visit from 09/23/2019 in [facility] for Infectious Disease Office Visit from 04/09/2019 in [facility] for Infectious Disease Office Visit from 09/22/2018 in [facility] for Infectious Disease Office Visit from 09/26/2017 in [facility] for Infectious Disease  PHQ-2 Total Score 5 0 2 0 0  PHQ-9 Total Score -- -- 3 -- --       Review of Systems:  Review of Systems  Musculoskeletal: Positive for back pain. Negative for gait problem.  Neurological:       Tremors have decreased  Psychiatric/Behavioral:       Please refer to HPI    Medications: I have reviewed the patient's current medications.  Current Outpatient Medications  Medication Sig Dispense Refill  . Buprenorphine HCl-Naloxone  HCl (SUBOXONE) 12-3 MG FILM Place under the tongue.    . cetirizine (ZYRTEC) 10 MG tablet Take 10 mg by mouth daily.    . cholecalciferol (VITAMIN D3) 25 MCG (1000 UNIT) tablet Take 2,000 Units by mouth daily.    Marland Kitchen gabapentin (NEURONTIN) 400 MG capsule Take 1 capsule po  BID and 2 capsules po QHS 120 capsule 1  . ibuprofen (ADVIL) 600 MG tablet Take 1 tablet (600 mg total) by mouth every 6 (six) hours as needed for moderate pain. 30 tablet 0  . lamoTRIgine (LAMICTAL) 200 MG tablet Take 2 tablets (400 mg total) by mouth daily. 60 tablet 1  . lithium carbonate 150 MG capsule Continue 300 mg at bedtime for one week. Starting 08/02/20, decrease to 1 capsule at bedtime for 2 weeks, then discontinuing. 60 capsule 1  . MAGNESIUM PO Take by mouth.    . Multiple Vitamins-Minerals (CENTRUM MEN PO) Take by mouth.    . propranolol (INDERAL) 10 MG tablet Take 1-2 tabs po BID prn anxiety 120 tablet 1  . rosuvastatin (CRESTOR) 10 MG tablet TAKE 1 TABLET(10 MG) BY MOUTH DAILY 30 tablet 5  . sertraline (ZOLOFT) 50 MG tablet Take 1 tablet twice daily for 2 weeks, then 1/2 tablet in the morning and 1 tablet at bedtime for 2 weeks, then 1/2 tab twice daily for 2 weeks, then 1/2 tab at bedtime for 2 weeks, then stop 90 tablet 0  . TRIUMEQ 600-50-300 MG tablet TAKE ONE TABLET BY MOUTH ONCE DAILY. STORE IN ORIGINAL BOTTLE AT ROOM TEMPERATURE. 30 tablet 3  . fluticasone (FLONASE) 50 MCG/ACT nasal spray Place 2 sprays into both nostrils daily. (Patient not taking: Reported on 07/08/2020) 16 g 0   No current facility-administered medications for this visit.    Medication Side Effects: Other: Twitches, tremor, sexual side effects, excessive thirst  Allergies: No Known Allergies  Past Medical History:  Diagnosis Date  . Anxiety state 04/05/2016  . CANDIDIASIS, ORAL 10/11/2010   Qualifier: Diagnosis of  By: [name] NP, Malvin Johns    . Decreased libido 04/05/2016  . Depression, major (HCC) 10/31/2010   Qualifier: Diagnosis of  By: [name] NP, Malvin Johns    . Dermatophytosis of unspecified site 10/24/2010   Qualifier: Diagnosis of  By: [name] NP, Malvin Johns    . Erectile dysfunction 09/26/2017  . FOLLICULITIS 10/24/2010   Qualifier: Diagnosis of  By: [name] NP, Malvin Johns    .  Glossitis 10/03/2010   Qualifier: Diagnosis of  By: [name] NP, Malvin Johns    . Hemorrhoid 09/29/2012  . HIV infection (HCC)   . Inflamed seborrheic keratosis 10/11/2010   Qualifier: Diagnosis of  By: [name] NP, Malvin Johns    . Left shoulder pain 03/17/2015  . Nonallopathic lesion of cervical region 04/14/2015  . Nonallopathic lesion of lumbosacral region 04/14/2015  . Nonallopathic lesion of thoracic region 04/14/2015  . Other specified disorder of rectum and anus 10/03/2010   Qualifier: Diagnosis of  By: [name] NP, Malvin Johns    . OTITIS EXTERNA 06/14/2010   Qualifier: Diagnosis of  By: Philipp Deputy MD, Tresa Endo    . Pruritus ani 10/24/2010   Qualifier: Diagnosis of  By: [name] NP, Malvin Johns    . Racing heart beat 04/21/2015  . Scapular dyskinesis 03/24/2015  . Seasonal allergies 01/27/2014  . Sprain of acromioclavicular joint 03/17/2015  . STD exposure 09/26/2017  . Substance abuse (HCC) 01/27/2014  . Substance abuse in remission (HCC)   . Testicular/scrotal pain 04/21/2015  . Tobacco abuse 03/22/2015  . URI  10/31/2010   Qualifier: Diagnosis of  By: [name] NP, Malvin Johns    . 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 Cancer Maternal Grandmother   . Tremor Father   . Eating disorder Sister   . Depression Sister   . Tremor Paternal Uncle   . Tremor Paternal Grandfather   . Eating disorder Sister   . Eating disorder Sister   . Arthritis Neg Hx   . Lupus Neg Hx   . Rosacea Neg Hx     Social History   Socioeconomic History  . Marital status: Single    Spouse name: Not on file  . Number of children: 0  . Years of education: 27  . Highest education level: Not on file  Occupational History  . Occupation: Server  Tobacco Use  . Smoking status: Former Smoker    Packs/day: 0.75    Years: 25.00    Pack years: 18.75    Types: Cigarettes    Quit date: 03/04/2016    Years since quitting: 4.3   . Smokeless tobacco: Never Used  Vaping Use  . Vaping Use: Every day  . Substances: Nicotine  Substance and Sexual Activity  . Alcohol use: Yes    Alcohol/week: 21.0 standard drinks    Types: 21 Cans of beer per week  . 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 Sexual activity: Yes    Partners: Male    Birth control/protection: Condom  Other Topics Concern  . Not on file  Social History Narrative   Fun: Yoga, hiking, going to the gym.    Social Determinants of Health   Financial Resource Strain:   . Difficulty of Paying Living Expenses: Not on file  Food Insecurity:   . Worried About Programme researcher, broadcasting/film/video in the Last Year: Not on file  . Ran Out of Food in the Last Year: Not on file  Transportation Needs:   . Lack of Transportation (Medical): Not on file  . Lack of Transportation (Non-Medical): Not on file  Physical Activity:   . Days of Exercise per Week: Not on file  . Minutes of Exercise per Session: Not on file  Stress:   . Feeling of Stress : Not on file  Social Connections:   . Frequency of Communication with Friends and Family: Not on file  . Frequency of Social Gatherings with Friends and Family: Not on file  . Attends Religious Services: Not on file  . Active Member of Clubs or Organizations: Not on file  . Attends Banker Meetings: Not on file  . Marital Status: Not on file  Intimate Partner Violence:   . Fear of Current or Ex-Partner: Not on file  . Emotionally Abused: Not on file  . Physically Abused: Not on file  . Sexually Abused: Not on file    Past Medical History, Surgical history, Social history, and Family history were reviewed and updated as appropriate.   Please see review of systems for further details on the patient's review from today.   Objective:   Physical Exam:  There were no vitals taken for this visit.  Physical Exam Constitutional:      General: He is not in acute distress. Musculoskeletal:        General: No  deformity.  Neurological:     Mental Status: He is alert and oriented to  person, place, and time.     Coordination: Coordination normal.  Psychiatric:        Attention and Perception: Attention and perception normal. He does not perceive auditory or visual hallucinations.        Mood and Affect: Mood normal. Mood is not anxious or depressed. Affect is not labile, blunt, angry or inappropriate.        Speech: Speech normal.        Behavior: Behavior normal.        Thought Content: Thought content normal. Thought content is not paranoid or delusional. Thought content does not include homicidal or suicidal ideation. Thought content does not include homicidal or suicidal plan.        Cognition and Memory: Cognition and memory normal.        Judgment: Judgment normal.     Comments: Insight intact     Lab Review:     Component Value Date/Time   NA 136 03/16/2020 1404   K 4.3 03/16/2020 1404   CL 101 03/16/2020 1404   CO2 28 03/16/2020 1404   GLUCOSE 116 (H) 03/16/2020 1404   GLUCOSE 76 02/28/2009 0000   BUN 11 03/16/2020 1404   CREATININE 0.82 03/16/2020 1404   CALCIUM 9.4 03/16/2020 1404   PROT 6.6 03/16/2020 1404   ALBUMIN 4.6 05/29/2016 1441   AST 18 03/16/2020 1404   ALT 28 03/16/2020 1404   ALKPHOS 70 05/29/2016 1441   BILITOT 0.3 03/16/2020 1404   GFRNONAA 95 08/31/2019 1436   GFRAA 110 08/31/2019 1436       Component Value Date/Time   WBC 5.2 03/16/2020 1404   RBC 4.49 03/16/2020 1404   HGB 14.4 03/16/2020 1404   HCT 43.0 03/16/2020 1404   PLT 291 03/16/2020 1404   MCV 95.8 03/16/2020 1404   MCH 32.1 03/16/2020 1404   MCHC 33.5 03/16/2020 1404   RDW 13.2 03/16/2020 1404   LYMPHSABS 2,496 03/16/2020 1404   MONOABS 360 05/29/2016 1441   EOSABS 120 03/16/2020 1404   BASOSABS 21 03/16/2020 1404    No results found for: POCLITH, LITHIUM   No results found for: PHENYTOIN, PHENOBARB, VALPROATE, CBMZ   .res Assessment: Plan:   Discussed gradual reduction of  sertraline since patient reports having sexual side effects with sertraline and questions if sertraline was helpful for his mood and anxiety.  Discussed decreasing sertraline gradually to minimize risk of discontinuation signs and symptoms since patient experienced worsening signs and symptoms with decrease and discontinuation of Viibryd in the past. Will decrease sertraline by 25 mg every 2 weeks until discontinued. Also discussed decreasing lithium since patient is uncertain about benefits of lithium.  Recommended continuing lithium for another week until response to decrease in sertraline is known, then decreasing lithium to 150 mg at bedtime for 2 weeks, then discontinuing. Discussed that this provider could continue to manage psychiatric medications while patient is taking Suboxone, ordered.  Can be transitioned to Suboxone clinic if patient prefers. Continue gabapentin for anxiety and insomnia. Continue lamotrigine 400 mg daily for mood stabilization. Recommend continuing psychotherapy with Sherron MondayFred May, LC [facility]. Patient to follow-up with this provider in 4 to 6 weeks or sooner if clinically indicated. Patient advised to contact office with any questions, adverse effects, or acute worsening in signs and symptoms.   [name] was seen today for follow-up.  Diagnoses and all orders for this visit:  PTSD (post-traumatic stress disorder) -     sertraline (ZOLOFT) 50 MG tablet; Take 1 tablet twice  daily for 2 weeks, then 1/2 tablet in the morning and 1 tablet at bedtime for 2 weeks, then 1/2 tab twice daily for 2 weeks, then 1/2 tab at bedtime for 2 weeks, then stop  Mood disorder (HCC) -     sertraline (ZOLOFT) 50 MG tablet; Take 1 tablet twice daily for 2 weeks, then 1/2 tablet in the morning and 1 tablet at bedtime for 2 weeks, then 1/2 tab twice daily for 2 weeks, then 1/2 tab at bedtime for 2 weeks, then stop -     lithium carbonate 150 MG capsule; Continue 300 mg at bedtime for one week.  Starting 08/02/20, decrease to 1 capsule at bedtime for 2 weeks, then discontinuing. -     lamoTRIgine (LAMICTAL) 200 MG tablet; Take 2 tablets (400 mg total) by mouth daily.  Panic -     gabapentin (NEURONTIN) 400 MG capsule; Take 1 capsule po BID and 2 capsules po QHS  Primary insomnia -     gabapentin (NEURONTIN) 400 MG capsule; Take 1 capsule po BID and 2 capsules po QHS     Please see After Visit Summary for patient specific instructions.  Future Appointments  Date Time Provider Department Center  08/10/2020  3:00 PM May, [place], [facility]-CP None  09/06/2020  4:00 PM May, [place], [facility] None  09/20/2020  2:30 PM Corie Chiquito, PMHNP CP-CP None  10/11/2020  4:00 PM May, [place], [facility] None  11/08/2020 12:00 PM May, [place], [facility]-CP None    No orders of the defined types were placed in this encounter.   -------------------------------

## 2020-08-10 ENCOUNTER — Ambulatory Visit: Payer: BC Managed Care – PPO | Admitting: Psychiatry

## 2020-09-06 ENCOUNTER — Other Ambulatory Visit: Payer: Self-pay

## 2020-09-06 ENCOUNTER — Encounter: Payer: Self-pay | Admitting: Psychiatry

## 2020-09-06 ENCOUNTER — Ambulatory Visit (INDEPENDENT_AMBULATORY_CARE_PROVIDER_SITE_OTHER): Payer: BC Managed Care – PPO | Admitting: Psychiatry

## 2020-09-06 DIAGNOSIS — F331 Major depressive disorder, recurrent, moderate: Secondary | ICD-10-CM

## 2020-09-06 NOTE — Progress Notes (Signed)
°      [facility] Counselor/Therapist Progress Note  Patient ID: Frank Webster, MRN: [id number],    Date: 09/06/2020  Time Spent: 60 minutes   Treatment Type: Individual Therapy  Reported Symptoms: depression, anxiety, medication withdrawal.  Mental Status Exam:  Appearance:   Casual     Behavior:  Appropriate  Motor:  Normal  Speech/Language:   Clear and Coherent  Affect:  Appropriate  Mood:  anxious and depressed  Thought process:  normal  Thought content:    WNL  Sensory/Perceptual disturbances:    WNL  Orientation:  oriented to person, place, time/date and situation  Attention:  Good  Concentration:  Good  Memory:  WNL  Fund of knowledge:   Good  Insight:    Good  Judgment:   Good  Impulse Control:  Good   Risk Assessment: Danger to Self:  No Self-injurious Behavior: No Danger to Others: No Duty to Warn:no Physical Aggression / Violence:No  Access to Firearms a concern: No  Gang Involvement:No   Subjective: The client has continued to struggle with his job at [employer] stop.  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  He is pleased that there is an increase in pay.  He also already feels more appreciated at his new position that he begins on Monday, 29 November.  The client has been struggling with his medications.  He had been drinking kratom not knowing that it had addictive qualities and when he had stopped caused withdrawals.  He found that after consulting with a Suboxone psychiatrist that Suboxone helped his anxiety and depression quite a bit and also ameliorated his withdrawal from kratom.  He has discontinued the lithium and Zoloft as well as the kratom.  He recently realized he was having withdrawal symptoms with the Zoloft as well.  He is being treated with hydroxyzine. The client feels like he has been on a roller coaster with the medication changes.  I encouraged the client and reminded him  that all his emotional responses were coming from the medication withdrawals.  As soon as that was out of his system things would stabilize.  I also reminded the client that his tendency is to take what other people say as true.  He needs to stand firm on the fact that he is capable and competent.  He agreed.  As we talked through these issues the client use the bilateral stimulation hand paddles.  His subjective units of distress went from a 5+ to less than 2.  His positive cognition at the end of the session was, "I will be okay."  Interventions: Assertiveness/Communication, Motivational Interviewing, Solution-Oriented/Positive Psychology, Eye Movement Desensitization and Reprocessing (EMDR) and Insight-Oriented  Diagnosis:   ICD-10-CM   1. Major depressive disorder, recurrent episode, moderate (HCC)  F33.1     Plan: Exercise daily, mood independent behavior, positive self talk, self-care, sleep hygiene, assertiveness, boundaries, radical acceptance.  Gelene Mink Zedekiah Hinderman, [facility]

## 2020-09-07 MED ORDER — ELVITEG-COBIC-EMTRICIT-TENOFAF 150-150-200-10 MG PO TABS: 1.0000 | ORAL_TABLET | Freq: Every day | ORAL | 5 refills | Status: DC

## 2020-09-07 NOTE — Addendum Note (Signed)
Addended by: Blanchard Kelch on: 09/07/2020 11:25 AM   Modules accepted: Orders

## 2020-09-20 ENCOUNTER — Ambulatory Visit: Payer: BC Managed Care – PPO | Admitting: Psychiatry

## 2020-09-21 ENCOUNTER — Telehealth: Payer: Self-pay | Admitting: *Deleted

## 2020-09-21 NOTE — Telephone Encounter (Signed)
Received notice patient has drug-drug interaction between genvoya and oxcarbazepine. Sent message to patient to see if he is on oxcarbazepine, as it is not on his medication list and this was previously addressed. Andree Coss, RN

## 2020-09-28 ENCOUNTER — Other Ambulatory Visit: Payer: Self-pay | Admitting: Psychiatry

## 2020-09-28 DIAGNOSIS — F39 Unspecified mood [affective] disorder: Secondary | ICD-10-CM

## 2020-10-11 ENCOUNTER — Ambulatory Visit: Payer: BC Managed Care – PPO | Admitting: Psychiatry

## 2020-10-28 ENCOUNTER — Other Ambulatory Visit: Payer: Self-pay | Admitting: Psychiatry

## 2020-10-28 DIAGNOSIS — F39 Unspecified mood [affective] disorder: Secondary | ICD-10-CM

## 2020-11-08 ENCOUNTER — Encounter: Payer: Self-pay | Admitting: Psychiatry

## 2020-11-08 ENCOUNTER — Other Ambulatory Visit: Payer: Self-pay

## 2020-11-08 ENCOUNTER — Ambulatory Visit (INDEPENDENT_AMBULATORY_CARE_PROVIDER_SITE_OTHER): Payer: BC Managed Care – PPO | Admitting: Psychiatry

## 2020-11-08 DIAGNOSIS — F331 Major depressive disorder, recurrent, moderate: Secondary | ICD-10-CM | POA: Diagnosis not present

## 2020-11-08 NOTE — Progress Notes (Signed)
[facility] Counselor/Therapist Progress Note  Patient ID: Frank Webster, MRN: [id number],    Date: 11/08/2020  Time Spent: 50 minutes   Treatment Type: Individual Therapy  Reported Symptoms: anxiety, sadness, anhedonia, depressed mood, social withdrawl  Mental Status Exam:  Appearance:   Casual     Behavior:  Agitated  Motor:  Normal  Speech/Language:   Clear and Coherent  Affect:  Tearful  Mood:  anxious, depressed and sad  Thought process:  normal  Thought content:    WNL  Sensory/Perceptual disturbances:    WNL  Orientation:  oriented to person, place, time/date and situation  Attention:  Good  Concentration:  Good  Memory:  WNL  Fund of knowledge:   Good  Insight:    Good  Judgment:   Good  Impulse Control:  Good   Risk Assessment: Danger to Self:  No Self-injurious Behavior: No Danger to Others: No Duty to Warn:no Physical Aggression / Violence:No  Access to Firearms a concern: No  Gang Involvement:No   Subjective: The client states that he has had a conflict with the front desk staff over getting his records, specifically his gene testing.  He felt the administrator on the phone was not helpful and in fact combative.  "I am so overwhelmed right now.  Everything is too much."  Today I used eye-movement with the client focusing on his sense of feeling overwhelmed.  His negative cognition is, "I feel stuck."  He feels anxiety and sadness in his chest.  His subjective units of distress is a 10+.  As the client processed he stated that he is continuing to be on Suboxone.  He notes that it helped him at first when he was getting off the kratom.  He has switched his psychiatrist to Dr. Mosetta Anis at the Suboxone clinic.  She currently has him on Suboxone and lamotrigine.  He is not sure if it is helping.  She states that he needs more EMDR work to process his trauma.  I discussed with the client that from his childhood up through his recovery he has had a lot of  issues that he has needed to process.  The client complains that he worked through so much four years ago.  I agreed with the client that he had but developmentally as he moves through life things come up in a different way.  Today we began to process how stuck he felt.  As he processed he stated that his new job although better paying he finds quite boring.  What he does enjoy is the one-on-one work with the clients.  When someone is in crisis is when he does his best.  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  The client brightened significantly at this.  His subjective units of distress was at a 4.  He will look into jobs connected to this interest.  I also recommended to the client that he do his best to continue to be mindful and staying in the present tense.  Some journaling would be helpful to get a hold of what he is thinking.  He agreed.  I also suggested that he try to write a vision for his future including where he might want to live and what a job would look like.  The client agreed.  Interventions: Assertiveness/Communication, Mindfulness Meditation, Motivational Interviewing, Solution-Oriented/Positive Psychology, [employer] Desensitization and Reprocessing (EMDR) and Insight-Oriented  Diagnosis:  ICD-10-CM   1. Major depressive disorder, recurrent episode, moderate (HCC)  F33.1     Plan: Mindfulness, journaling, mood independent behavior, positive self talk, self-care, mild exercise, omega-3 fatty acids, B vitamins, regular sleep, vision for his future, review of other jobs that include crisis intervention.  Gelene Mink Marri Mcneff, [facility]

## 2020-11-14 ENCOUNTER — Ambulatory Visit (INDEPENDENT_AMBULATORY_CARE_PROVIDER_SITE_OTHER): Payer: BC Managed Care – PPO | Admitting: Psychiatry

## 2020-11-14 ENCOUNTER — Encounter: Payer: Self-pay | Admitting: Psychiatry

## 2020-11-14 ENCOUNTER — Other Ambulatory Visit: Payer: Self-pay

## 2020-11-14 DIAGNOSIS — F331 Major depressive disorder, recurrent, moderate: Secondary | ICD-10-CM | POA: Diagnosis not present

## 2020-11-14 NOTE — Progress Notes (Signed)
[facility] Counselor/Therapist Progress Note  Patient ID: Frank Webster, MRN: [id number],    Date: 11/14/2020  Time Spent: 50 minutes   Treatment Type: Individual Therapy  Reported Symptoms: anxiety, lack of motivation, sadness  Mental Status Exam:  Appearance:   Casual     Behavior:  Appropriate  Motor:  Normal  Speech/Language:   Clear and Coherent  Affect:  Appropriate  Mood:  anxious and sad  Thought process:  normal  Thought content:    WNL  Sensory/Perceptual disturbances:    WNL  Orientation:  oriented to person, place, time/date and situation  Attention:  Good  Concentration:  Good  Memory:  WNL  Fund of knowledge:   Good  Insight:    Good  Judgment:   Good  Impulse Control:  Good   Risk Assessment: Danger to Self:  No Self-injurious Behavior: No Danger to Others: No Duty to Warn:no Physical Aggression / Violence:No  Access to Firearms a concern: No  Gang Involvement:No   Subjective: The client states that today he is overwhelmed with anxiety and sadness.  His negative cognitions are, "I am feeling stuck.  I am not motivated.  This is it.  I have to settle."  Today I started with the bilateral stimulation hand paddles with the client as he discussed how overwhelmed he was.  He did say that he has been journaling and realized how horrible his self talk is.  "I have also been too passive."  The client has been going to the gym at least 3 times since last session.  We discussed his workout.  I encouraged the client to increase his cardio from 10 minutes to 35 to 40 minutes.  I also asked him to limit his number of sets of weight lifting to 3 with maximum repetitions of 8.  I also encouraged him to do some sit ups as well.  The client has agreed to do so.  In reviewing the client's negative cognitions I asked him to describe where he was 15 years ago.  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  He said life was really exciting then but he  was using major amounts of cocaine, crack and heroin.  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  He described the process of getting into rehab.  He ultimately relapsed and went back into treatment once again.  During all that time I pointed out to the client that he was in a much worse place than he is now.  Currently he is employed, he has a Event organiser in social work and he has a safe place to live.  The client states he feels like he has no options.  I pointed out that the client has lots of options.  He has to choose to determine what they are.  There are many different places that he could move to and use his social work degree.  I asked the client to start writing down some of his goals.  I also asked him to write an essay on what he hopes for his future using positive cognitions.  The client agreed.  His subjective units of distress went from 8+ to less than 3.  Interventions: Assertiveness/Communication, Mindfulness Meditation, Motivational Interviewing, Solution-Oriented/Positive Psychology, [employer] Desensitization and Reprocessing (EMDR) and Insight-Oriented  Diagnosis:   ICD-10-CM   1. Major depressive disorder, recurrent episode, moderate (HCC)  F33.1     Plan: Mindfulness, mood independent  behavior, radical acceptance, positive self talk, journaling, exercise, set goals, write out vision for himself.  Gelene Mink Krystena Reitter, [facility]

## 2020-11-30 ENCOUNTER — Other Ambulatory Visit: Payer: Self-pay | Admitting: Psychiatry

## 2020-11-30 DIAGNOSIS — F39 Unspecified mood [affective] disorder: Secondary | ICD-10-CM

## 2020-12-05 ENCOUNTER — Encounter: Payer: Self-pay | Admitting: *Deleted

## 2020-12-05 ENCOUNTER — Other Ambulatory Visit: Payer: Self-pay | Admitting: Infectious Diseases

## 2020-12-05 DIAGNOSIS — E785 Hyperlipidemia, unspecified: Secondary | ICD-10-CM

## 2020-12-06 ENCOUNTER — Other Ambulatory Visit: Payer: Self-pay

## 2020-12-06 ENCOUNTER — Ambulatory Visit (INDEPENDENT_AMBULATORY_CARE_PROVIDER_SITE_OTHER): Payer: BC Managed Care – PPO | Admitting: Psychiatry

## 2020-12-06 ENCOUNTER — Encounter: Payer: Self-pay | Admitting: Psychiatry

## 2020-12-06 DIAGNOSIS — F331 Major depressive disorder, recurrent, moderate: Secondary | ICD-10-CM | POA: Diagnosis not present

## 2020-12-06 NOTE — Progress Notes (Signed)
[facility] Counselor/Therapist Progress Note  Patient ID: Frank Webster, MRN: [id number],    Date: 12/06/2020  Time Spent: 50 minutes   Treatment Type: Individual Therapy  Reported Symptoms: anxiety, fatigue, lack of motivation, sadness, depressed mood, hopelessness, irritable   Mental Status Exam:  Appearance:   Casual     Behavior:  Appropriate  Motor:  Normal  Speech/Language:   Clear and Coherent  Affect:  Depressed and Tearful  Mood:  anxious, depressed, irritable and sad  Thought process:  normal  Thought content:    WNL  Sensory/Perceptual disturbances:    WNL  Orientation:  oriented to person, place, time/date and situation  Attention:  Good  Concentration:  Good  Memory:  WNL  Fund of knowledge:   Good  Insight:    Good  Judgment:   Good  Impulse Control:  Good   Risk Assessment: Danger to Self:  No Self-injurious Behavior: No Danger to Others: No Duty to Warn:no Physical Aggression / Violence:No  Access to Firearms a concern: No  Gang Involvement:No   Subjective: The client states that he has been working a lot to catch up on notes.  He states he comes home fatigued and unmotivated to do anything.  "I am unfulfilled."  The client does not feel happy and is not even sure what it looks like to be happy.  The client stated that he did zoom meetings with new clients today.  "There is so much sadness out there."  The client does not feel that his job is currently a good fit.  We discussed what had been a good fit for him in the past?  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  We discussed the fact that the client is now not in a community.  He no longer attends NA and has lost that fellowship.  The larger gay community is not really available to him in [place].  He does not feel connected. I discussed with the client some things that he could do personally to get his own process going for him.  We  discussed spiritual practices.  The client has done yoga and meditation in the past and Frank Webster reconsider that.  I also suggested that he begin to think about where other communities that he might want to live?  I told the client to cast his net wide and look at small gay communities that he could join and also check to see if there is work opportunities there.  The client feels completely unmotivated to do any of this.  I discussed the fact that he needs to make the steps himself.  This will require more mood independent behavior.  If he does not do anything then nothing will happen.  I suggested that he write down his ideas in a journal.  I also suggested to the client that he set goals for himself such as planning to move by the time he is 46.  The client will consider some of these things.  In the meantime I suggested to the client that he do things that nurture and care for himself.  He wants to laugh more so start with watching comedies or comedy shows or going to a comedy club.  The client agrees.  Interventions: Mindfulness Meditation, Motivational Interviewing, Solution-Oriented/Positive Psychology, [employer] Desensitization and Reprocessing (EMDR) and Insight-Oriented  Diagnosis:   ICD-10-CM   1. Major depressive disorder, recurrent episode, moderate (  HCC)  F33.1     Plan: Search for new communities to moved to, mood independent behavior, laugh more, yoga, exercise, positive self talk, explore new job opportunities, spiritual practices.  Frank Webster, [facility]

## 2020-12-14 ENCOUNTER — Encounter: Payer: Self-pay | Admitting: Psychiatry

## 2020-12-14 ENCOUNTER — Other Ambulatory Visit: Payer: Self-pay

## 2020-12-14 ENCOUNTER — Ambulatory Visit (INDEPENDENT_AMBULATORY_CARE_PROVIDER_SITE_OTHER): Payer: BC Managed Care – PPO | Admitting: Psychiatry

## 2020-12-14 DIAGNOSIS — F331 Major depressive disorder, recurrent, moderate: Secondary | ICD-10-CM

## 2020-12-14 NOTE — Progress Notes (Signed)
      [facility] Counselor/Therapist Progress Note  Patient ID: Frank Webster, MRN: [id number],    Date: 12/14/2020  Time Spent: 50 minutes   Treatment Type: Individual Therapy  Reported Symptoms: sad, anxious  Mental Status Exam:  Appearance:   Casual and Well Groomed     Behavior:  Appropriate  Motor:  Normal  Speech/Language:   Clear and Coherent  Affect:  Appropriate  Mood:  anxious and sad  Thought process:  normal  Thought content:    WNL  Sensory/Perceptual disturbances:    WNL  Orientation:  oriented to person, place, time/date and situation  Attention:  Good  Concentration:  Good  Memory:  WNL  Fund of knowledge:   Good  Insight:    Good  Judgment:   Good  Impulse Control:  Good   Risk Assessment: Danger to Self:  No Self-injurious Behavior: No Danger to Others: No Duty to Warn:no Physical Aggression / Violence:No  Access to Firearms a concern: No  Gang Involvement:No   Subjective: The client is much brighter today.  "I am sleeping a little bit better".  Once he changed his HIV med that was interfering with his sleep he has done better.  He does admit that he is not exercising.  "I am working so much.  I am overwhelmed by the amount of documentation I have to do."  Client works remotely from his house and occasionally goes into the office.  He states he is trying to make plans for the future.  A friend has alerted him to a position at the [employer].  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  The client is applying for it and feels it would be a very good fit for him.  He realizes that "his people" are those that are suffering from entrenched substance abuse.  This is the population he wants to consistently work with.  I suggested to the client that he check with other agencies that [employer] need similar types of employees doing what he offers.  He will check with the [facility] to see if there is  anything available there as well. The client used his bilateral stimulation hand paddles through the course of the session.  He was able to reduce his subjective units of distress from 5 to less than 2.  Interventions: Assertiveness/Communication, Motivational Interviewing, Solution-Oriented/Positive Psychology, [employer] Desensitization and Reprocessing (EMDR) and Insight-Oriented  Diagnosis:   ICD-10-CM   1. Major depressive disorder, recurrent episode, moderate (HCC)  F33.1     Plan: Exercise, mood independent behavior, positive self talk, self-care, apply for new positions at the [facility], explore positions at the [employer].  Gelene Mink Ayda Tancredi, [facility]

## 2020-12-21 ENCOUNTER — Telehealth: Payer: Self-pay

## 2020-12-21 ENCOUNTER — Other Ambulatory Visit: Payer: Self-pay

## 2020-12-21 MED ORDER — ELVITEG-COBIC-EMTRICIT-TENOFAF 150-150-200-10 MG PO TABS: 1.0000 | ORAL_TABLET | Freq: Every day | ORAL | 0 refills | Status: DC

## 2020-12-21 NOTE — Telephone Encounter (Signed)
Contacted patient after receiving faxed refill request for Genvoya from [facility]. Last refills sent to [facility]. Will need to confirm patient's pharmacy to ensure medication is sent to correct location.  [facility] P: [phone] F: [phone]

## 2020-12-22 ENCOUNTER — Other Ambulatory Visit: Payer: Self-pay | Admitting: *Deleted

## 2020-12-22 ENCOUNTER — Encounter: Payer: Self-pay | Admitting: *Deleted

## 2020-12-22 DIAGNOSIS — E7801 Familial hypercholesterolemia: Secondary | ICD-10-CM

## 2020-12-22 DIAGNOSIS — Z113 Encounter for screening for infections with a predominantly sexual mode of transmission: Secondary | ICD-10-CM

## 2020-12-22 DIAGNOSIS — Z79899 Other long term (current) drug therapy: Secondary | ICD-10-CM

## 2020-12-22 DIAGNOSIS — B20 Human immunodeficiency virus [HIV] disease: Secondary | ICD-10-CM

## 2020-12-30 ENCOUNTER — Other Ambulatory Visit: Payer: Self-pay

## 2020-12-30 ENCOUNTER — Other Ambulatory Visit: Payer: BC Managed Care – PPO

## 2020-12-30 DIAGNOSIS — Z113 Encounter for screening for infections with a predominantly sexual mode of transmission: Secondary | ICD-10-CM

## 2020-12-30 DIAGNOSIS — B20 Human immunodeficiency virus [HIV] disease: Secondary | ICD-10-CM

## 2020-12-30 DIAGNOSIS — E78019 Familial hypercholesterolemia, unspecified: Secondary | ICD-10-CM

## 2020-12-30 DIAGNOSIS — Z79899 Other long term (current) drug therapy: Secondary | ICD-10-CM

## 2020-12-30 DIAGNOSIS — E7801 Familial hypercholesterolemia: Secondary | ICD-10-CM

## 2021-01-02 LAB — CBC WITH DIFFERENTIAL/PLATELET
Absolute Monocytes: 515 cells/uL (ref 200–950)
Basophils Absolute: 10 cells/uL (ref 0–200)
Basophils Relative: 0.2 %
Eosinophils Absolute: 88 cells/uL (ref 15–500)
Eosinophils Relative: 1.7 %
HCT: 42.4 % (ref 38.5–50.0)
Hemoglobin: 14.5 g/dL (ref 13.2–17.1)
Lymphs Abs: 2283 cells/uL (ref 850–3900)
MCH: 31.7 pg (ref 27.0–33.0)
MCHC: 34.2 g/dL (ref 32.0–36.0)
MCV: 92.8 fL (ref 80.0–100.0)
MPV: 9.6 fL (ref 7.5–12.5)
Monocytes Relative: 9.9 %
Neutro Abs: 2304 cells/uL (ref 1500–7800)
Neutrophils Relative %: 44.3 %
Platelets: 297 10*3/uL (ref 140–400)
RBC: 4.57 10*6/uL (ref 4.20–5.80)
RDW: 12.5 % (ref 11.0–15.0)
Total Lymphocyte: 43.9 %
WBC: 5.2 10*3/uL (ref 3.8–10.8)

## 2021-01-02 LAB — COMPLETE METABOLIC PANEL WITH GFR
AG Ratio: 1.9 (calc) (ref 1.0–2.5)
ALT: 40 U/L (ref 9–46)
AST: 26 U/L (ref 10–40)
Albumin: 4.4 g/dL (ref 3.6–5.1)
Alkaline phosphatase (APISO): 78 U/L (ref 36–130)
BUN: 14 mg/dL (ref 7–25)
CO2: 28 mmol/L (ref 20–32)
Calcium: 9.4 mg/dL (ref 8.6–10.3)
Chloride: 103 mmol/L (ref 98–110)
Creat: 0.62 mg/dL (ref 0.60–1.35)
GFR, Est African American: 138 mL/min/{1.73_m2} (ref >=60)
GFR, Est Non African American: 119 mL/min/{1.73_m2} (ref >=60)
Globulin: 2.3 g/dL (calc) (ref 1.9–3.7)
Glucose, Bld: 99 mg/dL (ref 65–99)
Potassium: 4.5 mmol/L (ref 3.5–5.3)
Sodium: 140 mmol/L (ref 135–146)
Total Bilirubin: 0.4 mg/dL (ref 0.2–1.2)
Total Protein: 6.7 g/dL (ref 6.1–8.1)

## 2021-01-02 LAB — LIPID PANEL
Cholesterol: 188 mg/dL (ref <200)
HDL: 38 mg/dL — ABNORMAL LOW (ref >=40)
LDL Cholesterol (Calc): 129 mg/dL (calc) — ABNORMAL HIGH
Non-HDL Cholesterol (Calc): 150 mg/dL (calc) — ABNORMAL HIGH (ref <130)
Total CHOL/HDL Ratio: 4.9 (calc) (ref <5.0)
Triglycerides: 104 mg/dL (ref <150)

## 2021-01-02 LAB — HIV-1 RNA QUANT-NO REFLEX-BLD
HIV 1 RNA Quant: 21 Copies/mL — ABNORMAL HIGH
HIV-1 RNA Quant, Log: 1.33 Log cps/mL — ABNORMAL HIGH

## 2021-01-02 LAB — T-HELPER CELLS (CD4) COUNT (NOT AT ARMC)
Absolute CD4: 1057 cells/uL (ref 490–1740)
CD4 T Helper %: 45 % (ref 30–61)
Total lymphocyte count: 2350 cells/uL (ref 850–3900)

## 2021-01-02 LAB — RPR: RPR Ser Ql: NONREACTIVE

## 2021-01-03 ENCOUNTER — Ambulatory Visit (INDEPENDENT_AMBULATORY_CARE_PROVIDER_SITE_OTHER): Payer: BC Managed Care – PPO | Admitting: Psychiatry

## 2021-01-03 ENCOUNTER — Other Ambulatory Visit: Payer: Self-pay

## 2021-01-03 ENCOUNTER — Encounter: Payer: Self-pay | Admitting: Psychiatry

## 2021-01-03 DIAGNOSIS — F331 Major depressive disorder, recurrent, moderate: Secondary | ICD-10-CM | POA: Diagnosis not present

## 2021-01-03 NOTE — Progress Notes (Signed)
      [facility] Counselor/Therapist Progress Note  Patient ID: Frank Webster, MRN: [id number],    Date: 01/03/2021  Time Spent: 45 minutes   Treatment Type: Individual Therapy  Reported Symptoms: sad, depressed  Mental Status Exam:  Appearance:   Casual     Behavior:  Appropriate  Motor:  Normal  Speech/Language:   Clear and Coherent  Affect:  Depressed  Mood:  depressed and sad  Thought process:  normal  Thought content:    WNL  Sensory/Perceptual disturbances:    WNL  Orientation:  oriented to person, place, time/date and situation  Attention:  Good  Concentration:  Good  Memory:  WNL  Fund of knowledge:   Good  Insight:    Good  Judgment:   Good  Impulse Control:  Good   Risk Assessment: Danger to Self:  No Self-injurious Behavior: No Danger to Others: No Duty to Warn:no Physical Aggression / Violence:No  Access to Firearms a concern: No  Gang Involvement:No   Subjective: The client states that 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  He is very excited about the possibility of working from the [employer].  It was a boost to his self-esteem to be considered.  The client notes today that his mood has improved although he is still sad and depressed.  He believes that the medicine Amea Mcphail be working in combination with the better weather.  Today the client discussed whether or not he should try to move to a climate that is much sunnier.  He stated coming through the winter with his depression was terrible this year.  He feels being in the sunnier climate and and accommodating gay population would be much more positive for him.  We discussed the fact that the company he works for [employer] have offices in [place] that he could apply to.  There are also other harm reduction organizations in [place] that he could explore as well.  The client agreed that he would work on this.  He is planning on taking a trip to [place] over his birthday and will  hopefully have some interviews when that occurs.  I used the bilateral stimulation hand paddles with the client which reduced his subjective units of distress from a 5+ to less than 2.  Interventions: Assertiveness/Communication, Motivational Interviewing, Solution-Oriented/Positive Psychology, [employer] Desensitization and Reprocessing (EMDR) and Insight-Oriented  Diagnosis:   ICD-10-CM   1. Major depressive disorder, recurrent episode, moderate (HCC)  F33.1     Plan: Explore job options for harm reduction in [place], positive self talk, self-care, sun exposure, assertiveness, boundaries.  Gelene Mink Alexismarie Flaim, [facility]

## 2021-01-13 NOTE — Progress Notes (Deleted)
Name: Frank Webster   DOB: 12-17-1975   MRN: [id number]   PCP: Renford Dills, MD    SUBJECTIVE: Brief Narrative:  Frank Webster is a 45 y.o. male with HIV disease. Has seen Dr. Luciana Axe in the clinic and previously well controlled with low level viremia < 100 copies the last few draws. Diagnosed >22 years ago; had a time where he struggled with adherence 2/2 drug use. OI Hx: none.  HIV Risk: MSM, previous drug use.   Previous Regimens:   Genvoya    Triumeq + Tivicay PM (DDI with Trileptel)  Triumeq >> suppressed    Genotype:   01/2014 - no INSTI resistance, wild type      CC:  Routine HIV follow up care.    HPI: ***   Review of Systems  Constitutional: Negative for chills, fever, malaise/fatigue and weight loss.  HENT: Negative for sore throat.        No dental problems  Respiratory: Negative for cough and sputum production.   Cardiovascular: Negative for chest pain and leg swelling.  Gastrointestinal: Negative for abdominal pain, diarrhea and vomiting.  Genitourinary: Negative for dysuria and flank pain.  Musculoskeletal: Negative for joint pain, myalgias and neck pain.  Skin: Negative for rash.  Neurological: Negative for dizziness, tingling and headaches.  Psychiatric/Behavioral: Positive for depression. Negative for substance abuse and suicidal ideas. The patient is not nervous/anxious and does not have insomnia.     Past Medical History:  Diagnosis Date  . Anxiety state 04/05/2016  . CANDIDIASIS, ORAL 10/11/2010   Qualifier: Diagnosis of  By: [name] NP, Malvin Johns    . Decreased libido 04/05/2016  . Depression, major (HCC) 10/31/2010   Qualifier: Diagnosis of  By: [name] NP, Malvin Johns    . Dermatophytosis of unspecified site 10/24/2010   Qualifier: Diagnosis of  By: [name] NP, Malvin Johns    . Erectile dysfunction 09/26/2017  . FOLLICULITIS 10/24/2010   Qualifier: Diagnosis of  By: [name] NP, Malvin Johns    . Glossitis 10/03/2010   Qualifier:  Diagnosis of  By: [name] NP, Malvin Johns    . Hemorrhoid 09/29/2012  . HIV infection (HCC)   . Inflamed seborrheic keratosis 10/11/2010   Qualifier: Diagnosis of  By: [name] NP, Malvin Johns    . Left shoulder pain 03/17/2015  . Nonallopathic lesion of cervical region 04/14/2015  . Nonallopathic lesion of lumbosacral region 04/14/2015  . Nonallopathic lesion of thoracic region 04/14/2015  . Other specified disorder of rectum and anus 10/03/2010   Qualifier: Diagnosis of  By: [name] NP, Malvin Johns    . OTITIS EXTERNA 06/14/2010   Qualifier: Diagnosis of  By: Philipp Deputy MD, Tresa Endo    . Pruritus ani 10/24/2010   Qualifier: Diagnosis of  By: [name] NP, Malvin Johns    . Racing heart beat 04/21/2015  . Scapular dyskinesis 03/24/2015  . Seasonal allergies 01/27/2014  . Sprain of acromioclavicular joint 03/17/2015  . STD exposure 09/26/2017  . Substance abuse (HCC) 01/27/2014  . Substance abuse in remission (HCC)   . Testicular/scrotal pain 04/21/2015  . Tobacco abuse 03/22/2015  . URI 10/31/2010   Qualifier: Diagnosis of  By: [name] NP, Malvin Johns    . 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 Buprenorphine HCl-Naloxone HCl (SUBOXONE) 12-3 MG FILM Place under the tongue.    . cetirizine (ZYRTEC) 10 MG tablet Take 10 mg by mouth daily.    Marland Kitchen  cholecalciferol (VITAMIN D3) 25 MCG (1000 UNIT) tablet Take 2,000 Units by mouth daily.    Marland Kitchen elvitegravir-cobicistat-emtricitabine-tenofovir (GENVOYA) 150-150-200-10 MG TABS tablet Take 1 tablet by mouth daily with breakfast. Try to take at the same time each day with small amount of food 30 tablet 0  . fluticasone (FLONASE) 50 MCG/ACT nasal spray Place 2 sprays into both nostrils daily. (Patient not taking: Reported on 07/08/2020) 16 g 0  . gabapentin (NEURONTIN) 400 MG capsule Take 1 capsule po BID and 2 capsules po QHS 120 capsule  1  . ibuprofen (ADVIL) 600 MG tablet Take 1 tablet (600 mg total) by mouth every 6 (six) hours as needed for moderate pain. 30 tablet 0  . lamoTRIgine (LAMICTAL) 200 MG tablet TAKE (2) TABLETS BY MOUTH DAILY. 60 tablet 0  . lithium carbonate 150 MG capsule Continue 300 mg at bedtime for one week. Starting 08/02/20, decrease to 1 capsule at bedtime for 2 weeks, then discontinuing. 60 capsule 1  . MAGNESIUM PO Take by mouth.    . Multiple Vitamins-Minerals (CENTRUM MEN PO) Take by mouth.    . propranolol (INDERAL) 10 MG tablet Take 1-2 tabs po BID prn anxiety 120 tablet 1  . rosuvastatin (CRESTOR) 10 MG tablet TAKE 1 TABLET ONCE DAILY. 30 tablet 1  . sertraline (ZOLOFT) 50 MG tablet Take 1 tablet twice daily for 2 weeks, then 1/2 tablet in the morning and 1 tablet at bedtime for 2 weeks, then 1/2 tab twice daily for 2 weeks, then 1/2 tab at bedtime for 2 weeks, then stop 90 tablet 0   No facility-administered medications prior to visit.     Social History   Tobacco Use  . Smoking status: Former Smoker    Packs/day: 0.75    Years: 25.00    Pack years: 18.75    Types: Cigarettes    Quit date: 03/04/2016    Years since quitting: 4.8  . Smokeless tobacco: Never Used  Vaping Use  . Vaping Use: Every day  . Substances: Nicotine  Substance Use Topics  . Alcohol use: Yes    Alcohol/week: 21.0 standard drinks    Types: 21 Cans of beer per week  . Drug use: No    Comment: in recovery   Social History   Substance and Sexual Activity  Sexual Activity Yes  . Partners: Male  . Birth control/protection: Condom    Objective:  There were no vitals filed for this visit. There is no height or weight on file to calculate BMI.   Physical Exam  Lab Results Lab Results  Component Value Date   WBC 5.2 12/30/2020   HGB 14.5 12/30/2020   HCT 42.4 12/30/2020   MCV 92.8 12/30/2020   PLT 297 12/30/2020    Lab Results  Component Value Date   CREATININE 0.62 12/30/2020   BUN 14 12/30/2020    NA 140 12/30/2020   K 4.5 12/30/2020   CL 103 12/30/2020   CO2 28 12/30/2020    Lab Results  Component Value Date   ALT 40 12/30/2020   AST 26 12/30/2020   ALKPHOS 70 05/29/2016   BILITOT 0.4 12/30/2020    Lab Results  Component Value Date   CHOL 188 12/30/2020   HDL 38 (L) 12/30/2020   LDLCALC 129 (H) 12/30/2020   TRIG 104 12/30/2020   CHOLHDL 4.9 12/30/2020   HIV 1 RNA Quant  Date Value  12/30/2020 21 Copies/mL (H)  03/16/2020 <20 DETECTED copies/mL (A)  08/31/2019 <20 DETECTED copies/mL (  A)   CD4 T Cell Abs (/uL)  Date Value  03/16/2020 1,152  08/31/2019 844  12/22/2018 1,260   Lab Results  Component Value Date   HAV NEG 02/24/2010   Lab Results  Component Value Date   HEPBSAG NEG 02/24/2010   HEPBSAB INDETER (A) 02/24/2010   Lab Results  Component Value Date   HCVAB NEGATIVE 09/14/2014   Lab Results  Component Value Date   CHLAMYDIAWP Negative 04/09/2019   CHLAMYDIAWP Negative 04/09/2019   N Negative 04/09/2019   N Negative 04/09/2019     Assessment & Plan:   Patient Active Problem List   Diagnosis Date Noted  . Anxiety state 04/05/2016  . Decreased libido 04/05/2016  . Hyperlipidemia 04/21/2015  . Health care maintenance 02/15/2015  . Screening examination for venereal disease 09/30/2014  . Seasonal allergies 01/27/2014  . Hemorrhoid 09/29/2012  . Depression, major (HCC) 10/31/2010  . Human immunodeficiency virus (HIV) disease (HCC) 02/23/2010     Problem List Items Addressed This Visit      Unprioritized   Human immunodeficiency virus (HIV) disease (HCC) - Primary (Chronic)   Health care maintenance    Needs Menveo vaccine booster - given today          Rexene Alberts, MSN, NP-C [facility]  [email or web address] Pager: [phone] Office: [phone] [facility] Main Line: [phone]   01/13/2021  2:32 PM

## 2021-01-13 NOTE — Assessment & Plan Note (Deleted)
Needs Menveo vaccine booster - given today

## 2021-01-16 ENCOUNTER — Encounter: Payer: BC Managed Care – PPO | Admitting: Infectious Diseases

## 2021-01-16 DIAGNOSIS — Z0000 Encounter for general adult medical examination without abnormal findings: Secondary | ICD-10-CM

## 2021-01-16 DIAGNOSIS — B20 Human immunodeficiency virus [HIV] disease: Secondary | ICD-10-CM

## 2021-01-18 ENCOUNTER — Other Ambulatory Visit: Payer: Self-pay

## 2021-01-18 ENCOUNTER — Encounter: Payer: Self-pay | Admitting: Psychiatry

## 2021-01-18 ENCOUNTER — Ambulatory Visit (INDEPENDENT_AMBULATORY_CARE_PROVIDER_SITE_OTHER): Payer: BC Managed Care – PPO | Admitting: Psychiatry

## 2021-01-18 DIAGNOSIS — F331 Major depressive disorder, recurrent, moderate: Secondary | ICD-10-CM | POA: Diagnosis not present

## 2021-01-18 NOTE — Progress Notes (Signed)
      [facility] Counselor/Therapist Progress Note  Patient ID: Frank Webster, MRN: [id number],    Date: 01/18/2021  Time Spent: 45 minutes   Treatment Type: Individual Therapy  Reported Symptoms: sad, anxious  Mental Status Exam:  Appearance:   Casual     Behavior:  Appropriate  Motor:  Normal  Speech/Language:   Clear and Coherent  Affect:  Appropriate  Mood:  anxious and sad  Thought process:  normal  Thought content:    WNL  Sensory/Perceptual disturbances:    WNL  Orientation:  oriented to person, place, time/date and situation  Attention:  Good  Concentration:  Good  Memory:  WNL  Fund of knowledge:   Good  Insight:    Good  Judgment:   Good  Impulse Control:  Good   Risk Assessment: Danger to Self:  No Self-injurious Behavior: No Danger to Others: No Duty to Warn:no Physical Aggression / Violence:No  Access to Firearms a concern: No  Gang Involvement:No   Subjective: The client states that he was hired for the position at the [employer].  It is a remote position.  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  As the client processed using the bilateral stimulation hand paddles he did say that the [employer] fully expects to refund the position for another year.  It will give the client a great opportunity and a good skill set for working in other [employer].  His subjective units of distress went from a 7 to less than 1. The client ultimately wants to move to [place].  He is afraid that he will be stuck in [place] instead.  I suggested to the client that he make a list of goals and post them on his refrigerator.  It would also be helpful to put up notes around his condominium remembering that he is moving to [place] next year.  I explained that it was all about setting his  intention towards the next thing that he wanted to do.  The client agrees that he needs to believe in himself more.  We also discussed the fact that he would be working a remote position.  It is important that he practice more mood independent behavior to do activities that actively care for himself.  This includes exercise, getting out in the sun, creating a larger social network, sleep and nutrition.  I also encouraged the client to begin to network into professional organizations.  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    Interventions: Motivational Interviewing, Solution-Oriented/Positive Psychology, [employer] Desensitization and Reprocessing (EMDR) and Insight-Oriented  Diagnosis:   ICD-10-CM   1. Major depressive disorder, recurrent episode, moderate (HCC)  F33.1     Plan:  Mood independent behavior, positive self talk, self-care, exercise, sun exposure, network, create a larger social network, professional education.   Gelene Mink Zebulon Gantt, [facility]

## 2021-01-20 ENCOUNTER — Encounter: Payer: Self-pay | Admitting: Infectious Diseases

## 2021-01-20 ENCOUNTER — Ambulatory Visit (INDEPENDENT_AMBULATORY_CARE_PROVIDER_SITE_OTHER): Payer: BC Managed Care – PPO | Admitting: Infectious Diseases

## 2021-01-20 ENCOUNTER — Other Ambulatory Visit: Payer: Self-pay

## 2021-01-20 DIAGNOSIS — B20 Human immunodeficiency virus [HIV] disease: Secondary | ICD-10-CM

## 2021-01-20 DIAGNOSIS — F332 Major depressive disorder, recurrent severe without psychotic features: Secondary | ICD-10-CM | POA: Diagnosis not present

## 2021-01-20 DIAGNOSIS — R6882 Decreased libido: Secondary | ICD-10-CM | POA: Diagnosis not present

## 2021-01-20 NOTE — Patient Instructions (Addendum)
Nice to see you today!  Dr. Arita Miss or Dr. Marlou Porch for the urology concerns. I am hopeful they can let you switch to see one of them.   Will see you back in 6 - 8 months. Labs can be done prior to or during the visit if you prefer.

## 2021-01-20 NOTE — Progress Notes (Signed)
Name: Frank Webster   DOB: [birth year]   MRN: [id number]   PCP: Renford DillsPolite, Ronald, MD    SUBJECTIVE: Brief Narrative:  Frank Webster is a 45 y.o. male with HIV disease. Dx around 2000s. Intermittent ART at first.  OI Hx: none.  HIV Risk: MSM, previous drug use.   Previous Regimens:   Genvoya    Triumeq + Tivicay PM (DDI with Trileptel)  Triumeq >> suppressed   Genvoya >> switched to see if it helps with sleep.     Genotype:   01/2014 - no INSTI resistance, wild type    CC:  Routine HIV follow up care.  Poor sleep  Libido problems.    HPI: [facility] with concern that maybe the dolutegravir component was interfering with his sleep. After the switch he did report notable improvement in sleep quality and duration. He however now continues to have problems with sleep again. He has had to switch his antidepressant regimen with his psychiatrist several times for severe refractory depression with SI. Currently maintained on Pristiq once daily. 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   He does feel that since switching off dolutegravir he initially had a noted benefit in sleep quality so does feel that it helped a little at least at first. Now that his depression medication changed, however this has backtracked for him.   Saw urology re: low testosterone / low libido. Was told that testosterone is not low enough to require treatment and likely due to psychiatric medications. Open to seeing a different provider for another assessment. He also feels that there has been a new development/symptom - delayed ejaculation vs anejaculation. Can achieve orgasm, just has had at least one time a significant delay in ejaculate and very low volume. No pain, blood or other concern associated.     Review of Systems  Constitutional: Negative for chills, fever, malaise/fatigue and weight loss.  HENT:  Negative for sore throat.        No dental problems  Respiratory: Negative for cough and sputum production.   Cardiovascular: Negative for chest pain and leg swelling.  Gastrointestinal: Negative for abdominal pain, diarrhea and vomiting.  Genitourinary: Negative for dysuria, flank pain and urgency.       Sexual concerns mentioned above.   Musculoskeletal: Negative for joint pain, myalgias and neck pain.  Skin: Negative for rash.  Neurological: Negative for dizziness, tingling and headaches.  Psychiatric/Behavioral: Positive for depression. Negative for substance abuse and suicidal ideas. The patient has insomnia. The patient is not nervous/anxious.     Past Medical History:  Diagnosis Date  . Anxiety state 04/05/2016  . CANDIDIASIS, ORAL 10/11/2010   Qualifier: Diagnosis of  By: Sundra AlandFarrington NP, Malvin JohnsBradford    . Decreased libido 04/05/2016  . Depression, major (HCC) 10/31/2010   Qualifier: Diagnosis of  By: Sundra AlandFarrington NP, Malvin JohnsBradford    . Dermatophytosis of unspecified site 10/24/2010   Qualifier: Diagnosis of  By: Sundra AlandFarrington NP, Malvin JohnsBradford    . Erectile dysfunction 09/26/2017  . FOLLICULITIS 10/24/2010   Qualifier: Diagnosis of  By: Sundra AlandFarrington NP, Malvin JohnsBradford    . Glossitis 10/03/2010   Qualifier: Diagnosis of  By: Sundra AlandFarrington NP, Malvin JohnsBradford    . Hemorrhoid 09/29/2012  . HIV infection (HCC)   . Inflamed seborrheic keratosis 10/11/2010   Qualifier: Diagnosis of  By: Sundra AlandFarrington NP, Malvin JohnsBradford    . Left shoulder pain 03/17/2015  . Nonallopathic  lesion of cervical region 04/14/2015  . Nonallopathic lesion of lumbosacral region 04/14/2015  . Nonallopathic lesion of thoracic region 04/14/2015  . Other specified disorder of rectum and anus 10/03/2010   Qualifier: Diagnosis of  By: [name] NP, Malvin Johns    . OTITIS EXTERNA 06/14/2010   Qualifier: Diagnosis of  By: Philipp Deputy MD, Tresa Endo    . Pruritus ani 10/24/2010   Qualifier: Diagnosis of  By: [name] NP, Malvin Johns    . Racing heart beat 04/21/2015  . Scapular  dyskinesis 03/24/2015  . Seasonal allergies 01/27/2014  . Sprain of acromioclavicular joint 03/17/2015  . STD exposure 09/26/2017  . Substance abuse (HCC) 01/27/2014  . Substance abuse in remission (HCC)   . Testicular/scrotal pain 04/21/2015  . Tobacco abuse 03/22/2015  . URI 10/31/2010   Qualifier: Diagnosis of  By: [name] NP, Malvin Johns    . 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 Buprenorphine HCl-Naloxone HCl 12-3 MG FILM Place under the tongue.    . cetirizine (ZYRTEC) 10 MG tablet Take 10 mg by mouth daily.    . cholecalciferol (VITAMIN D3) 25 MCG (1000 UNIT) tablet Take 2,000 Units by mouth daily.    Marland Kitchen desvenlafaxine (PRISTIQ) 50 MG 24 hr tablet Take 50 mg by mouth daily.    Marland Kitchen gabapentin (NEURONTIN) 400 MG capsule Take 1 capsule po BID and 2 capsules po QHS 120 capsule 1  . ibuprofen (ADVIL) 600 MG tablet Take 1 tablet (600 mg total) by mouth every 6 (six) hours as needed for moderate pain. 30 tablet 0  . lamoTRIgine (LAMICTAL) 200 MG tablet TAKE (2) TABLETS BY MOUTH DAILY. 60 tablet 0  . loratadine (CLARITIN) 10 MG tablet Take 10 mg by mouth daily.    Marland Kitchen MAGNESIUM PO Take by mouth.    . Multiple Vitamins-Minerals (CENTRUM MEN PO) Take by mouth.    . elvitegravir-cobicistat-emtricitabine-tenofovir (GENVOYA) 150-150-200-10 MG TABS tablet Take 1 tablet by mouth daily with breakfast. Try to take at the same time each day with small amount of food 30 tablet 0  . rosuvastatin (CRESTOR) 10 MG tablet TAKE 1 TABLET ONCE DAILY. 30 tablet 1  . fluticasone (FLONASE) 50 MCG/ACT nasal spray Place 2 sprays into both nostrils daily. (Patient not taking: Reported on 01/20/2021) 16 g 0  . lithium carbonate 150 MG capsule Continue 300 mg at bedtime for one week. Starting 08/02/20, decrease to 1 capsule at bedtime for 2 weeks, then discontinuing. 60 capsule 1  .  propranolol (INDERAL) 10 MG tablet Take 1-2 tabs po BID prn anxiety (Patient not taking: Reported on 01/20/2021) 120 tablet 1  . sertraline (ZOLOFT) 50 MG tablet Take 1 tablet twice daily for 2 weeks, then 1/2 tablet in the morning and 1 tablet at bedtime for 2 weeks, then 1/2 tab twice daily for 2 weeks, then 1/2 tab at bedtime for 2 weeks, then stop (Patient not taking: Reported on 01/20/2021) 90 tablet 0   No facility-administered medications prior to visit.     Social History   Tobacco Use  . Smoking status: Light Tobacco Smoker    Packs/day: 0.75    Years: 25.00    Pack years: 18.75    Types: Cigarettes    Last attempt to quit: 03/04/2016    Years since quitting: 4.9  . Smokeless tobacco: Never Used  Vaping Use  . Vaping Use: Every  day  . Substances: Nicotine  Substance Use Topics  . Alcohol use: Yes    Alcohol/week: 21.0 standard drinks    Types: 21 Cans of beer per week  . Drug use: No    Comment: in recovery   Social History   Substance and Sexual Activity  Sexual Activity Yes  . Partners: Male  . Birth control/protection: Condom   Comment: declined condoms 01/2021    Objective:  Vitals:   01/20/21 0901  BP: 122/74  Pulse: 73  Temp: 98.1 F (36.7 C)  TempSrc: Oral  Weight: 159 lb (72.1 kg)   Body mass index is 24.18 kg/m.   Physical Exam HENT:     Head: Normocephalic.     Mouth/Throat:     Mouth: No oral lesions.     Dentition: Normal dentition. No dental caries.  Eyes:     General: No scleral icterus. Cardiovascular:     Rate and Rhythm: Normal rate and regular rhythm.     Heart sounds: Normal heart sounds.  Pulmonary:     Effort: Pulmonary effort is normal.     Breath sounds: Normal breath sounds.  Abdominal:     General: There is no distension.     Palpations: Abdomen is soft.     Tenderness: There is no abdominal tenderness.  Lymphadenopathy:     Cervical: No cervical adenopathy.  Skin:    General: Skin is warm and dry.     Findings: No  rash.  Neurological:     Mental Status: He is alert and oriented to person, place, and time.  Psychiatric:        Mood and Affect: Mood and affect normal.        Cognition and Memory: Memory normal.     Lab Results Lab Results  Component Value Date   WBC 5.2 12/30/2020   HGB 14.5 12/30/2020   HCT 42.4 12/30/2020   MCV 92.8 12/30/2020   PLT 297 12/30/2020    Lab Results  Component Value Date   CREATININE 0.62 12/30/2020   BUN 14 12/30/2020   NA 140 12/30/2020   K 4.5 12/30/2020   CL 103 12/30/2020   CO2 28 12/30/2020    Lab Results  Component Value Date   ALT 40 12/30/2020   AST 26 12/30/2020   ALKPHOS 70 05/29/2016   BILITOT 0.4 12/30/2020    Lab Results  Component Value Date   CHOL 188 12/30/2020   HDL 38 (L) 12/30/2020   LDLCALC 129 (H) 12/30/2020   TRIG 104 12/30/2020   CHOLHDL 4.9 12/30/2020   HIV 1 RNA Quant  Date Value  12/30/2020 21 Copies/mL (H)  03/16/2020 <20 DETECTED copies/mL (A)  08/31/2019 <20 DETECTED copies/mL (A)   CD4 T Cell Abs (/uL)  Date Value  03/16/2020 1,152  08/31/2019 844  12/22/2018 1,260   Lab Results  Component Value Date   HAV NEG 02/24/2010   Lab Results  Component Value Date   HEPBSAG NEG 02/24/2010   HEPBSAB INDETER (A) 02/24/2010   Lab Results  Component Value Date   HCVAB NEGATIVE 09/14/2014   Lab Results  Component Value Date   CHLAMYDIAWP Negative 04/09/2019   CHLAMYDIAWP Negative 04/09/2019   N Negative 04/09/2019   N Negative 04/09/2019     Problem List Items Addressed This Visit      Unprioritized   Human immunodeficiency virus (HIV) disease (HCC) (Chronic)    We switched his antiretroviral regimen recently due to concern that doluteravir was causing  some impact on his lack of sleep.  This regimen worked very well for him in the past and as long as he keeps [place] updated on his other psychiatric medications to ensure no drug interactions should be fine to continue.  His viral load was undetectable.  We  will continue to follow every 6 months for him. Vaccination records reviewed and no further recommendations today.      Depression, major (HCC)    Seems improved on Pristiq.  Follows with mental health team.      Relevant Medications   desvenlafaxine (PRISTIQ) 50 MG 24 hr tablet   Decreased libido    I suggested he call to see if he can schedule a visit with either Dr. Arita Miss or Dr. Marlou Porch to get another viewpoint of his problem especially in light of new symptoms regarding possible anejaculation or retrograde ejaculation.          Rexene Alberts, MSN, NP-C [facility]  [email or web address] Pager: [phone] Office: [phone] [facility] Main Line: [phone]   02/13/2021  3:50 PM

## 2021-01-22 ENCOUNTER — Other Ambulatory Visit: Payer: Self-pay | Admitting: Infectious Diseases

## 2021-01-22 DIAGNOSIS — E785 Hyperlipidemia, unspecified: Secondary | ICD-10-CM

## 2021-02-01 ENCOUNTER — Ambulatory Visit: Payer: BC Managed Care – PPO | Admitting: Psychiatry

## 2021-02-13 NOTE — Assessment & Plan Note (Signed)
I suggested he call to see if he can schedule a visit with either Dr. Arita Miss or Dr. Marlou Porch to get another viewpoint of his problem especially in light of new symptoms regarding possible anejaculation or retrograde ejaculation.

## 2021-02-13 NOTE — Assessment & Plan Note (Signed)
We switched his antiretroviral regimen recently due to concern that doluteravir was causing some impact on his lack of sleep.  This regimen worked very well for him in the past and as long as he keeps [place] updated on his other psychiatric medications to ensure no drug interactions should be fine to continue.  His viral load was undetectable.  We will continue to follow every 6 months for him. Vaccination records reviewed and no further recommendations today.

## 2021-02-13 NOTE — Assessment & Plan Note (Signed)
Seems improved on Pristiq.  Follows with mental health team.

## 2021-02-15 ENCOUNTER — Ambulatory Visit: Payer: Self-pay | Admitting: Psychiatry

## 2021-02-23 ENCOUNTER — Telehealth: Payer: Self-pay

## 2021-02-23 DIAGNOSIS — B20 Human immunodeficiency virus [HIV] disease: Secondary | ICD-10-CM

## 2021-02-23 MED ORDER — GENVOYA 150-150-200-10 MG PO TABS: ORAL_TABLET | ORAL | 4 refills | Status: DC

## 2021-02-23 NOTE — Telephone Encounter (Signed)
Patient called, states he has new insurance and Darrin Luis will need to be sent to [facility]. Resent prescription to [facility] and canceled with [facility].   Sandie Ano, RN

## 2021-03-06 ENCOUNTER — Telehealth: Payer: Self-pay

## 2021-03-06 NOTE — Telephone Encounter (Signed)
Spoke with patient, he states he is not on oxcarbazepine. Confirms he is on buprenorphine, desvenlafaxine, gabapentin, and lamotrigine. RN went through current medication list with patient, he confirms it is accurate except for the fact that he is no longer taking magnesium or Zyrtec.   RN faxed completed form stating patient is no longer on oxcarbazepine to [facility].   Sandie Ano, RN

## 2021-03-06 NOTE — Telephone Encounter (Signed)
Thank you for the care and attention to doing that  And yes continue the [facility]

## 2021-03-06 NOTE — Telephone Encounter (Signed)
Received fax from [facility] regarding possible medication interaction between Genvoya and Oxcarbazepine: "plasma concentrations of elvitegravir may be decreased by oxcarbazepine oral potentially leading to loss of antiretroviral effect and the development of resistance."   Will route to pharmacy and provider.   Sandie Ano, RN

## 2021-03-06 NOTE — Telephone Encounter (Signed)
He is not on Oxcarbazepine anymore (in fact has been of for several years I believe)  Can we call or send Frank Webster a message just to clarify?  He has had several psych med switches so we should make sure we don't overlook anything more current than my last convo with [name].

## 2021-07-13 ENCOUNTER — Other Ambulatory Visit: Payer: Self-pay

## 2021-07-13 DIAGNOSIS — Z113 Encounter for screening for infections with a predominantly sexual mode of transmission: Secondary | ICD-10-CM

## 2021-07-13 DIAGNOSIS — B20 Human immunodeficiency virus [HIV] disease: Secondary | ICD-10-CM

## 2021-07-20 ENCOUNTER — Other Ambulatory Visit: Payer: Self-pay | Admitting: Infectious Diseases

## 2021-07-20 DIAGNOSIS — B20 Human immunodeficiency virus [HIV] disease: Secondary | ICD-10-CM

## 2021-07-21 ENCOUNTER — Other Ambulatory Visit: Payer: BLUE CROSS/BLUE SHIELD

## 2021-07-21 ENCOUNTER — Other Ambulatory Visit: Payer: Self-pay

## 2021-07-21 DIAGNOSIS — B20 Human immunodeficiency virus [HIV] disease: Secondary | ICD-10-CM

## 2021-07-24 LAB — CBC WITH DIFFERENTIAL/PLATELET
Absolute Monocytes: 420 cells/uL (ref 200–950)
Basophils Absolute: 8 cells/uL (ref 0–200)
Basophils Relative: 0.2 %
Eosinophils Absolute: 109 cells/uL (ref 15–500)
Eosinophils Relative: 2.6 %
HCT: 40.8 % (ref 38.5–50.0)
Hemoglobin: 13.8 g/dL (ref 13.2–17.1)
Lymphs Abs: 2071 cells/uL (ref 850–3900)
MCH: 32.2 pg (ref 27.0–33.0)
MCHC: 33.8 g/dL (ref 32.0–36.0)
MCV: 95.3 fL (ref 80.0–100.0)
MPV: 9.2 fL (ref 7.5–12.5)
Monocytes Relative: 10 %
Neutro Abs: 1592 cells/uL (ref 1500–7800)
Neutrophils Relative %: 37.9 %
Platelets: 331 10*3/uL (ref 140–400)
RBC: 4.28 10*6/uL (ref 4.20–5.80)
RDW: 12.9 % (ref 11.0–15.0)
Total Lymphocyte: 49.3 %
WBC: 4.2 10*3/uL (ref 3.8–10.8)

## 2021-07-24 LAB — COMPLETE METABOLIC PANEL WITH GFR
AG Ratio: 2.1 (calc) (ref 1.0–2.5)
ALT: 38 U/L (ref 9–46)
AST: 27 U/L (ref 10–40)
Albumin: 4.6 g/dL (ref 3.6–5.1)
Alkaline phosphatase (APISO): 94 U/L (ref 36–130)
BUN: 12 mg/dL (ref 7–25)
CO2: 29 mmol/L (ref 20–32)
Calcium: 9.6 mg/dL (ref 8.6–10.3)
Chloride: 100 mmol/L (ref 98–110)
Creat: 0.73 mg/dL (ref 0.60–1.29)
Globulin: 2.2 g/dL (calc) (ref 1.9–3.7)
Glucose, Bld: 115 mg/dL — ABNORMAL HIGH (ref 65–99)
Potassium: 4.4 mmol/L (ref 3.5–5.3)
Sodium: 136 mmol/L (ref 135–146)
Total Bilirubin: 0.4 mg/dL (ref 0.2–1.2)
Total Protein: 6.8 g/dL (ref 6.1–8.1)
eGFR: 113 mL/min/{1.73_m2} (ref >=60)

## 2021-07-24 LAB — HIV-1 RNA QUANT-NO REFLEX-BLD
HIV 1 RNA Quant: NOT DETECTED Copies/mL
HIV-1 RNA Quant, Log: NOT DETECTED Log cps/mL

## 2021-07-24 LAB — T-HELPER CELLS (CD4) COUNT (NOT AT ARMC)
Absolute CD4: 1060 cells/uL (ref 490–1740)
CD4 T Helper %: 46 % (ref 30–61)
Total lymphocyte count: 2328 cells/uL (ref 850–3900)

## 2021-07-25 ENCOUNTER — Other Ambulatory Visit: Payer: Self-pay | Admitting: Infectious Diseases

## 2021-07-25 DIAGNOSIS — E785 Hyperlipidemia, unspecified: Secondary | ICD-10-CM

## 2021-08-04 ENCOUNTER — Other Ambulatory Visit: Payer: Self-pay | Admitting: Infectious Diseases

## 2021-08-04 ENCOUNTER — Encounter: Payer: BC Managed Care – PPO | Admitting: Infectious Diseases

## 2021-08-04 DIAGNOSIS — E785 Hyperlipidemia, unspecified: Secondary | ICD-10-CM

## 2021-08-04 NOTE — Telephone Encounter (Signed)
Please advise on refill.

## 2021-08-22 ENCOUNTER — Telehealth: Payer: Self-pay

## 2021-08-22 NOTE — Telephone Encounter (Signed)
Attempted to reach patient to schedule 2nd [employer] vaccination. Left patient a message to contact our office for scheduling. Valarie Cones

## 2021-09-25 ENCOUNTER — Other Ambulatory Visit: Payer: Self-pay | Admitting: Infectious Diseases

## 2021-09-25 DIAGNOSIS — B20 Human immunodeficiency virus [HIV] disease: Secondary | ICD-10-CM

## 2023-08-28 ENCOUNTER — Encounter: Payer: Self-pay | Admitting: Psychiatry
